# Patient Record
Sex: Female | Born: 1945
Health system: Southern US, Community
[De-identification: ages and names within clinical notes are randomized; demographics above are authoritative.]

## PROBLEM LIST (undated history)

## (undated) DIAGNOSIS — R112 Nausea with vomiting, unspecified: Secondary | ICD-10-CM

## (undated) DIAGNOSIS — Z9889 Other specified postprocedural states: Secondary | ICD-10-CM

## (undated) DIAGNOSIS — M1990 Unspecified osteoarthritis, unspecified site: Secondary | ICD-10-CM

## (undated) DIAGNOSIS — M1A9XX0 Chronic gout, unspecified, without tophus (tophi): Secondary | ICD-10-CM

## (undated) DIAGNOSIS — K900 Celiac disease: Secondary | ICD-10-CM

## (undated) DIAGNOSIS — J45909 Unspecified asthma, uncomplicated: Secondary | ICD-10-CM

## (undated) DIAGNOSIS — E785 Hyperlipidemia, unspecified: Secondary | ICD-10-CM

## (undated) DIAGNOSIS — I6200 Nontraumatic subdural hemorrhage, unspecified: Secondary | ICD-10-CM

## (undated) DIAGNOSIS — A048 Other specified bacterial intestinal infections: Secondary | ICD-10-CM

## (undated) DIAGNOSIS — I10 Essential (primary) hypertension: Secondary | ICD-10-CM

## (undated) DIAGNOSIS — M818 Other osteoporosis without current pathological fracture: Secondary | ICD-10-CM

## (undated) DIAGNOSIS — E119 Type 2 diabetes mellitus without complications: Secondary | ICD-10-CM

## (undated) DIAGNOSIS — E7800 Pure hypercholesterolemia, unspecified: Secondary | ICD-10-CM

## (undated) DIAGNOSIS — E1169 Type 2 diabetes mellitus with other specified complication: Secondary | ICD-10-CM

## (undated) DIAGNOSIS — E039 Hypothyroidism, unspecified: Secondary | ICD-10-CM

## (undated) DIAGNOSIS — E1140 Type 2 diabetes mellitus with diabetic neuropathy, unspecified: Secondary | ICD-10-CM

## (undated) HISTORY — PX: TONSILLECTOMY: SUR1361

## (undated) HISTORY — PX: KNEE ARTHROSCOPY: SUR90

## (undated) HISTORY — PX: APPENDECTOMY (OPEN): SHX54

## (undated) HISTORY — DX: Other osteoporosis without current pathological fracture: M81.8

## (undated) HISTORY — DX: Unspecified asthma, uncomplicated: J45.909

## (undated) HISTORY — DX: Chronic gout, unspecified, without tophus (tophi): M1A.9XX0

## (undated) HISTORY — PX: CHOLECYSTECTOMY: SHX55

## (undated) HISTORY — PX: OTHER SURGICAL HISTORY: SHX169

## (undated) HISTORY — DX: Nausea with vomiting, unspecified: R11.2

## (undated) HISTORY — PX: FOOT SURGERY: SHX648

## (undated) HISTORY — PX: HYSTERECTOMY: SHX81

## (undated) HISTORY — DX: Essential (primary) hypertension: I10

## (undated) HISTORY — DX: Type 2 diabetes mellitus without complications: E11.9

## (undated) HISTORY — DX: Type 2 diabetes mellitus with diabetic neuropathy, unspecified: E11.40

## (undated) HISTORY — DX: Type 2 diabetes mellitus with other specified complication: E11.69

## (undated) HISTORY — PX: THYROID SURGERY: SHX805

## (undated) HISTORY — DX: Hyperlipidemia, unspecified: E78.5

## (undated) HISTORY — PX: TUBAL LIGATION: SHX77

---

## 2009-12-22 ENCOUNTER — Emergency Department
Admission: EM | Admit: 2009-12-22 | Disposition: A | Discharge status: Home / Self Care | Payer: Self-pay | Source: Emergency Department | Admitting: Emergency Medicine

## 2009-12-23 LAB — CBC AND DIFFERENTIAL
BASOPHILS %: 0.2 % (ref 0.0–2.0)
Baso(Absolute): 0.02 10*3/uL (ref 0.00–0.20)
Eosinophils %: 1.9 % (ref 0.0–6.0)
Eosinophils Absolute: 0.16 10*3/uL (ref 0.10–0.30)
Hematocrit: 41.4 % (ref 27.0–49.5)
Hemoglobin: 14.3 g/dL (ref 11.7–15.5)
Immature Granulocytes #: 0.01 10*3/uL (ref 0.00–0.05)
Immature Granulocytes %: 0.1 % — ABNORMAL HIGH (ref 0.0–0.0)
Lymphocytes Absolute: 3.39 10*3/uL (ref 1.00–4.80)
Lymphocytes Relative: 39.5 % (ref 25.0–55.0)
MCH: 28.1 pg (ref 27.0–34.0)
MCHC: 34.5 g/dL (ref 32.0–36.0)
MCV: 81.3 fL (ref 80–100)
MPV: 9.8 fL (ref 9.0–13.0)
Monocytes Absolute: 0.7 10*3/uL (ref 0.10–1.20)
Monocytes Relative %: 8.2 % — ABNORMAL HIGH (ref 1.0–8.0)
Neutrophils Absolute: 4.31 10*3/uL (ref 1.80–7.70)
Neutrophils Relative %: 50.2 % (ref 49.0–69.0)
Nucleated RBC %: 0 /100WBC (ref 0.0–0.0)
Nucleted RBC #: 0 10*3/uL (ref 0.00–0.00)
Platelets: 310 10*3/uL (ref 150–400)
RBC: 5.09 M/uL (ref 3.80–5.40)
RDW: 13.6 % (ref 11.0–14.0)
WBC: 8.58 10*3/uL (ref 4.80–10.80)

## 2009-12-23 LAB — URIC ACID: Uric acid: 8.5 mg/dL — ABNORMAL HIGH (ref 2.0–6.1)

## 2011-12-07 ENCOUNTER — Ambulatory Visit (INDEPENDENT_AMBULATORY_CARE_PROVIDER_SITE_OTHER): Payer: Medicare Other | Admitting: Family

## 2011-12-07 ENCOUNTER — Encounter (INDEPENDENT_AMBULATORY_CARE_PROVIDER_SITE_OTHER): Payer: Self-pay

## 2011-12-07 VITALS — BP 135/89 | HR 81 | Temp 98.3°F | Resp 19

## 2011-12-07 DIAGNOSIS — J45909 Unspecified asthma, uncomplicated: Secondary | ICD-10-CM

## 2011-12-07 DIAGNOSIS — J45901 Unspecified asthma with (acute) exacerbation: Secondary | ICD-10-CM

## 2011-12-07 MED ORDER — ALBUTEROL SULFATE (2.5 MG/3ML) 0.083% IN NEBU
2.50 mg | INHALATION_SOLUTION | Freq: Once | RESPIRATORY_TRACT | Status: AC
Start: 2011-12-07 — End: ?

## 2011-12-07 MED ORDER — FLUTICASONE PROPIONATE HFA 110 MCG/ACT IN AERO
2.00 | INHALATION_SPRAY | Freq: Two times a day (BID) | RESPIRATORY_TRACT | Status: AC
Start: 2011-12-07 — End: 2012-12-06

## 2011-12-07 MED ORDER — PREDNISONE 50 MG PO TABS
50.00 mg | ORAL_TABLET | Freq: Every day | ORAL | Status: AC
Start: 2011-12-07 — End: 2011-12-12

## 2011-12-07 MED ORDER — IPRATROPIUM BROMIDE 0.02 % IN SOLN
0.50 mg | Freq: Once | RESPIRATORY_TRACT | Status: AC
Start: 2011-12-07 — End: ?

## 2011-12-07 MED ORDER — ALBUTEROL SULFATE HFA 108 (90 BASE) MCG/ACT IN AERS
2.00 | INHALATION_SPRAY | Freq: Four times a day (QID) | RESPIRATORY_TRACT | Status: AC | PRN
Start: 2011-12-07 — End: 2012-12-06

## 2011-12-07 NOTE — Progress Notes (Signed)
Subjective:       Patient ID: Jillian Cordova is a 66 y.o. female.    HPI Comments: Had been staying with family member who smokes cigars. She believes this was a big trigger for her asthma    Shortness of Breath  This is a recurrent (has asthmatic attacks about 1x per year) problem. The current episode started in the past 7 days (saturday and now progressively worse). The problem has been gradually worsening. Associated symptoms include coryza, a sore throat and wheezing. Pertinent negatives include no abdominal pain, chest pain, claudication, ear pain, fever, headaches, hemoptysis, leg pain, leg swelling, neck pain, orthopnea, PND, rash, rhinorrhea, sputum production, swollen glands, syncope or vomiting. The symptoms are aggravated by fumes and pollens. The patient has no known risk factors for DVT/PE. She has tried beta agonist inhalers for the symptoms. The treatment provided mild relief. Her past medical history is significant for allergies and asthma. There is no history of aspirin allergies, bronchiolitis, CAD, chronic lung disease, COPD, DVT, a heart failure, PE, pneumonia or a recent surgery.       The following portions of the patient's history were reviewed and updated as appropriate: allergies, current medications, past family history, past medical history, past social history, past surgical history and problem list.    Review of Systems   Constitutional: Negative.  Negative for fever.   HENT: Positive for sore throat. Negative for ear pain, rhinorrhea and neck pain.    Eyes: Negative.    Respiratory: Positive for shortness of breath and wheezing. Negative for hemoptysis and sputum production.    Cardiovascular: Negative.  Negative for chest pain, orthopnea, claudication, leg swelling, syncope and PND.   Gastrointestinal: Negative.  Negative for vomiting and abdominal pain.   Genitourinary: Negative.    Musculoskeletal: Negative.    Skin: Negative.  Negative for rash.   Neurological: Negative.  Negative for  headaches.   Hematological: Negative.    Psychiatric/Behavioral: Negative.            Objective:    Physical Exam   Constitutional: She is oriented to person, place, and time. She appears well-developed and well-nourished.   HENT:   Head: Normocephalic and atraumatic.   Right Ear: External ear normal.   Left Ear: External ear normal.   Mouth/Throat: Oropharynx is clear and moist.   Eyes: Pupils are equal, round, and reactive to light.   Neck: Normal range of motion.   Cardiovascular: Normal rate, regular rhythm and normal heart sounds.    Pulmonary/Chest: Effort normal and breath sounds normal. No respiratory distress. She has no wheezes. She has no rales. She exhibits no tenderness.        Initially patient was hoarse with some expiratory wheezing in most lung fields.  After combo atrovent/albuterol mdi, BBS clear   Neurological: She is alert and oriented to person, place, and time.   Skin: Skin is warm and dry.   Psychiatric: She has a normal mood and affect. Her behavior is normal. Judgment and thought content normal.           Assessment:        Jillian Cordova was seen today for shortness of breath.    Diagnoses and associated orders for this visit:    Asthma attack  - predniSONE (DELTASONE) 50 MG tablet; Take 1 tablet (50 mg total) by mouth daily.  - fluticasone (FLOVENT HFA) 110 MCG/ACT inhaler; Inhale 2 puffs into the lungs 2 (two) times daily.  - albuterol (PROVENTIL HFA) 108 (  90 BASE) MCG/ACT inhaler; Inhale 2 puffs into the lungs every 6 (six) hours as needed for Wheezing.    Other Orders  - lisinopril (PRINIVIL,ZESTRIL) 20 MG tablet; Take 20 mg by mouth daily.  - citalopram (CELEXA) 20 MG tablet; Take 20 mg by mouth daily.  - pravastatin (PRAVACHOL) 40 MG tablet; Take 40 mg by mouth daily.  - fexofenadine (ALLEGRA) 60 MG tablet; Take 60 mg by mouth 2 (two) times daily.  - hydrochlorothiazide (HYDRODIURIL) 12.5 MG tablet; Take 12.5 mg by mouth daily.  - allopurinol (ZYLOPRIM) 300 MG tablet; Take 300 mg by mouth  daily.  - aspirin 81 MG tablet; Take 81 mg by mouth daily.  - amLODIPine (NORVASC) 2.5 MG tablet; Take 2.5 mg by mouth daily.            Plan:       1.  Meds as rx, avoid smoke    2.  F/u with pcp

## 2011-12-07 NOTE — Patient Instructions (Signed)
ASTHMA [Adult]  Asthma is a disease where the small air passages within the lung go into spasm and restrict the flow of air. Inflammation and swelling of the airways cause further restriction. During an acute asthma attack, these factors cause difficulty breathing, wheezing, cough and chest tightness.    An asthma attack can be triggered by many things. Common triggers include the common cold, bronchitis, pneumonia, emotional upset and heavy exercise. In about half of adults with asthma, allergies to smoke, pollutants in the air, dust, mold, pollen and animal dander can cause an asthma attack. Skipping doses of daily asthma medicine can also bring on an asthma attack.  Asthma can be controlled with proper medicines and decreased exposure to known allergens.  HOME CARE:   Drink lots of water or other fluids (at least 10 glasses a day) during an attack. This will loosen lung secretions and make it easier to breathe. If you have heart or kidney disease, check with your doctor before you drink extra amounts of fluids.   Take prescribed medicine exactly at the times advised. If you have a hand-held inhaler or aerosol breathing medicine, do not use it more than once every four hours, unless told to do so. If prescribed an antibiotic or prednisone, take all of the medicine even if you are feeling better after a few days.   Do not smoke. Avoid being exposed to the smoke of others.   Some persons with asthma are allergic to aspirin and non-steroidal medicines like ibuprofen (Motrin, Advil) and naproxen (Aleve, Naprosyn). Use these with caution. Acetaminophen (Tylenol) is safe to use.  FOLLOW UP with your doctor, or as advised by our staff. Always bring all of your current medicines with you for your doctor to see.  [NOTE: If you are age 65 or older, or if you have chronic asthma or COPD, we recommend a PNEUMOCOCCAL VACCINATION every five years and a yearly INFLUENZAVACCINATION (FLU SHOT) every autumn. Ask your doctor  about this. If you had an X-ray or EKG (cardiogram), it will be reviewed by a specialist. You will be notified of any new findings that may affect your care.]  GET PROMPT MEDICAL ATTENTION if any of the following occur:   Increased wheezing or shortness of breath   Need to use your inhalers more often than usual without relief   Fever of 100.4F (38C) or higher, or as directed by your healthcare provider   Coughing up lots of dark-colored or bloody sputum (mucus)   Chest pain with each breath   You do not start to improve within 24 hours   2000-2011 [employer], [address], [place] [address]. All rights reserved. This information is not intended as a substitute for professional medical care. Always follow your healthcare professional's instructions.

## 2012-12-11 MED ORDER — ONDANSETRON HCL 4 MG/2ML IJ SOLN
INTRAMUSCULAR | Status: AC
Start: 2012-12-11 — End: ?
  Filled 2012-12-11: qty 2

## 2012-12-11 MED ORDER — PROPOFOL 10 MG/ML IV EMUL
INTRAVENOUS | Status: AC
Start: 2012-12-11 — End: ?
  Filled 2012-12-11: qty 20

## 2012-12-11 MED ORDER — ROCURONIUM BROMIDE 50 MG/5ML IV SOLN
INTRAVENOUS | Status: AC
Start: 2012-12-11 — End: ?
  Filled 2012-12-11: qty 5

## 2012-12-11 MED ORDER — METOCLOPRAMIDE HCL 5 MG/ML IJ SOLN
INTRAMUSCULAR | Status: AC
Start: 2012-12-11 — End: ?
  Filled 2012-12-11: qty 2

## 2012-12-11 MED ORDER — DEXAMETHASONE SODIUM PHOSPHATE 4 MG/ML IJ SOLN (WRAP)
INTRAMUSCULAR | Status: AC
Start: 2012-12-11 — End: ?
  Filled 2012-12-11: qty 2

## 2012-12-11 MED ORDER — FENTANYL CITRATE 0.05 MG/ML IJ SOLN
INTRAMUSCULAR | Status: AC
Start: 2012-12-11 — End: ?
  Filled 2012-12-11: qty 2

## 2012-12-11 MED ORDER — MIDAZOLAM HCL 2 MG/2ML IJ SOLN
INTRAMUSCULAR | Status: AC
Start: 2012-12-11 — End: ?
  Filled 2012-12-11: qty 2

## 2012-12-13 ENCOUNTER — Ambulatory Visit: Payer: Medicare (Managed Care)

## 2012-12-18 ENCOUNTER — Encounter: Payer: Self-pay | Admitting: Acute Care

## 2012-12-18 ENCOUNTER — Ambulatory Visit: Payer: Self-pay

## 2012-12-18 ENCOUNTER — Encounter
Admission: RE | Disposition: A | Discharge status: Home / Self Care | Payer: Self-pay | Source: Ambulatory Visit | Attending: Gastroenterology

## 2012-12-18 ENCOUNTER — Ambulatory Visit: Payer: Medicare Other | Admitting: Gastroenterology

## 2012-12-18 ENCOUNTER — Ambulatory Visit: Payer: Medicare (Managed Care) | Admitting: Acute Care

## 2012-12-18 ENCOUNTER — Ambulatory Visit
Admission: RE | Admit: 2012-12-18 | Discharge: 2012-12-18 | Disposition: A | Discharge status: Home / Self Care | Payer: Medicare (Managed Care) | Source: Ambulatory Visit | Attending: Gastroenterology | Admitting: Gastroenterology

## 2012-12-18 DIAGNOSIS — M810 Age-related osteoporosis without current pathological fracture: Secondary | ICD-10-CM | POA: Insufficient documentation

## 2012-12-18 DIAGNOSIS — K219 Gastro-esophageal reflux disease without esophagitis: Secondary | ICD-10-CM | POA: Insufficient documentation

## 2012-12-18 DIAGNOSIS — J45909 Unspecified asthma, uncomplicated: Secondary | ICD-10-CM | POA: Insufficient documentation

## 2012-12-18 DIAGNOSIS — K620 Anal polyp: Secondary | ICD-10-CM | POA: Insufficient documentation

## 2012-12-18 DIAGNOSIS — I10 Essential (primary) hypertension: Secondary | ICD-10-CM | POA: Insufficient documentation

## 2012-12-18 DIAGNOSIS — E785 Hyperlipidemia, unspecified: Secondary | ICD-10-CM | POA: Insufficient documentation

## 2012-12-18 DIAGNOSIS — K921 Melena: Secondary | ICD-10-CM | POA: Insufficient documentation

## 2012-12-18 DIAGNOSIS — K648 Other hemorrhoids: Secondary | ICD-10-CM | POA: Insufficient documentation

## 2012-12-18 HISTORY — DX: Other specified postprocedural states: Z98.890

## 2012-12-18 HISTORY — PX: COLONOSCOPY: SHX174

## 2012-12-18 HISTORY — DX: Hyperlipidemia, unspecified: E78.5

## 2012-12-18 HISTORY — DX: Unspecified osteoarthritis, unspecified site: M19.90

## 2012-12-18 SURGERY — DONT USE, USE 1094-COLONOSCOPY, DIAGNOSTIC (SCREENING)
Anesthesia: Anesthesia General | Site: Abdomen | Wound class: Clean Contaminated

## 2012-12-18 MED ORDER — LIDOCAINE HCL 2 % IJ SOLN
INTRAMUSCULAR | Status: AC
Start: 2012-12-18 — End: ?
  Filled 2012-12-18: qty 20

## 2012-12-18 MED ORDER — MIDAZOLAM HCL 2 MG/2ML IJ SOLN
INTRAMUSCULAR | Status: DC | PRN
Start: 2012-12-18 — End: 2012-12-18
  Administered 2012-12-18 (×2): 1 mg via INTRAVENOUS

## 2012-12-18 MED ORDER — LIDOCAINE HCL 2 % IJ SOLN
INTRAMUSCULAR | Status: DC | PRN
Start: 2012-12-18 — End: 2012-12-18
  Administered 2012-12-18: 50 mg

## 2012-12-18 MED ORDER — FENTANYL CITRATE 0.05 MG/ML IJ SOLN
INTRAMUSCULAR | Status: DC | PRN
Start: 2012-12-18 — End: 2012-12-18
  Administered 2012-12-18 (×2): 50 ug via INTRAVENOUS

## 2012-12-18 MED ORDER — PROPOFOL 10 MG/ML IV EMUL
INTRAVENOUS | Status: AC
Start: 2012-12-18 — End: ?
  Filled 2012-12-18: qty 20

## 2012-12-18 MED ORDER — PROPOFOL 10 MG/ML IV EMUL
INTRAVENOUS | Status: DC | PRN
Start: 2012-12-18 — End: 2012-12-18
  Administered 2012-12-18: 50 mg via INTRAVENOUS
  Administered 2012-12-18: 60 mg via INTRAVENOUS
  Administered 2012-12-18: 50 mg via INTRAVENOUS

## 2012-12-18 MED ORDER — MIDAZOLAM HCL 2 MG/2ML IJ SOLN
INTRAMUSCULAR | Status: AC
Start: 2012-12-18 — End: ?
  Filled 2012-12-18: qty 2

## 2012-12-18 MED ORDER — LACTATED RINGERS IV SOLN
INTRAVENOUS | Status: DC
Start: 2012-12-18 — End: 2012-12-18

## 2012-12-18 MED ORDER — FENTANYL CITRATE 0.05 MG/ML IJ SOLN
INTRAMUSCULAR | Status: AC
Start: 2012-12-18 — End: ?
  Filled 2012-12-18: qty 2

## 2012-12-18 SURGICAL SUPPLY — 17 items
FORCEPS BIOPSY L240 CM JUMBO MICROMESH (Instrument) ×1 IMPLANT
FORCEPS BIOPSY L240 CM JUMBO MICROMESH TEETH STREAMLINE CATHETER (Instrument) IMPLANT
FORCEPS BX SS JMB RJ 4 2.8MM 240CM STRL (Instrument) ×1
GLOVES EXAM NITRILE ETS LG NS (Glove) ×4 IMPLANT
GOWN ISL PP PE REG LG LF FULL BCK NK TIE (Gown) ×2 IMPLANT
GOWN ISOLATION REGULAR LARGE FULL BACK NECK TIE ELASTIC CUFF (Gown) ×2 IMPLANT
MASK FLUID SHIELD W WRAP (Personal Protection) ×2 IMPLANT
SOLN LUBRICATING JELLY 4.25OZ (Irrigation Solutions) ×2 IMPLANT
SPONGE GAUZE L4 IN X W4 IN 16 PLY (Dressing) ×1 IMPLANT
SPONGE GAUZE L4 IN X W4 IN 16 PLY MAXIMUM ABSORBENT USP TYPE VII (Dressing) ×1 IMPLANT
SPONGE GZE CTTN CRTY 4X4IN LF NS 16 PLY (Dressing) ×1
SYRINGE 50 ML GRADUATE NONPYROGENIC DEHP (Syringes, Needles) ×1 IMPLANT
SYRINGE 50 ML GRADUATE NONPYROGENIC DEHP FREE PVC FREE BD MEDICAL (Syringes, Needles) ×1 IMPLANT
SYRINGE MED 50ML LF STRL GRAD N-PYRG (Syringes, Needles) ×1
WATER STERILE PLASTIC POUR BOTTLE 1000 (Irrigation Solutions) ×1 IMPLANT
WATER STERILE PLASTIC POUR BOTTLE 1000 ML (Irrigation Solutions) ×1 IMPLANT
WATER STRL 1000ML PLS PR BTL LF (Irrigation Solutions) ×1

## 2012-12-18 NOTE — Anesthesia Postprocedure Evaluation (Signed)
Anesthesia Post Evaluation    Patient: Jillian Cordova    Procedures performed: Procedure(s) with comments:  COLONOSCOPY - COLONOSCOPY  ANES=MAC; Q1=N; BB=N,172    Anesthesia type: General TIVA    Patient location:Phase II PACU    Last vitals:   Filed Vitals:    12/18/12 1357   BP: 151/77   Pulse: 72   Temp: 98.9 F (37.2 C)   Resp: 18   SpO2: 96%       Post pain: Patient not complaining of pain, continue current therapy      Mental Status:awake    Respiratory Function: tolerating room air    Cardiovascular: stable    Nausea/Vomiting: patient not complaining of nausea or vomiting    Hydration Status: adequate    Post assessment: no apparent anesthetic complications

## 2012-12-18 NOTE — H&P (Signed)
GI PRE PROCEDURE NOTE    Proceduralist Comments:   Review of Systems and Past Medical / Surgical History performed: Yes     Indications:Gastrointestinal bleeding    Previous Adverse Reaction to Anesthesia or Sedation (if yes, describe): No    Physical Exam / Laboratory Data (If applicable)   Airway Classification: Class II    General: Alert and cooperative  Lungs: Lungs clear to auscultation  Cardiac: RRR, normal S1S2.    Abdomen: Soft, non tender. Normal active bowel sounds  Other:     No labs drawn    American Society of Anesthesiologists (ASA) Physical Status Classification:   ASA 2 - Patient with mild systemic disease with no functional limitations    Planned Sedation:   Deep sedation with anesthesia    Attestation:   Jillian Cordova has been reassessed immediately prior to the procedure and is an appropriate candidate for the planned sedation and procedure. Risks, benefits and alternatives to the planned procedure and sedation have been explained to the patient or guardian:  yes        Signed by: Lilia Pro

## 2012-12-18 NOTE — Transfer of Care (Signed)
Anesthesia Transfer of Care Note    Patient: Jillian Cordova    Procedures performed: Procedure(s) with comments:  COLONOSCOPY - COLONOSCOPY  ANES=MAC; Q1=N; BB=N,172    Anesthesia type: General TIVA    Patient location:Phase II PACU    Last vitals:   Filed Vitals:    12/18/12 1357   BP: 151/77   Pulse: 72   Temp: 98.9 F (37.2 C)   Resp: 18   SpO2: 96%       Post pain: Patient not complaining of pain, continue current therapy      Mental Status:awake    Respiratory Function: tolerating room air    Cardiovascular: stable    Nausea/Vomiting: patient not complaining of nausea or vomiting    Hydration Status: adequate    Post assessment: no apparent anesthetic complications

## 2012-12-18 NOTE — Anesthesia Preprocedure Evaluation (Addendum)
Anesthesia Evaluation    AIRWAY    Mallampati: III    TM distance: <3 FB  Neck ROM: limited  Mouth Opening:limited   CARDIOVASCULAR    cardiovascular exam normal, regular and normal       DENTAL    No notable dental hx     PULMONARY    pulmonary exam normal     OTHER FINDINGS                  Pre-evaluation Note Incomplete - DO NOT USE FOR CLINICAL DECISIONS    Anesthesia Plan    ASA 3   general                   intravenous induction

## 2012-12-18 NOTE — Discharge Instructions (Signed)
Colonoscopy Discharge Instructions  General Instructions:  1. Following sedation, your judgement, perception, and coordination are considered impaired. Even though you may feel awake and alert, you are considered legally intoxicated. Therefore, until the next morning;   Do not Drive   Do not operate appliances or equipment that requires reaction time (e.g.Stove, electrical tools, machinery)   Do not sign legal documents or be involved in important decisions.   Do not smoke if alone   Do not drink alcoholic beverages   Go directly home and rest for several hours before resuming your routine activities.   It is highly recommended to have a responsible adult stay with you for the next 24 hours    2. Tenderness, swelling or pain may occur at the IV site where you received sedation. If you experience this, apply warm soaks to the area. Notify your physician if this persists.    Instructions Specific To Procedures - Report To Physician Any Of The Following:    Colon/Sigmoidoscopy/Proctoscopy   1. Severe and persistent abdominal pain/bloating which does not subside within 2-3 hours   2. Large amount of rectal bleeding (some mucosal blood streaking may occur, especially if biopsy or polypectomy was done or if hemorrhoids are present.   3. Nausea/vomitting   4. Fevers/Chills within 24 hours after procedure. Temp>101deg F    If you have questions or problems contact your MD immediately. If you need immediate attention, call your MD, 911 and/or go to nearest emergency room.

## 2012-12-19 ENCOUNTER — Encounter: Payer: Self-pay | Admitting: Gastroenterology

## 2013-01-08 NOTE — Anesthesia Preprocedure Evaluation (Addendum)
Anesthesia Evaluation    AIRWAY    Mallampati: III    TM distance: <3 FB  Neck ROM: limited  Mouth Opening:limited   CARDIOVASCULAR    cardiovascular exam normal, regular and normal       DENTAL    No notable dental hx     PULMONARY    pulmonary exam normal     OTHER FINDINGS                      Anesthesia Plan    ASA 3     general                     intravenous induction   Detailed anesthesia plan: general IV  Monitors/Adjuncts: other    Post Op: other  Post op pain management: per surgeon    informed consent obtained    Plan discussed with CRNA.

## 2013-01-09 ENCOUNTER — Encounter: Payer: Self-pay | Admitting: Acute Care

## 2013-01-09 ENCOUNTER — Encounter
Admission: RE | Disposition: A | Discharge status: Home / Self Care | Payer: Self-pay | Source: Ambulatory Visit | Attending: Gastroenterology

## 2013-01-09 ENCOUNTER — Ambulatory Visit: Payer: Medicare (Managed Care) | Admitting: Acute Care

## 2013-01-09 ENCOUNTER — Ambulatory Visit: Payer: Medicare Other | Admitting: Gastroenterology

## 2013-01-09 ENCOUNTER — Ambulatory Visit
Admission: RE | Admit: 2013-01-09 | Discharge: 2013-01-09 | Disposition: A | Discharge status: Home / Self Care | Payer: Medicare (Managed Care) | Source: Ambulatory Visit | Attending: Gastroenterology | Admitting: Gastroenterology

## 2013-01-09 DIAGNOSIS — I10 Essential (primary) hypertension: Secondary | ICD-10-CM | POA: Insufficient documentation

## 2013-01-09 DIAGNOSIS — Z7982 Long term (current) use of aspirin: Secondary | ICD-10-CM | POA: Insufficient documentation

## 2013-01-09 DIAGNOSIS — K2980 Duodenitis without bleeding: Secondary | ICD-10-CM | POA: Insufficient documentation

## 2013-01-09 DIAGNOSIS — K219 Gastro-esophageal reflux disease without esophagitis: Secondary | ICD-10-CM | POA: Insufficient documentation

## 2013-01-09 DIAGNOSIS — Z79899 Other long term (current) drug therapy: Secondary | ICD-10-CM | POA: Insufficient documentation

## 2013-01-09 DIAGNOSIS — A048 Other specified bacterial intestinal infections: Secondary | ICD-10-CM | POA: Insufficient documentation

## 2013-01-09 DIAGNOSIS — K449 Diaphragmatic hernia without obstruction or gangrene: Secondary | ICD-10-CM | POA: Insufficient documentation

## 2013-01-09 DIAGNOSIS — K2990 Gastroduodenitis, unspecified, without bleeding: Secondary | ICD-10-CM | POA: Insufficient documentation

## 2013-01-09 DIAGNOSIS — E785 Hyperlipidemia, unspecified: Secondary | ICD-10-CM | POA: Insufficient documentation

## 2013-01-09 DIAGNOSIS — J45909 Unspecified asthma, uncomplicated: Secondary | ICD-10-CM | POA: Insufficient documentation

## 2013-01-09 HISTORY — PX: EGD: SHX3789

## 2013-01-09 SURGERY — DONT USE, USE 1095-ESOPHAGOGASTRODUODENOSCOPY (EGD), DIAGNOSTIC
Anesthesia: Anesthesia General | Site: Abdomen | Wound class: Clean Contaminated

## 2013-01-09 MED ORDER — SODIUM CHLORIDE 0.9 % IV SOLN
INTRAVENOUS | Status: DC
Start: 2013-01-09 — End: 2013-01-09

## 2013-01-09 MED ORDER — LACTATED RINGERS IV SOLN
INTRAVENOUS | Status: DC
Start: 2013-01-09 — End: 2013-01-09

## 2013-01-09 MED ORDER — FENTANYL CITRATE 0.05 MG/ML IJ SOLN
INTRAMUSCULAR | Status: AC
Start: 2013-01-09 — End: ?
  Filled 2013-01-09: qty 2

## 2013-01-09 MED ORDER — PROPOFOL 10 MG/ML IV EMUL
INTRAVENOUS | Status: DC | PRN
Start: 2013-01-09 — End: 2013-01-09
  Administered 2013-01-09: 40 mg via INTRAVENOUS

## 2013-01-09 MED ORDER — LIDOCAINE HCL 2 % IJ SOLN
INTRAMUSCULAR | Status: AC
Start: 2013-01-09 — End: ?
  Filled 2013-01-09: qty 20

## 2013-01-09 MED ORDER — PROPOFOL 10 MG/ML IV EMUL
INTRAVENOUS | Status: AC
Start: 2013-01-09 — End: ?
  Filled 2013-01-09: qty 20

## 2013-01-09 MED ORDER — FENTANYL CITRATE 0.05 MG/ML IJ SOLN
INTRAMUSCULAR | Status: DC | PRN
Start: 2013-01-09 — End: 2013-01-09
  Administered 2013-01-09: 100 ug via INTRAVENOUS

## 2013-01-09 SURGICAL SUPPLY — 33 items
BLOCK BITE MAXI 60FR LF STRD STRAP SDPRT (Procedure Accessories) ×1
BLOCK BITE OD60 FR STURDY STRAP SIDEPORT (Procedure Accessories) ×1 IMPLANT
BLOCK BITE OD60 FR STURDY STRAP SIDEPORT DENTAL RETENTION RIM MAXI (Procedure Accessories) ×1 IMPLANT
CATHETER BALLOON DILATATION CRE 2.8 MM (Dilator) IMPLANT
CATHETER BALLOON DILATATION CRE PEBAX (Balloon) IMPLANT
CATHETER OD15-16.5-18 MM ODSEC6 FR L180 CM CRE BALLOON DILATATION L8 (Dilator) IMPLANT
CATHETER OD15-16.5-18 MM ODSEC6 FR L180 CM CREâ„¢ BALLOON DILATATION L8 (Dilator) IMPLANT
CATHETER OD6 FR ODSEC12-13.5-15 MM L180 CM CRE BALLOON DILATATION L8 (Balloon) IMPLANT
CATHETER OD6 FR ODSEC12-13.5-15 MM L180 CM CREâ„¢ BALLOON DILATATION L8 (Balloon) IMPLANT
DILATOR ESCP PEBAX 2.8MM CRE 15-16.5-18 (Dilator)
DILATOR ESCP PEBAX CRE 6FR 12-13.5-15MM (Balloon) IMPLANT
FORCEPS BIOPSY L240 CM LARGE CAPACITY (Instrument) ×1 IMPLANT
FORCEPS BIOPSY L240 CM MICROMESH TEETH STREAMLINE CATHETER NEEDLE (Instrument) ×1 IMPLANT
FORCEPS BX SS LG CPC RJ 4 2.4MM 240CM (Instrument) ×1
GOWN ISL PP PE REG LG LF FULL BCK NK TIE (Gown) IMPLANT
GOWN ISOLATION REGULAR LARGE FULL BACK NECK TIE ELASTIC CUFF (Gown) ×2 IMPLANT
MASK FLUID SHIELD W WRAP (Personal Protection) ×3 IMPLANT
NEEDLE CARR-LOCKE INJECT 25GX5 (Needles) IMPLANT
PAD ELECTROSRG GRND REM W CRD (Procedure Accessories) IMPLANT
SNARE ESCP MIC CPTVTR 13MM 240IN STRL (GE Lab Supplies) IMPLANT
SNARE SMALL HEXAGON CAPTIVATOR STIFF ENDOSCOPIC POLYPECTOMY (GE Lab Supplies) IMPLANT
SOLN LUBRICATING JELLY 4.25OZ (Irrigation Solutions) ×2 IMPLANT
SPONGE GAUZE L4 IN X W4 IN 16 PLY (Dressing) ×1 IMPLANT
SPONGE GAUZE L4 IN X W4 IN 16 PLY MAXIMUM ABSORBENT USP TYPE VII (Dressing) ×1 IMPLANT
SPONGE GZE CTTN CRTY 4X4IN LF NS 16 PLY (Dressing) ×1
SYRINGE 50 ML GRADUATE NONPYROGENIC DEHP (Syringes, Needles) ×1 IMPLANT
SYRINGE 50 ML GRADUATE NONPYROGENIC DEHP FREE PVC FREE BD MEDICAL (Syringes, Needles) ×1 IMPLANT
SYRINGE INFL 60ML ALN II STRL GA DISP (Syringes, Needles)
SYRINGE INFLATION 60 ML GAUGE CRE (Syringes, Needles) IMPLANT
SYRINGE MED 50ML LF STRL GRAD N-PYRG (Syringes, Needles) ×1
WATER STERILE PLASTIC POUR BOTTLE 1000 (Irrigation Solutions) ×1 IMPLANT
WATER STERILE PLASTIC POUR BOTTLE 1000 ML (Irrigation Solutions) ×1 IMPLANT
WATER STRL 1000ML PLS PR BTL LF (Irrigation Solutions) ×1

## 2013-01-09 NOTE — H&P (Signed)
GI PRE PROCEDURE NOTE    Proceduralist Comments:   Review of Systems and Past Medical / Surgical History performed: Yes     Indications:GERD     Previous Adverse Reaction to Anesthesia or Sedation (if yes, describe): No    Physical Exam / Laboratory Data (If applicable)   Airway Classification: Class II    General: Alert and cooperative  Lungs: Lungs clear to auscultation  Cardiac: RRR, normal S1S2.    Abdomen: Soft, non tender. Normal active bowel sounds  Other:     No labs drawn    American Society of Anesthesiologists (ASA) Physical Status Classification:   ASA 2 - Patient with mild systemic disease with no functional limitations    Planned Sedation:   Deep sedation with anesthesia    Attestation:   Jillian Cordova has been reassessed immediately prior to the procedure and is an appropriate candidate for the planned sedation and procedure. Risks, benefits and alternatives to the planned procedure and sedation have been explained to the patient or guardian:  yes        Signed by: Lilia Pro

## 2013-01-09 NOTE — Anesthesia Postprocedure Evaluation (Signed)
Anesthesia Post Evaluation    Patient: Jillian Cordova    Procedures performed: Procedure(s) with comments:  EGD - EGD  ANES=MAC; Q1=N; BB=N,172    Anesthesia type: General TIVA    Patient location:Phase II PACU    Last vitals:   Filed Vitals:    01/09/13 1325   BP: 147/85   Pulse: 75   Temp: 98.2 F (36.8 C)   Resp: 18   SpO2: 97%       Post pain: Patient not complaining of pain, continue current therapy      Mental Status:awake    Respiratory Function: tolerating room air    Cardiovascular: stable    Nausea/Vomiting: patient not complaining of nausea or vomiting    Hydration Status: adequate    Post assessment: no apparent anesthetic complications

## 2013-01-09 NOTE — Discharge Instructions (Signed)
Endoscopy Discharge Instructions  General Instructions:  1. Following sedation, your judgement, perception, and coordination are considered impaired. Even though you may feel awake and alert, you are considered legally intoxicated. Therefore, until the next morning;   Do not Drive   Do not operate appliances or equipment that requires reaction time (e.g. stove, electrical tools, machinery)   Do not sign legal documents or be involved in important decisions.   Do not smoke if alone   Do not drink alcoholic beverages   Go directly home and rest for several hours before resuming your routine activities.   It is highly recommended to have a responsible adult stay with you for the next 24 hours    2. Tenderness, swelling or pain may occur at the IV site where you received sedation. If you experience this, apply warm soaks to the area. Notify your physician if this persists.    Instructions Specific To Procedures - Report To Physician Any Of The Following:    Upper Endoscopy, ERCP, Dilations   1. Pain in Chest   2. Nausea/vomitting   3. Fevers/Chills within 24 hours after procedure. Temp>101deg F   4. Severe and persistent abdominal pain and bloating     In Addition:   Mild throat soreness may follow this procedure. Warm salt water gargling or    lozenges of your choice will most likely relieve your discomfort or cold drinks and   popsicles.      :             Additional Discharge Instructions  Your diet after the procedure: {Gi diet instructions:110015}  Special Instructions: ***  Prescriptions given: {YES (DEF)/NO:21773}  Patient education literature given; {YES (DEF)/NO:21773}      If you have questions or problems contact your MD immediately. If you need immediate attention, call your MD, 911 and/or go to nearest emergency room.

## 2013-01-09 NOTE — Transfer of Care (Signed)
22Anesthesia Transfer of Care Note    Patient: Jillian Cordova    Procedures performed: Procedure(s) with comments:  EGD - EGD  ANES=MAC; Q1=N; BB=N,172    Anesthesia type: General TIVA    Patient location:Phase II PACU    Last vitals:   Filed Vitals:    01/09/13 1325   BP: 147/85   Pulse: 75   Temp: 98.2 F (36.8 C)   Resp: 18   SpO2: 97%       Post pain: Patient not complaining of pain, continue current therapy      Mental Status:awake    Respiratory Function: tolerating room air    Cardiovascular: stable    Nausea/Vomiting: patient not complaining of nausea or vomiting    Hydration Status: adequate    Post assessment: no apparent anesthetic complications

## 2013-01-10 ENCOUNTER — Encounter: Payer: Self-pay | Admitting: Gastroenterology

## 2013-12-14 ENCOUNTER — Encounter (INDEPENDENT_AMBULATORY_CARE_PROVIDER_SITE_OTHER): Payer: Self-pay | Admitting: Family Medicine

## 2013-12-14 ENCOUNTER — Ambulatory Visit (INDEPENDENT_AMBULATORY_CARE_PROVIDER_SITE_OTHER): Payer: Medicare (Managed Care) | Admitting: Family Medicine

## 2013-12-14 VITALS — BP 173/92 | HR 80 | Temp 98.5°F | Resp 16 | Ht 61.0 in | Wt 185.0 lb

## 2013-12-14 DIAGNOSIS — I10 Essential (primary) hypertension: Secondary | ICD-10-CM

## 2013-12-14 DIAGNOSIS — R062 Wheezing: Secondary | ICD-10-CM

## 2013-12-14 DIAGNOSIS — R059 Cough, unspecified: Secondary | ICD-10-CM

## 2013-12-14 MED ORDER — ALBUTEROL SULFATE (2.5 MG/3ML) 0.083% IN NEBU
2.50 mg | INHALATION_SOLUTION | RESPIRATORY_TRACT | Status: AC | PRN
Start: 2013-12-14 — End: 2014-12-14

## 2013-12-14 MED ORDER — GUAIFENESIN-CODEINE 100-10 MG/5ML PO SYRP
5.00 mL | ORAL_SOLUTION | Freq: Three times a day (TID) | ORAL | Status: AC | PRN
Start: 2013-12-14 — End: ?

## 2013-12-14 MED ORDER — AMLODIPINE BESYLATE 5 MG PO TABS
5.0000 mg | ORAL_TABLET | Freq: Every day | ORAL | Status: AC
Start: 2013-12-14 — End: ?

## 2013-12-14 NOTE — Progress Notes (Signed)
Subjective:       Patient ID: Jillian Cordova is a 68 y.o. female.    Hypertension  This is a chronic problem. The current episode started more than 1 year ago. The problem has been gradually worsening since onset. The problem is uncontrolled. Associated symptoms include headaches, malaise/fatigue and shortness of breath. Pertinent negatives include no anxiety, blurred vision, chest pain, neck pain, orthopnea, palpitations, peripheral edema, PND or sweats. Agents associated with hypertension include decongestants. Risk factors for coronary artery disease include obesity and post-menopausal state. Past treatments include ACE inhibitors. The current treatment provides mild improvement. There are no compliance problems.  There is no history of angina, kidney disease, CAD/MI, CVA, heart failure, left ventricular hypertrophy, PVD or retinopathy.   Cough  This is a new problem. The current episode started in the past 7 days. The problem has been gradually worsening. The problem occurs every few minutes. The cough is non-productive. Associated symptoms include headaches, myalgias, nasal congestion, shortness of breath and wheezing. Pertinent negatives include no chest pain, ear congestion, ear pain or sweats. The symptoms are aggravated by dust and pollens. She has tried a beta-agonist inhaler for the symptoms. The treatment provided mild relief. Her past medical history is significant for asthma and bronchitis. There is no history of bronchiectasis, COPD, emphysema or pneumonia.       The following portions of the patient's history were reviewed and updated as appropriate: allergies, current medications, past family history, past medical history, past social history, past surgical history and problem list.    Review of Systems   Constitutional: Positive for malaise/fatigue.   HENT: Negative for ear pain.    Eyes: Negative for blurred vision.   Respiratory: Positive for cough, shortness of breath and wheezing.     Cardiovascular: Negative for chest pain, palpitations, orthopnea and PND.   Musculoskeletal: Positive for myalgias. Negative for neck pain.   Neurological: Positive for headaches.           Objective:     Physical Exam   Vitals reviewed.  Constitutional: She is oriented to person, place, and time.   HENT:   Right Ear: Hearing and tympanic membrane normal.   Left Ear: Hearing and tympanic membrane normal.   Nose: Mucosal edema and rhinorrhea present.   Cardiovascular: Normal rate, regular rhythm and normal heart sounds.    No murmur heard.  Pulmonary/Chest: Effort normal. She has wheezes.   Abdominal: Soft. Bowel sounds are normal.   Musculoskeletal: Normal range of motion.   Neurological: She is alert and oriented to person, place, and time.   Skin: Skin is warm and dry. No erythema.     BP 173/92   Pulse 80   Temp(Src) 98.5 F (36.9 C) (Tympanic)   Resp 16   Ht 1.549 m (5\' 1" )   Wt 83.915 kg (185 lb)   BMI 34.97 kg/m2            Assessment:       1. HTN (hypertension)  amLODIPine (NORVASC) 5 MG tablet   2. Cough  guaiFENesin-codeine (ROBITUSSIN AC) 100-10 MG/5ML syrup   3. Wheezing  albuterol (PROVENTIL) (2.5 MG/3ML) 0.083% nebulizer solution            Plan:       1) HTN  Poorly controlled  EKG normal   Added Amlodipine 5 mg po daily   Low salt diet c/w monitor  Follow up with PCP on Tuesday     2) Cough with wheezing  Allergy related   C/w neb   Advice to just take antihistamine/ no decongestants  If the symptoms persists or worsens to come back or see PCP

## 2013-12-14 NOTE — Patient Instructions (Signed)
High Blood Pressure --Established    High Blood Pressure (Hypertension) is a chronic disease. The cause is unknown in most cases. It can usually be controlled with lifestyle changes and/or medicines. Symptoms of high blood pressure may include headache, dizziness, visual changes, chest pain and shortness of breath. Sometimes it causes no symptoms at all. However, even if there are no symptoms, untreated high blood pressure increases the risk of heart attack, also known as acute myocardial infarction, or AMI, and stroke. It is a serious health risk and should not be ignored.  A normal blood pressure is 120/80 or less. The first (top) number is the "systolic" pressure. The second (bottom) number is the "diastolic" pressure. Hypertension exists when either the top number is 140 or higher, OR the bottom number is 90 or higher on repeated measurements.  Home Care:  All patients with high blood pressure should do the following to lower their pressure. If you are on medicines, then these methods may reduce or eliminate your need for medicines in the future.  1. Begin a weight loss program if you are overweight.  2. Reduce your salt intake.   Avoid high salt foods (olives, pickles, smoked meats, salted potato chips, etc.).   Do not add salt to your food at the table.   Use only small amounts of salt when cooking.  3. Begin an exercise program. Discuss with your doctor what type of exercise program would be best for you. It doesn't have to be difficult. Even brisk walking for 20 minutes three times a week is a good form of exercise.  4. Avoid medicines which contain heart stimulants. This includes many cold and sinus decongestant pills and sprays as well as diet pills. Check the warnings about hypertension on the label. Stimulants such as amphetamine or cocaine could be lethal for someone with hypertension. Never take these.  5. Limit your caffeine intake or switch to caffeine-free products.  6. Stop smoking. If you are  a long-time smoker, this can be hard. Enroll in a stop-smoking program to improve your chance of success.  7. Learning how to handle stress better is an important part of any program to lower blood pressure. Learn about relaxation methods such as meditation, yoga or biofeedback.  8. If medicines were prescribed, take them exactly as directed. Missing doses may cause your blood pressure get out of control.  9. Consider buying an automatic blood pressure machine (available at most pharmacies). Use this to monitor your blood pressure at home and report the results to your doctor.  Follow Up:  Regular visits to your own physician for blood pressure checks and medicine adjustment is an important part of your care. Make a follow-up appointment as directed by our staff.  Get Prompt Medical Attention  if any of the following occur:   Chest pain or shortness of breath   Severe headache   Throbbing or rushing sound in the ears   Nosebleed   Sudden severe abdominal pain   Extreme drowsiness, confusion or fainting   Dizziness or vertigo (dizziness with spinning sensation)   Weakness of an arm or leg or one side of the face   Difficulty with speech or vision   2000-2014 [employer], [address], [place] [address]. All rights reserved. This information is not intended as a substitute for professional medical care. Always follow your healthcare professional's instructions.

## 2014-04-15 ENCOUNTER — Emergency Department
Admission: EM | Admit: 2014-04-15 | Discharge: 2014-04-15 | Disposition: A | Discharge status: Home / Self Care | Payer: Medicare (Managed Care) | Attending: Emergency Medical Services | Admitting: Emergency Medical Services

## 2014-04-15 ENCOUNTER — Emergency Department: Payer: Medicare (Managed Care)

## 2014-04-15 DIAGNOSIS — Z7982 Long term (current) use of aspirin: Secondary | ICD-10-CM | POA: Insufficient documentation

## 2014-04-15 DIAGNOSIS — R609 Edema, unspecified: Secondary | ICD-10-CM | POA: Insufficient documentation

## 2014-04-15 DIAGNOSIS — N289 Disorder of kidney and ureter, unspecified: Secondary | ICD-10-CM | POA: Insufficient documentation

## 2014-04-15 DIAGNOSIS — J45909 Unspecified asthma, uncomplicated: Secondary | ICD-10-CM | POA: Insufficient documentation

## 2014-04-15 DIAGNOSIS — E785 Hyperlipidemia, unspecified: Secondary | ICD-10-CM | POA: Insufficient documentation

## 2014-04-15 DIAGNOSIS — R600 Localized edema: Secondary | ICD-10-CM

## 2014-04-15 DIAGNOSIS — I10 Essential (primary) hypertension: Secondary | ICD-10-CM | POA: Insufficient documentation

## 2014-04-15 DIAGNOSIS — M129 Arthropathy, unspecified: Secondary | ICD-10-CM | POA: Insufficient documentation

## 2014-04-15 DIAGNOSIS — M818 Other osteoporosis without current pathological fracture: Secondary | ICD-10-CM | POA: Insufficient documentation

## 2014-04-15 HISTORY — DX: Celiac disease: K90.0

## 2014-04-15 HISTORY — DX: Nontraumatic subdural hemorrhage, unspecified: I62.00

## 2014-04-15 HISTORY — DX: Other specified bacterial intestinal infections: A04.8

## 2014-04-15 LAB — CBC AND DIFFERENTIAL
Basophils Absolute Automated: 0.03 10*3/uL (ref 0.00–0.20)
Basophils Automated: 0 %
Eosinophils Absolute Automated: 0.22 10*3/uL (ref 0.00–0.70)
Eosinophils Automated: 3 %
Hematocrit: 39.7 % (ref 37.0–47.0)
Hgb: 13.2 g/dL (ref 12.0–16.0)
Immature Granulocytes Absolute: 0.02 10*3/uL
Immature Granulocytes: 0 %
Lymphocytes Absolute Automated: 2.63 10*3/uL (ref 0.50–4.40)
Lymphocytes Automated: 33 %
MCH: 28.4 pg (ref 28.0–32.0)
MCHC: 33.2 g/dL (ref 32.0–36.0)
MCV: 85.4 fL (ref 80.0–100.0)
MPV: 10.1 fL (ref 9.4–12.3)
Monocytes Absolute Automated: 0.74 10*3/uL (ref 0.00–1.20)
Monocytes: 9 %
Neutrophils Absolute: 4.33 10*3/uL (ref 1.80–8.10)
Neutrophils: 54 %
Nucleated RBC: 0 /100 WBC (ref 0–1)
Platelets: 281 10*3/uL (ref 140–400)
RBC: 4.65 10*6/uL (ref 4.20–5.40)
RDW: 14 % (ref 12–15)
WBC: 7.95 10*3/uL (ref 3.50–10.80)

## 2014-04-15 LAB — URINALYSIS, REFLEX TO MICROSCOPIC EXAM IF INDICATED
Bilirubin, UA: NEGATIVE
Blood, UA: NEGATIVE
Glucose, UA: NEGATIVE
Ketones UA: NEGATIVE
Nitrite, UA: NEGATIVE
Protein, UR: NEGATIVE
Specific Gravity UA: 1.024 (ref 1.001–1.035)
Urine pH: 5 (ref 5.0–8.0)
Urobilinogen, UA: NORMAL mg/dL

## 2014-04-15 LAB — COMPREHENSIVE METABOLIC PANEL
ALT: 34 U/L (ref 0–55)
AST (SGOT): 21 U/L (ref 5–34)
Albumin/Globulin Ratio: 1.6 (ref 0.9–2.2)
Albumin: 4.1 g/dL (ref 3.5–5.0)
Alkaline Phosphatase: 42 U/L (ref 37–106)
Anion Gap: 10 (ref 5.0–15.0)
BUN: 29 mg/dL — ABNORMAL HIGH (ref 7–19)
Bilirubin, Total: 0.2 mg/dL (ref 0.2–1.2)
CO2: 22 mEq/L (ref 22–29)
Calcium: 9.5 mg/dL (ref 8.5–10.5)
Chloride: 107 mEq/L (ref 100–111)
Creatinine: 0.9 mg/dL (ref 0.6–1.0)
Globulin: 2.6 g/dL (ref 2.0–3.6)
Glucose: 101 mg/dL — ABNORMAL HIGH (ref 70–100)
Potassium: 3.8 mEq/L (ref 3.5–5.1)
Protein, Total: 6.7 g/dL (ref 6.0–8.3)
Sodium: 139 mEq/L (ref 136–145)

## 2014-04-15 LAB — IHS D-DIMER: D-Dimer: 0.36 ug/mL FEU (ref 0.00–0.51)

## 2014-04-15 LAB — GFR: EGFR: >60

## 2014-04-15 NOTE — ED Notes (Addendum)
Pt is a 68 yo female, alert and oriented, who is presenting to ED with complaint of bilateral foot swelling and discoloration with associated intermittent tingling x 1 week. Pt called PCP and requested referral to cardiology Brent Bulla). Pt reports dizziness in supine position. Pt able to ambulate independently. Pt daughter reports that pt has decreased pain sensation and cannot always report accurately. Pt denies SOB and CP. Pt also reports weight gain of 36-40 lbs over last 4-6 months, possibly r/t celiac (per endocrinologist). Pt in NAD.

## 2014-04-15 NOTE — ED Provider Notes (Addendum)
Physician/Midlevel provider first contact with patient: 04/15/14 1144         History     Chief Complaint   Patient presents with   . Foot Swelling       Patient notes intermittent swelling of the feet that improved with elevation going on for approximately the last week.  The patient is on a calcium channel blocker  She feels like the veins are more prominent in her feet with blue discoloration not of the entire foot but focally more so on the lateral aspect of the foot and distally.  There is no chest pain or shortness of breath there is no calf swelling    She notes weight gain of 30 pounds over 6 months.  She relates this secondary to chronic arthritis decreased activity and hormonal reasons    There is no fevers or chills is no specific joint swelling or pain  Room in signs and symptoms present for one week gradual onset  No other modifiers    Past Medical History   Diagnosis Date   . Gout, chronic    . Osteoporosis, idiopathic    . PONV (postoperative nausea and vomiting)    . Hypertensive disorder      controlled w meds   . Hyperlipidemia    . Asthma without status asthmaticus      well controlled   . Arthritis    . Celiac disease    . H. pylori infection    . Subdural bleeding        Past Surgical History   Procedure Laterality Date   . Appendectomy     . Hysterectomy     . Foot surgery     . Knee arthroscopy     . Cholecystectomy     . Tonsillectomy     . Arm surgery     . Colonoscopy  12/18/2012     Procedure: COLONOSCOPY;  Surgeon: Lilia Pro, MD;  Location: [facility];  Service: Gastroenterology;  Laterality: N/A;  COLONOSCOPY  ANES=MAC; Q1=N; BB=N,172   . Egd  01/09/2013     Procedure: EGD;  Surgeon: Lilia Pro, MD;  Location: [facility];  Service: Gastroenterology;  Laterality: N/A;  EGD  ANES=MAC; Q1=N; BB=N,172       Family History   Problem Relation Age of Onset   . Heart disease Mother    . Diabetes Mother    . Early death Mother 90     heart disease   . Heart disease Father    .  Diabetes Father    . Stroke Father    . Multiple sclerosis Sister    . Heart disease Brother    . Early death Brother 41     heart disease   . Celiac disease Daughter    . Heart disease Sister    . Multiple sclerosis Sister        Social  History   Substance Use Topics   . Smoking status: Never Smoker    . Smokeless tobacco: Never Used   . Alcohol Use: Yes      Comment: rarely       .     Allergies   Allergen Reactions   . Iodine Anaphylaxis   . Keflex [Cephalexin] Anaphylaxis   . Penicillins Anaphylaxis       Current/Home Medications    ALBUTEROL (PROVENTIL) (2.5 MG/3ML) 0.083% NEBULIZER SOLUTION    Take 3 mLs (2.5 mg total) by nebulization every 4 (  four) hours as needed for Wheezing or Shortness of Breath.    ALBUTEROL IN    Inhale into the lungs as needed.    ALLOPURINOL (ZYLOPRIM) 300 MG TABLET    Take 300 mg by mouth daily.    AMLODIPINE (NORVASC) 5 MG TABLET    Take 1 tablet (5 mg total) by mouth daily.    ASPIRIN 81 MG TABLET    Take 81 mg by mouth daily.    CALCIUM CARBONATE-VITAMIN D (CALCIUM + D PO)    Take 600 mg by mouth 2 (two) times daily before meals.    CHOLECALCIFEROL (VITAMIN D3) 50000 UNITS CAPS    Take 50,000 Units by mouth every mon, wed and fri.       CITALOPRAM (CELEXA) 20 MG TABLET    Take 20 mg by mouth daily.    FEXOFENADINE (ALLEGRA) 60 MG TABLET    Take 60 mg by mouth 2 (two) times daily.    GUAIFENESIN-CODEINE (ROBITUSSIN AC) 100-10 MG/5ML SYRUP    Take 5 mLs by mouth 3 (three) times daily as needed for Cough.    LACTOBACILLUS (ACIDOPHILUS PO)    Take by mouth.    LISINOPRIL (PRINIVIL,ZESTRIL) 20 MG TABLET    Take 40 mg by mouth daily.     MAGNESIUM 200 MG TABS    Take 1,500 mg by mouth daily.       PRAVASTATIN (PRAVACHOL) 40 MG TABLET    Take 40 mg by mouth daily.    PROLIA 60 MG/ML SOLUTION SUBCUTANEOUS INJECTION    January and June          Review of Systems   Constitutional: Negative for fever.   HENT: Negative for congestion.    Respiratory: Negative for shortness of breath.     Cardiovascular: Positive for leg swelling (feet). Negative for chest pain.   Gastrointestinal: Positive for diarrhea. Negative for abdominal pain.   Genitourinary: Negative for dysuria.   Musculoskeletal: Negative.    Skin: Negative for rash.   Neurological: Negative for headaches.   Psychiatric/Behavioral: Negative for dysphoric mood.   All other systems reviewed and are negative.      Physical Exam    BP: 143/74 mmHg, Heart Rate: 83, Temp: 97.5 F (36.4 C), Resp Rate: 20, SpO2: 97 %, Weight: 87.091 kg     Physical Exam   Constitutional:   Well Appearing   HENT:   Head: Normocephalic.   Eyes: Conjunctivae are normal.   Neck:   Normal Inspection   Cardiovascular: Normal rate, regular rhythm and normal heart sounds.    Strong DP and PT pulses bilaterally normal cap refill   Pulmonary/Chest: Breath sounds normal.   Abdominal: Soft. Bowel sounds are normal. There is no tenderness.   Musculoskeletal: She exhibits no edema (Edema minimal edema bilateral feet) or tenderness.   Normal Upper Extremity Inspection   Neurological: She is alert. She has normal strength.   Skin: Skin is warm and dry.   Psychiatric: She has a normal mood and affect.       MDM and ED Course     ED Medication Orders     None              MDM    advise the patient on mild renal insufficiency recommend stockings elevation.  Consider referral to nephrologist if labs are different than baseline.  The patient has no signs and symptoms consistent with congestive heart failure has a clear lung exam clear x-ray edema is located  only in the feet    EKG is normal sinus rhythm without acute changes    Procedures    Clinical Impression & Disposition     Clinical Impression  Final diagnoses:   Edema of foot   Renal insufficiency        ED Disposition     Discharge Izetta Dakin discharge to home/self care.    Condition at disposition: Stable             New Prescriptions    No medications on file               Chase Picket, MD  04/15/14  1230    Chase Picket, MD  04/15/14 [phone]

## 2014-04-15 NOTE — Discharge Instructions (Signed)
Peripheral Edema NOS     You were diagnosed with peripheral edema.    Peripheral edema happens when a part of your body, most often your legs, starts to get swollen. The liquid that is supposed to be in your veins and arteries leaks out of them. It then enters the space between your muscles and fat cells. Peripheral edema is often painless, but it can be uncomfortable if the skin gets too tightly stretched.     A diagnosis of peripheral edema comes from a physical exam made by a doctor. Many things can cause peripheral edema. The most common are:     Congestive heart failure (inability of the heart to pump enough blood to your body).   Damage to your kidneys.   Injuries to a body part.   Liver failure.   Certain medicines.   Malnutrition (not eating enough nutritious food).    Pregnancy.     You can also get peripheral edema when you have been sitting or standing still for a long time. This can happen when you are in the hospital, during surgery, or during long car and plane trips. Your doctor might have done some blood work to see how your heart, liver, and kidneys are working. The doctor might have also done a chest x-ray or checked the pumping of your heart (EKG).     Swelling in the arms and hands is rare. This is often a sign of a more serious problem. Some examples are:      A squeezing of the superior vena cava. This is the big vein in your chest that gets blood from your head and arms.   A liver problem that lowers the amount of protein in your blood.    The tests done today did not find a clear cause for your problem. You will need more tests to figure out what the cause is.    You are safe to go home. It is important to take medicines exactly as you are instructed.     We don't believe your condition is serious right now. But, it is important to be careful. Sometimes a problem that seems small can get serious later. This is why it is very important to come back here or go to the nearest  Emergency Department if you don't get better or if your symptoms get worse.    When you get home, take all medicines as you were instructed. Avoid salt and extra fluids if your peripheral edema is caused by your heart. If your peripheral edema is serious, you might have been started on a diuretic. A diuretic, also called a "water pill," is a medicine that helps the kidneys remove extra water from your body.     Keep all appointments with your doctor. Following up with your doctor or the referral doctor is very important.    YOU SHOULD SEEK MEDICAL ATTENTION IMMEDIATELY, EITHER HERE OR AT THE NEAREST EMERGENCY DEPARTMENT, IF ANY OF THE FOLLOWING OCCUR:     You have vomiting (throwing up) that stops you from keeping any fluids in your stomach.   You have a faster heart rate. You feel lightheaded, or you pass out (lose consciousness).   You are vomiting blood or you have serious chest pain after vomiting.   You have a fever (temperature higher than 100.4F or 38C), chills, or abdominal (belly) pain.    If you can't talk with your doctor, or if you feel you need to be rechecked, come   back here or go to the nearest emergency department.

## 2014-04-16 LAB — ECG 12-LEAD
Atrial Rate: 72 {beats}/min
P Axis: 42 degrees
P-R Interval: 162 ms
Q-T Interval: 424 ms
QRS Duration: 90 ms
QTC Calculation (Bezet): 464 ms
R Axis: 19 degrees
T Axis: 56 degrees
Ventricular Rate: 72 {beats}/min

## 2015-09-03 ENCOUNTER — Other Ambulatory Visit: Payer: Self-pay | Admitting: Orthopaedic Surgery

## 2015-09-23 ENCOUNTER — Emergency Department (HOSPITAL_BASED_OUTPATIENT_CLINIC_OR_DEPARTMENT_OTHER): Payer: Medicare HMO

## 2015-09-23 ENCOUNTER — Emergency Department (HOSPITAL_BASED_OUTPATIENT_CLINIC_OR_DEPARTMENT_OTHER)
Admission: EM | Admit: 2015-09-23 | Discharge: 2015-09-23 | Disposition: A | Discharge status: Home / Self Care | Payer: Medicare HMO | Attending: Emergency Medicine | Admitting: Emergency Medicine

## 2015-09-23 ENCOUNTER — Encounter (HOSPITAL_BASED_OUTPATIENT_CLINIC_OR_DEPARTMENT_OTHER): Payer: Self-pay | Admitting: Emergency Medicine

## 2015-09-23 DIAGNOSIS — E039 Hypothyroidism, unspecified: Secondary | ICD-10-CM | POA: Diagnosis not present

## 2015-09-23 DIAGNOSIS — I10 Essential (primary) hypertension: Secondary | ICD-10-CM | POA: Diagnosis not present

## 2015-09-23 DIAGNOSIS — Y998 Other external cause status: Secondary | ICD-10-CM | POA: Diagnosis not present

## 2015-09-23 DIAGNOSIS — W540XXA Bitten by dog, initial encounter: Secondary | ICD-10-CM | POA: Insufficient documentation

## 2015-09-23 DIAGNOSIS — Y9389 Activity, other specified: Secondary | ICD-10-CM | POA: Diagnosis not present

## 2015-09-23 DIAGNOSIS — Z880 Allergy status to penicillin: Secondary | ICD-10-CM | POA: Insufficient documentation

## 2015-09-23 DIAGNOSIS — Z7984 Long term (current) use of oral hypoglycemic drugs: Secondary | ICD-10-CM | POA: Diagnosis not present

## 2015-09-23 DIAGNOSIS — Z79899 Other long term (current) drug therapy: Secondary | ICD-10-CM | POA: Insufficient documentation

## 2015-09-23 DIAGNOSIS — M1990 Unspecified osteoarthritis, unspecified site: Secondary | ICD-10-CM | POA: Insufficient documentation

## 2015-09-23 DIAGNOSIS — S61012A Laceration without foreign body of left thumb without damage to nail, initial encounter: Secondary | ICD-10-CM

## 2015-09-23 DIAGNOSIS — S6992XA Unspecified injury of left wrist, hand and finger(s), initial encounter: Secondary | ICD-10-CM | POA: Diagnosis present

## 2015-09-23 DIAGNOSIS — Y9289 Other specified places as the place of occurrence of the external cause: Secondary | ICD-10-CM | POA: Diagnosis not present

## 2015-09-23 DIAGNOSIS — Z794 Long term (current) use of insulin: Secondary | ICD-10-CM | POA: Diagnosis not present

## 2015-09-23 DIAGNOSIS — E119 Type 2 diabetes mellitus without complications: Secondary | ICD-10-CM | POA: Diagnosis not present

## 2015-09-23 DIAGNOSIS — Z7982 Long term (current) use of aspirin: Secondary | ICD-10-CM | POA: Diagnosis not present

## 2015-09-23 DIAGNOSIS — E7800 Pure hypercholesterolemia, unspecified: Secondary | ICD-10-CM | POA: Insufficient documentation

## 2015-09-23 DIAGNOSIS — Z23 Encounter for immunization: Secondary | ICD-10-CM | POA: Insufficient documentation

## 2015-09-23 HISTORY — DX: Essential (primary) hypertension: I10

## 2015-09-23 HISTORY — DX: Hypothyroidism, unspecified: E03.9

## 2015-09-23 HISTORY — DX: Pure hypercholesterolemia, unspecified: E78.00

## 2015-09-23 HISTORY — DX: Type 2 diabetes mellitus without complications: E11.9

## 2015-09-23 HISTORY — DX: Unspecified osteoarthritis, unspecified site: M19.90

## 2015-09-23 MED ORDER — LEVOFLOXACIN 500 MG PO TABS: 500.0000 mg | ORAL_TABLET | Freq: Every day | ORAL | Status: AC

## 2015-09-23 MED ORDER — LIDOCAINE HCL (PF) 1 % IJ SOLN
30.0000 mL | Freq: Once | INTRAMUSCULAR | Status: AC
  Administered 2015-09-23: 5 mL
  Filled 2015-09-23: qty 30

## 2015-09-23 MED ORDER — CLINDAMYCIN HCL 150 MG PO CAPS
450.0000 mg | ORAL_CAPSULE | Freq: Three times a day (TID) | ORAL | Status: AC
Start: 2015-09-23 — End: 2015-09-29

## 2015-09-23 MED ORDER — TETANUS-DIPHTH-ACELL PERTUSSIS 5-2.5-18.5 LF-MCG/0.5 IM SUSP
0.5000 mL | Freq: Once | INTRAMUSCULAR | Status: AC
  Administered 2015-09-23: 0.5 mL via INTRAMUSCULAR
  Filled 2015-09-23: qty 0.5

## 2015-09-23 NOTE — ED Notes (Signed)
Pt bit by her own dog.  Pt has bite to left thumb.  Pt states she was reaching to help her dog and she bit around her thumb.  Dressing in place.  Sent by PCP for eval.

## 2015-09-23 NOTE — ED Notes (Signed)
Patient ambulatory and bulky dressing applied to wound prior to discharge.

## 2015-09-23 NOTE — Discharge Instructions (Signed)
Suture removal in 5-7 days by PCP. Watch for signs of infection. Take antibiotics as prescribed  Sutured Wound Care Sutures are stitches that can be used to close wounds. Taking care of your wound properly can help to prevent pain and infection. It can also help your wound to heal more quickly. HOW TO CARE FOR YOUR SUTURED WOUND Wound Care  Keep the wound clean and dry.  If you were given a bandage (dressing), you should change it at least once per day or as directed by your health care provider. You should also change it if it becomes wet or dirty.  Keep the wound completely dry for the first 24 hours or as directed by your health care provider. After that time, you may shower or bathe. However, make sure that the wound is not soaked in water until the sutures have been removed.  Clean the wound one time each day or as directed by your health care provider.  Wash the wound with soap and water.  Rinse the wound with water to remove all soap.  Pat the wound dry with a clean towel. Do not rub the wound.  Aftercleaning the wound, apply a thin layer of antibioticointment as directed by your health care provider. This will help to prevent infection and keep the dressing from sticking to the wound.  Have the sutures removed as directed by your health care provider. General Instructions  Take or apply medicines only as directed by your health care provider.  To help prevent scarring, make sure to cover your wound with sunscreen whenever you are outside after the sutures are removed and the wound is healed. Make sure to wear a sunscreen of at least 30 SPF.  If you were prescribed an antibiotic medicine or ointment, finish all of it even if you start to feel better.  Do not scratch or pick at the wound.  Keep all follow-up visits as directed by your health care provider. This is important.  Check your wound every day for signs of infection. Watch for:   Redness, swelling, or  pain.  Fluid, blood, or pus.  Raise (elevate) the injured area above the level of your heart while you are sitting or lying down, if possible.  Avoid stretching your wound.  Drink enough fluids to keep your urine clear or pale yellow. SEEK MEDICAL CARE IF:  You received a tetanus shot and you have swelling, severe pain, redness, or bleeding at the injection site.  You have a fever.  A wound that was closed breaks open.  You notice a bad smell coming from the wound.  You notice something coming out of the wound, such as wood or glass.  Your pain is not controlled with medicine.  You have increased redness, swelling, or pain at the site of your wound.  You have fluid, blood, or pus coming from your wound.  You notice a change in the color of your skin near your wound.  You need to change the dressing frequently due to fluid, blood, or pus draining from the wound.  You develop a new rash.  You develop numbness around the wound. SEEK IMMEDIATE MEDICAL CARE IF:  You develop severe swelling around the injury site.  Your pain suddenly increases and is severe.  You develop painful lumps near the wound or on skin that is anywhere on your body.  You have a red streak going away from your wound.  The wound is on your hand or foot and you  cannot properly move a finger or toe.  The wound is on your hand or foot and you notice that your fingers or toes look pale or bluish.   This information is not intended to replace advice given to you by your health care provider. Make sure you discuss any questions you have with your health care provider.   Document Released: 08/17/2004 Document Revised: 11/24/2014 Document Reviewed: 02/19/2013 Elsevier Interactive Patient Education 2016 [employer]  Laceration Care, Adult A laceration is a cut that goes through all layers of the skin. The cut also goes into the tissue that is right under the skin. Some cuts heal on their own. Others  need to be closed with stitches (sutures), staples, skin adhesive strips, or wound glue. Taking care of your cut lowers your risk of infection and helps your cut to heal better. HOW TO TAKE CARE OF YOUR CUT For stitches or staples:  Keep the wound clean and dry.  If you were given a bandage (dressing), you should change it at least one time per day or as told by your doctor. You should also change it if it gets wet or dirty.  Keep the wound completely dry for the first 24 hours or as told by your doctor. After that time, you may take a shower or a bath. However, make sure that the wound is not soaked in water until after the stitches or staples have been removed.  Clean the wound one time each day or as told by your doctor:  Wash the wound with soap and water.  Rinse the wound with water until all of the soap comes off.  Pat the wound dry with a clean towel. Do not rub the wound.  After you clean the wound, put a thin layer of antibiotic ointment on it as told by your doctor. This ointment:  Helps to prevent infection.  Keeps the bandage from sticking to the wound.  Have your stitches or staples removed as told by your doctor. If your doctor used skin adhesive strips:   Keep the wound clean and dry.  If you were given a bandage, you should change it at least one time per day or as told by your doctor. You should also change it if it gets dirty or wet.  Do not get the skin adhesive strips wet. You can take a shower or a bath, but be careful to keep the wound dry.  If the wound gets wet, pat it dry with a clean towel. Do not rub the wound.  Skin adhesive strips fall off on their own. You can trim the strips as the wound heals. Do not remove any strips that are still stuck to the wound. They will fall off after a while. If your doctor used wound glue:  Try to keep your wound dry, but you may briefly wet it in the shower or bath. Do not soak the wound in water, such as by  swimming.  After you take a shower or a bath, gently pat the wound dry with a clean towel. Do not rub the wound.  Do not do any activities that will make you really sweaty until the skin glue has fallen off on its own.  Do not apply liquid, cream, or ointment medicine to your wound while the skin glue is still on.  If you were given a bandage, you should change it at least one time per day or as told by your doctor. You should also change  it if it gets dirty or wet.  If a bandage is placed over the wound, do not let the tape for the bandage touch the skin glue.  Do not pick at the glue. The skin glue usually stays on for 5-10 days. Then, it falls off of the skin. General Instructions  To help prevent scarring, make sure to cover your wound with sunscreen whenever you are outside after stitches are removed, after adhesive strips are removed, or when wound glue stays in place and the wound is healed. Make sure to wear a sunscreen of at least 30 SPF.  Take over-the-counter and prescription medicines only as told by your doctor.  If you were given antibiotic medicine or ointment, take or apply it as told by your doctor. Do not stop using the antibiotic even if your wound is getting better.  Do not scratch or pick at the wound.  Keep all follow-up visits as told by your doctor. This is important.  Check your wound every day for signs of infection. Watch for:  Redness, swelling, or pain.  Fluid, blood, or pus.  Raise (elevate) the injured area above the level of your heart while you are sitting or lying down, if possible. GET HELP IF:  You got a tetanus shot and you have any of these problems at the injection site:  Swelling.  Very bad pain.  Redness.  Bleeding.  You have a fever.  A wound that was closed breaks open.  You notice a bad smell coming from your wound or your bandage.  You notice something coming out of the wound, such as wood or glass.  Medicine does not  help your pain.  You have more redness, swelling, or pain at the site of your wound.  You have fluid, blood, or pus coming from your wound.  You notice a change in the color of your skin near your wound.  You need to change the bandage often because fluid, blood, or pus is coming from the wound.  You start to have a new rash.  You start to have numbness around the wound. GET HELP RIGHT AWAY IF:  You have very bad swelling around the wound.  Your pain suddenly gets worse and is very bad.  You notice painful lumps near the wound or on skin that is anywhere on your body.  You have a red streak going away from your wound.  The wound is on your hand or foot and you cannot move a finger or toe like you usually can.  The wound is on your hand or foot and you notice that your fingers or toes look pale or bluish.   This information is not intended to replace advice given to you by your health care provider. Make sure you discuss any questions you have with your health care provider.   Document Released: 12/27/2007 Document Revised: 11/24/2014 Document Reviewed: 07/06/2014 Elsevier Interactive Patient Education [employer]

## 2015-09-23 NOTE — ED Notes (Signed)
Neuro intact on affected finger.

## 2015-09-23 NOTE — ED Provider Notes (Signed)
CSN: [id number]     Arrival date & time 09/23/15  1138 History   First MD Initiated Contact with Patient 09/23/15 1203     Chief Complaint  Patient presents with  . Animal Bite     (Consider location/radiation/quality/duration/timing/severity/associated sxs/prior Treatment) HPI 70 year old female who presents with dog bite. Right hand dominant. Pet dog bit her over left hand today, involving left thumb. Called PCP and sent to ED for evaluation. No redness, pus, significant bleeding, or fever. Dog's shots up to date. Unknown last tetanus.  Past Medical History  Diagnosis Date  . Hypertension   . Diabetes mellitus without complication (HCC)   . Arthritis   . Hypothyroidism   . Hypercholesteremia    Past Surgical History  Procedure Laterality Date  . Thyroid surgery    . Tubal ligation     No family history on file. Social History  Substance Use Topics  . Smoking status: Never Smoker   . Smokeless tobacco: None  . Alcohol Use: No   OB History    No data available     Review of Systems 10/14 systems reviewed and are negative other than those stated in the HPI    Allergies  Cefaclor; Codeine; Penicillins; Sulfa antibiotics; and Vibramycin  Home Medications   Prior to Admission medications   Medication Sig Start Date End Date Taking? Authorizing Provider  albuterol (PROVENTIL HFA;VENTOLIN HFA) 108 (90 Base) MCG/ACT inhaler Inhale 2 puffs into the lungs every 6 (six) hours as needed for wheezing or shortness of breath.   Yes Historical Provider, MD  aspirin 81 MG tablet Take 81 mg by mouth daily.   Yes Historical Provider, MD  carvedilol (COREG) 12.5 MG tablet Take 6.25 mg by mouth 2 (two) times daily with a meal.   Yes Historical Provider, MD  escitalopram (LEXAPRO) 10 MG tablet Take 10 mg by mouth daily.   Yes Historical Provider, MD  fluticasone (FLONASE) 50 MCG/ACT nasal spray Place 1 spray into both nostrils daily.   Yes Historical Provider, MD  gabapentin  (NEURONTIN) 100 MG capsule Take 300 mg by mouth at bedtime.   Yes Historical Provider, MD  insulin NPH-regular Human (NOVOLIN 70/30) (70-30) 100 UNIT/ML injection Inject 30 Units into the skin daily with breakfast.   Yes Historical Provider, MD  levothyroxine (SYNTHROID, LEVOTHROID) 150 MCG tablet Take 150 mcg by mouth daily before breakfast.   Yes Historical Provider, MD  LORazepam (ATIVAN) 1 MG tablet Take 1 mg by mouth 3 times/day as needed-between meals & bedtime for anxiety. 09/23/15 10/24/15 Yes Historical Provider, MD  losartan (COZAAR) 100 MG tablet Take 100 mg by mouth daily.   Yes Historical Provider, MD  magnesium oxide (MAG-OX) 400 MG tablet Take 400 mg by mouth daily.   Yes Historical Provider, MD  meloxicam (MOBIC) 7.5 MG tablet Take 7.5 mg by mouth daily.   Yes Historical Provider, MD  metFORMIN (GLUCOPHAGE) 500 MG tablet Take 1,000 mg by mouth 2 (two) times daily with a meal.   Yes Historical Provider, MD  simvastatin (ZOCOR) 20 MG tablet Take 20 mg by mouth daily.   Yes Historical Provider, MD  spironolactone (ALDACTONE) 25 MG tablet Take 25 mg by mouth daily.   Yes Historical Provider, MD  Vitamin D, Ergocalciferol, (DRISDOL) 50000 units CAPS capsule Take 50,000 Units by mouth every 7 (seven) days.   Yes Historical Provider, MD  clindamycin (CLEOCIN) 150 MG capsule Take 3 capsules (450 mg total) by mouth 3 (three) times daily. 09/23/15  09/29/15  Lavera Guise, MD  levofloxacin (LEVAQUIN) 500 MG tablet Take 1 tablet (500 mg total) by mouth daily. 09/23/15 09/29/15  Lavera Guise, MD   BP 164/62 mmHg  Pulse 60  Temp(Src) 98.7 F (37.1 C) (Oral)  Resp 18  Ht  (1.651 m)  Wt 224 lb (101.606 kg)  BMI 37.28 kg/m2  SpO2 96% Physical Exam Physical Exam  Nursing note and vitals reviewed. Constitutional: Well developed, well nourished, non-toxic, and in no acute distress Head: Normocephalic and atraumatic.  Mouth/Throat: MMM  Neck: Normal range of motion.  Cardiovascular: +2 radial pulses  bilaterally, normal distal capillary refill involving injured hand Pulmonary/Chest: Effort normal  Abdominal: Soft. Non-distended Musculoskeletal: U-shaped 1.5 cm-2 cm laceration involving dorsal surface of the left thumb proximal to DIP and distal to nail. No active bleeding. No exposure of tendons. No overlying erythema induration or drainage. Full rom of left thumb at all joints. Neurological: Alert, no facial droop, fluent speech, moves all extremities symmetrically, sensation in tact in left hand, in tact motor strength of the left hand Skin: Skin is warm and dry.  Psychiatric: Cooperative  ED Course  .Marland KitchenLaceration Repair Date/Time: 09/23/2015 8:15 PM Performed by: Crista Curb DUO Authorized by: Crista Curb DUO Consent: Verbal consent obtained. Risks and benefits: risks, benefits and alternatives were discussed Consent given by: patient Patient identity confirmed: verbally with patient Time out: Immediately prior to procedure a "time out" was called to verify the correct patient, procedure, equipment, support staff and site/side marked as required. Body area: upper extremity Location details: left thumb Laceration length: 1.5 cm Foreign bodies: no foreign bodies Tendon involvement: none Nerve involvement: none Vascular damage: no Anesthesia: digital block Local anesthetic: lidocaine 1% without epinephrine Anesthetic total: 3 ml Patient sedated: no Preparation: Patient was prepped and draped in the usual sterile fashion. Irrigation solution: saline Irrigation method: syringe Amount of cleaning: extensive Debridement: none Degree of undermining: none Skin closure: 5-0 nylon Number of sutures: 4 Approximation: loose Approximation difficulty: simple Dressing: 4x4 sterile gauze   (including critical care time) Labs Review Labs Reviewed - No data to display  Imaging Review Dg Finger Thumb Left  09/23/2015  CLINICAL DATA:  Dog bite to the thumb.  Initial encounter. EXAM: LEFT  THUMB 2+V COMPARISON:  None. FINDINGS: No fracture or dislocation. Mild degenerative change involving the IP joint of the thumb with joint space loss, subchondral sclerosis and osteophytosis. There is a tiny (approximately 1 mm) ossicle about the distal dorsal aspect of the proximal phalanx of the thumb, likely the sequela of prior avulsive injury. Regional soft tissues appear normal. No radiopaque foreign body. IMPRESSION: No fracture, dislocation or radiopaque foreign body. Electronically Signed   By: Simonne Come M.D.   On: 09/23/2015 12:43   I have personally reviewed and evaluated these images and lab results as part of my medical decision-making.   EKG Interpretation None      MDM   Final diagnoses:  Dog bite  Thumb laceration, left, initial encounter    70 year old female with history of HTN and DM who presents with dog bite to the left hand. Well appearing on arrival. With U-shaped laceration involving left thumb over dorsal surface just distal to DIP and proximal to nail. XR without fracture. No exposure of tendon. Normal ROM and neurovascularly in tact. XR negative for fracture or foreign body.  Irrigated heavily and sutured laceration. Allergy to PCN, sulfa, and doxy. Treated with levofloxacin and clindamycin per up to  date recommendations for dog bite. Tetanus updated. Discussed wound care instructions and warned regarding signs of infection.  Strict return and follow-up instructions reviewed. She expressed understanding of all discharge instructions and felt comfortable with the plan of care.     Lavera Guise, MD 09/23/15 2018

## 2016-06-29 ENCOUNTER — Other Ambulatory Visit: Payer: Self-pay | Admitting: Orthopaedic Surgery

## 2017-09-05 ENCOUNTER — Other Ambulatory Visit: Payer: Self-pay | Admitting: Radiology

## 2019-11-12 ENCOUNTER — Other Ambulatory Visit: Payer: Self-pay | Admitting: Family Medicine

## 2019-11-17 ENCOUNTER — Other Ambulatory Visit: Payer: Self-pay | Admitting: Family Medicine

## 2020-06-15 ENCOUNTER — Emergency Department (HOSPITAL_BASED_OUTPATIENT_CLINIC_OR_DEPARTMENT_OTHER): Payer: Medicare HMO

## 2020-06-15 ENCOUNTER — Inpatient Hospital Stay (HOSPITAL_BASED_OUTPATIENT_CLINIC_OR_DEPARTMENT_OTHER)
Admission: EM | Admit: 2020-06-15 | Discharge: 2020-06-25 | DRG: 233 | Disposition: A | Discharge status: Home Health | Payer: Medicare HMO | Attending: Cardiothoracic Surgery | Admitting: Cardiothoracic Surgery

## 2020-06-15 ENCOUNTER — Encounter (HOSPITAL_BASED_OUTPATIENT_CLINIC_OR_DEPARTMENT_OTHER): Payer: Self-pay

## 2020-06-15 ENCOUNTER — Other Ambulatory Visit: Payer: Self-pay

## 2020-06-15 DIAGNOSIS — I2510 Atherosclerotic heart disease of native coronary artery without angina pectoris: Secondary | ICD-10-CM | POA: Diagnosis present

## 2020-06-15 DIAGNOSIS — G4733 Obstructive sleep apnea (adult) (pediatric): Secondary | ICD-10-CM | POA: Diagnosis present

## 2020-06-15 DIAGNOSIS — E1140 Type 2 diabetes mellitus with diabetic neuropathy, unspecified: Secondary | ICD-10-CM | POA: Diagnosis present

## 2020-06-15 DIAGNOSIS — Z79890 Hormone replacement therapy (postmenopausal): Secondary | ICD-10-CM

## 2020-06-15 DIAGNOSIS — E1165 Type 2 diabetes mellitus with hyperglycemia: Secondary | ICD-10-CM | POA: Diagnosis present

## 2020-06-15 DIAGNOSIS — F419 Anxiety disorder, unspecified: Secondary | ICD-10-CM | POA: Diagnosis present

## 2020-06-15 DIAGNOSIS — Z79899 Other long term (current) drug therapy: Secondary | ICD-10-CM

## 2020-06-15 DIAGNOSIS — Z794 Long term (current) use of insulin: Secondary | ICD-10-CM

## 2020-06-15 DIAGNOSIS — I10 Essential (primary) hypertension: Secondary | ICD-10-CM

## 2020-06-15 DIAGNOSIS — D72829 Elevated white blood cell count, unspecified: Secondary | ICD-10-CM

## 2020-06-15 DIAGNOSIS — R079 Chest pain, unspecified: Secondary | ICD-10-CM

## 2020-06-15 DIAGNOSIS — R110 Nausea: Secondary | ICD-10-CM

## 2020-06-15 DIAGNOSIS — Z6839 Body mass index (BMI) 39.0-39.9, adult: Secondary | ICD-10-CM

## 2020-06-15 DIAGNOSIS — Z951 Presence of aortocoronary bypass graft: Secondary | ICD-10-CM

## 2020-06-15 DIAGNOSIS — Z7982 Long term (current) use of aspirin: Secondary | ICD-10-CM

## 2020-06-15 DIAGNOSIS — I214 Non-ST elevation (NSTEMI) myocardial infarction: Secondary | ICD-10-CM

## 2020-06-15 DIAGNOSIS — I110 Hypertensive heart disease with heart failure: Secondary | ICD-10-CM | POA: Diagnosis present

## 2020-06-15 DIAGNOSIS — E1121 Type 2 diabetes mellitus with diabetic nephropathy: Secondary | ICD-10-CM

## 2020-06-15 DIAGNOSIS — R001 Bradycardia, unspecified: Secondary | ICD-10-CM | POA: Diagnosis not present

## 2020-06-15 DIAGNOSIS — Z20822 Contact with and (suspected) exposure to covid-19: Secondary | ICD-10-CM | POA: Diagnosis present

## 2020-06-15 DIAGNOSIS — Z791 Long term (current) use of non-steroidal anti-inflammatories (NSAID): Secondary | ICD-10-CM

## 2020-06-15 DIAGNOSIS — E785 Hyperlipidemia, unspecified: Secondary | ICD-10-CM | POA: Diagnosis present

## 2020-06-15 DIAGNOSIS — Z9889 Other specified postprocedural states: Secondary | ICD-10-CM

## 2020-06-15 DIAGNOSIS — I480 Paroxysmal atrial fibrillation: Secondary | ICD-10-CM | POA: Diagnosis not present

## 2020-06-15 DIAGNOSIS — J45901 Unspecified asthma with (acute) exacerbation: Secondary | ICD-10-CM

## 2020-06-15 DIAGNOSIS — E1169 Type 2 diabetes mellitus with other specified complication: Secondary | ICD-10-CM

## 2020-06-15 DIAGNOSIS — D72828 Other elevated white blood cell count: Secondary | ICD-10-CM | POA: Diagnosis present

## 2020-06-15 DIAGNOSIS — R9431 Abnormal electrocardiogram [ECG] [EKG]: Secondary | ICD-10-CM

## 2020-06-15 DIAGNOSIS — F32A Depression, unspecified: Secondary | ICD-10-CM | POA: Diagnosis present

## 2020-06-15 DIAGNOSIS — E039 Hypothyroidism, unspecified: Secondary | ICD-10-CM

## 2020-06-15 DIAGNOSIS — I5041 Acute combined systolic (congestive) and diastolic (congestive) heart failure: Secondary | ICD-10-CM | POA: Diagnosis present

## 2020-06-15 DIAGNOSIS — E669 Obesity, unspecified: Secondary | ICD-10-CM

## 2020-06-15 DIAGNOSIS — E7800 Pure hypercholesterolemia, unspecified: Secondary | ICD-10-CM | POA: Diagnosis present

## 2020-06-15 HISTORY — DX: Unspecified asthma, uncomplicated: J45.909

## 2020-06-15 HISTORY — DX: Atherosclerotic heart disease of native coronary artery without angina pectoris: I25.10

## 2020-06-15 HISTORY — DX: Non-ST elevation (NSTEMI) myocardial infarction: I21.4

## 2020-06-15 LAB — CBC
HCT: 40.6 % (ref 36.0–46.0)
Hemoglobin: 13.4 g/dL (ref 12.0–15.0)
MCH: 29.1 pg (ref 26.0–34.0)
MCHC: 33 g/dL (ref 30.0–36.0)
MCV: 88.1 fL (ref 80.0–100.0)
Platelets: 272 10*3/uL (ref 150–400)
RBC: 4.61 MIL/uL (ref 3.87–5.11)
RDW: 14 % (ref 11.5–15.5)
WBC: 13 10*3/uL — ABNORMAL HIGH (ref 4.0–10.5)
nRBC: 0 % (ref 0.0–0.2)

## 2020-06-15 LAB — BASIC METABOLIC PANEL
Anion gap: 12 (ref 5–15)
BUN: 17 mg/dL (ref 8–23)
CO2: 19 mmol/L — ABNORMAL LOW (ref 22–32)
Calcium: 8.6 mg/dL — ABNORMAL LOW (ref 8.9–10.3)
Chloride: 103 mmol/L (ref 98–111)
Creatinine, Ser: 1.08 mg/dL — ABNORMAL HIGH (ref 0.44–1.00)
GFR, Estimated: 54 mL/min — ABNORMAL LOW (ref >60)
Glucose, Bld: 353 mg/dL — ABNORMAL HIGH (ref 70–99)
Potassium: 3.8 mmol/L (ref 3.5–5.1)
Sodium: 134 mmol/L — ABNORMAL LOW (ref 135–145)

## 2020-06-15 LAB — TROPONIN I (HIGH SENSITIVITY)
Troponin I (High Sensitivity): 1069 ng/L (ref <18)
Troponin I (High Sensitivity): 6026 ng/L (ref <18)

## 2020-06-15 LAB — RESP PANEL BY RT-PCR (FLU A&B, COVID) ARPGX2
Influenza A by PCR: NEGATIVE
Influenza B by PCR: NEGATIVE
SARS Coronavirus 2 by RT PCR: NEGATIVE

## 2020-06-15 MED ORDER — HEPARIN BOLUS VIA INFUSION
4000.0000 [IU] | Freq: Once | INTRAVENOUS | Status: AC
  Administered 2020-06-15: 4000 [IU] via INTRAVENOUS

## 2020-06-15 MED ORDER — ASPIRIN 81 MG PO CHEW
324.0000 mg | CHEWABLE_TABLET | Freq: Once | ORAL | Status: DC
  Filled 2020-06-15: qty 4

## 2020-06-15 MED ORDER — HEPARIN (PORCINE) 25000 UT/250ML-% IV SOLN
1150.0000 [IU]/h | INTRAVENOUS | Status: DC
  Administered 2020-06-15: 1000 [IU]/h via INTRAVENOUS
  Filled 2020-06-15: qty 250

## 2020-06-15 NOTE — ED Notes (Signed)
Taken to xray at this time. 

## 2020-06-15 NOTE — ED Notes (Signed)
OK per Dr Madilyn Hook for pt to have something to drink. Pt requested water and pt given the same.

## 2020-06-15 NOTE — ED Notes (Signed)
Date and time results received: 06/15/20 2113  Test: troponin Critical Value: 1069  Name of Provider Notified: Dr. Madilyn Hook  Orders Received? Or Actions Taken?: new orders initiated

## 2020-06-15 NOTE — ED Provider Notes (Signed)
[facility] EMERGENCY DEPARTMENT Provider Note   CSN: [id number] Arrival date & time: 06/15/20  1857     History Chief Complaint  Patient presents with  . Chest Pain    Angela Frazier is a 74 y.o. female.  The history is provided by the patient, a relative and medical records.  Chest Pain  Angela Frazier is a 74 y.o. female who presents to the Emergency Department complaining of chest pain. She presents to the ED accompanied by her son for evaluation of chest pain.  Pain is described as tightness that started around 1600 today.  It is located in the central chest.  She has associated discomfort in the back of her left arm.  Sxs started a little while after she assisted a family member that fell.  She has associated nausea.  Sxs are coming and going.    Denies fever, cough, leg swelling/pain.  Was SOB - now better.  No recent illnesses.  She was feeling well earlier this morning.  She received the pfizer booster on Friday.      Past Medical History:  Diagnosis Date  . Arthritis   . Asthma   . Diabetes mellitus without complication (HCC)   . Hypercholesteremia   . Hypertension   . Hypothyroidism     Patient Active Problem List   Diagnosis Date Noted  . NSTEMI (non-ST elevated myocardial infarction) (HCC) 06/15/2020    Past Surgical History:  Procedure Laterality Date  . THYROID SURGERY    . TUBAL LIGATION       OB History   No obstetric history on file.     No family history on file.  Social History   Tobacco Use  . Smoking status: Never Smoker  . Smokeless tobacco: Never Used  Vaping Use  . Vaping Use: Never used  Substance Use Topics  . Alcohol use: No  . Drug use: Never    Home Medications Prior to Admission medications   Medication Sig Start Date End Date Taking? Authorizing Provider  albuterol (PROVENTIL HFA;VENTOLIN HFA) 108 (90 Base) MCG/ACT inhaler Inhale 2 puffs into the lungs every 6 (six) hours as needed for wheezing or shortness of  breath.    [provider]  aspirin 81 MG tablet Take 81 mg by mouth daily.    [provider]  carvedilol (COREG) 12.5 MG tablet Take 6.25 mg by mouth 2 (two) times daily with a meal.    [provider]  escitalopram (LEXAPRO) 10 MG tablet Take 10 mg by mouth daily.    [provider]  fluticasone (FLONASE) 50 MCG/ACT nasal spray Place 1 spray into both nostrils daily.    [provider]  gabapentin (NEURONTIN) 100 MG capsule Take 300 mg by mouth at bedtime.    [provider]  insulin NPH-regular Human (NOVOLIN 70/30) (70-30) 100 UNIT/ML injection Inject 30 Units into the skin daily with breakfast.    [provider]  levothyroxine (SYNTHROID, LEVOTHROID) 150 MCG tablet Take 150 mcg by mouth daily before breakfast.    [provider]  losartan (COZAAR) 100 MG tablet Take 100 mg by mouth daily.    [provider]  magnesium oxide (MAG-OX) 400 MG tablet Take 400 mg by mouth daily.    [provider]  meloxicam (MOBIC) 7.5 MG tablet Take 7.5 mg by mouth daily.    [provider]  metFORMIN (GLUCOPHAGE) 500 MG tablet Take 1,000 mg by mouth 2 (two) times daily with a meal.  [provider]  simvastatin (ZOCOR) 20 MG tablet Take 20 mg by mouth daily.    [provider]  spironolactone (ALDACTONE) 25 MG tablet Take 25 mg by mouth daily.    [provider]  Vitamin D, Ergocalciferol, (DRISDOL) 50000 units CAPS capsule Take 50,000 Units by mouth every 7 (seven) days.    [provider]    Allergies    Cefaclor, Codeine, Penicillins, Sulfa antibiotics, Trazodone and nefazodone, and Vibramycin [doxycycline calcium]  Review of Systems   Review of Systems  Cardiovascular: Positive for chest pain.  All other systems reviewed and are negative.   Physical Exam Updated Vital Signs BP (!) 150/74   Pulse 90   Temp 98.3 F (36.8 C) (Oral)   Resp 18   Ht 5\' 5"  (1.651  m)   Wt 105.7 kg   SpO2 93%   BMI 38.77 kg/m   Physical Exam Vitals and nursing note reviewed.  Constitutional:      Appearance: She is well-developed.  HENT:     Head: Normocephalic and atraumatic.  Cardiovascular:     Rate and Rhythm: Normal rate and regular rhythm.     Heart sounds: No murmur heard.   Pulmonary:     Effort: Pulmonary effort is normal. No respiratory distress.     Breath sounds: Normal breath sounds.  Abdominal:     Palpations: Abdomen is soft.     Tenderness: There is no abdominal tenderness. There is no guarding or rebound.  Musculoskeletal:        General: No swelling or tenderness.     Comments: 2+ DP and radial pulses bilaterally  Skin:    General: Skin is warm and dry.  Neurological:     Mental Status: She is alert and oriented to person, place, and time.  Psychiatric:        Behavior: Behavior normal.     ED Results / Procedures / Treatments   Labs (all labs ordered are listed, but only abnormal results are displayed) Labs Reviewed  BASIC METABOLIC PANEL - Abnormal; Notable for the following components:      Result Value   Sodium 134 (*)    CO2 19 (*)    Glucose, Bld 353 (*)    Creatinine, Ser 1.08 (*)    Calcium 8.6 (*)    GFR, Estimated 54 (*)    All other components within normal limits  CBC - Abnormal; Notable for the following components:   WBC 13.0 (*)    All other components within normal limits  TROPONIN I (HIGH SENSITIVITY) - Abnormal; Notable for the following components:   Troponin I (High Sensitivity) 1,069 (*)    All other components within normal limits  TROPONIN I (HIGH SENSITIVITY) - Abnormal; Notable for the following components:   Troponin I (High Sensitivity) 6,026 (*)    All other components within normal limits  RESP PANEL BY RT-PCR (FLU A&B, COVID) [id number]  HEPARIN LEVEL (UNFRACTIONATED)    EKG EKG Interpretation  Date/Time:  Tuesday June 15 2020 18:58:21 EST Ventricular Rate:  77 PR  Interval:  168 QRS Duration: 100 QT Interval:  390 QTC Calculation: 441 R Axis:   120 Text Interpretation: Sinus rhythm with marked sinus arrhythmia Left posterior fascicular block Possible Anterior infarct , age undetermined Abnormal ECG no prior available for comparison Confirmed by [id number] [phone]) on 06/15/2020 7:08:12 PM   Radiology DG Chest 2 View  Result Date: 06/15/2020 CLINICAL DATA:  Chest pain EXAM: CHEST - 2  VIEW COMPARISON:  None. FINDINGS: Mild interstitial prominence. No pleural effusion or pneumothorax. Cardiomediastinal contours are within normal limits. Mild calcified plaque along the aortic arch. No acute osseous abnormality. IMPRESSION: Age-indeterminate mild interstitial prominence, which may reflect chronic changes or mild interstitial edema. Electronically Signed   By: Guadlupe Spanish M.D.   On: 06/15/2020 20:29    Procedures Procedures (including critical care time) CRITICAL CARE Performed by: Tilden Fossa   Total critical care time: 40 minutes  Critical care time was exclusive of separately billable procedures and treating other patients.  Critical care was necessary to treat or prevent imminent or life-threatening deterioration.  Critical care was time spent personally by me on the following activities: development of treatment plan with patient and/or surrogate as well as nursing, discussions with consultants, evaluation of patient's response to treatment, examination of patient, obtaining history from patient or surrogate, ordering and performing treatments and interventions, ordering and review of laboratory studies, ordering and review of radiographic studies, pulse oximetry and re-evaluation of patient's condition.  Medications Ordered in ED Medications  aspirin chewable tablet 324 mg (324 mg Oral Not Given 06/15/20 2138)  heparin ADULT infusion 100 units/mL (25000 units/232mL sodium chloride 0.45%) (1,000 Units/hr Intravenous New Bag/Given  06/15/20 2136)  heparin bolus via infusion 4,000 Units (4,000 Units Intravenous Bolus from Bag 06/15/20 2138)    ED Course  I have reviewed the triage vital signs and the nursing notes.  Pertinent labs & imaging results that were available during my care of the patient were reviewed by me and considered in my medical decision making (see chart for details).    MDM Rules/Calculators/A&P                          Patient here for evaluation of waxing and waning chest tightness and shortness of breath. EKG without acute ischemic changes, no priors available for comparison. BMP with hyperglycemia, stable renal insufficiency. Initial troponin significantly elevated at greater than 1000. Discussed with Dr. Okey Dupre with cardiology, recommends medicine admission for an STEMI. Discussed with patient and son findings of studies and recommendation for admission and they are in agreement with treatment plan. Hospitalist consulted for admission. Final Clinical Impression(s) / ED Diagnoses Final diagnoses:  NSTEMI (non-ST elevated myocardial infarction) [facility])    Rx / DC Orders ED Discharge Orders    None       Tilden Fossa, MD 06/15/20 2233

## 2020-06-15 NOTE — ED Notes (Signed)
Date and time results received: 06/15/20 2229  Test: troponin Critical Value: 6026  Name of Provider Notified: Dr. Madilyn Hook  Orders Received? Or Actions Taken?: no new orders

## 2020-06-15 NOTE — Progress Notes (Signed)
ANTICOAGULATION CONSULT NOTE - Initial Consult  Pharmacy Consult for heparin Indication: chest pain/ACS  Allergies  Allergen Reactions  . Cefaclor   . Codeine   . Penicillins   . Sulfa Antibiotics   . Trazodone And Nefazodone   . Vibramycin [Doxycycline Calcium]     Patient Measurements: Height: 5\' 5"  (165.1 cm) Weight: 105.7 kg (233 lb) IBW/kg (Calculated) : 57 Heparin Dosing Weight: 81.6kg  Vital Signs: Temp: 98.3 F (36.8 C) (11/23 1906) Temp Source: Oral (11/23 1906) BP: 129/81 (11/23 2105) Pulse Rate: 85 (11/23 2105)  Labs: Recent Labs    06/15/20 1921  HGB 13.4  HCT 40.6  PLT 272  CREATININE 1.08*  TROPONINIHS 1,069*    Estimated Creatinine Clearance: 56 mL/min (A) (by C-G formula based on SCr of 1.08 mg/dL (H)).   Medical History: Past Medical History:  Diagnosis Date  . Arthritis   . Asthma   . Diabetes mellitus without complication (HCC)   . Hypercholesteremia   . Hypertension   . Hypothyroidism     Medications:  Infusions:  . heparin      Assessment: 36 yof presented to the ED with CP and SOB. Troponin elevated and now starting IV heparin. Baseline CBC is WNL. She is not on anticoagulation PTA.   Goal of Therapy:  Heparin level 0.3-0.7 units/ml Monitor platelets by anticoagulation protocol: Yes   Plan:  Heparin bolus 4000 units IV x 1 Heparin gtt 1000 units/hr Check an 8 hr heparin level Daily heparin level and CBC  Jahmiya Guidotti, 66 06/15/2020,9:20 PM

## 2020-06-15 NOTE — ED Triage Notes (Addendum)
Pt c/o CP, SOB started ~4pm today-dyspnea noted when answering triage ?s-pt denies fever/flu sx

## 2020-06-16 ENCOUNTER — Encounter (HOSPITAL_COMMUNITY)
Admission: EM | Disposition: A | Discharge status: Home Health | Payer: Self-pay | Source: Home / Self Care | Attending: Cardiothoracic Surgery

## 2020-06-16 ENCOUNTER — Inpatient Hospital Stay (HOSPITAL_COMMUNITY): Payer: Medicare HMO

## 2020-06-16 ENCOUNTER — Encounter (HOSPITAL_COMMUNITY): Payer: Self-pay | Admitting: Internal Medicine

## 2020-06-16 DIAGNOSIS — E039 Hypothyroidism, unspecified: Secondary | ICD-10-CM | POA: Diagnosis present

## 2020-06-16 DIAGNOSIS — F419 Anxiety disorder, unspecified: Secondary | ICD-10-CM | POA: Diagnosis present

## 2020-06-16 DIAGNOSIS — I248 Other forms of acute ischemic heart disease: Secondary | ICD-10-CM | POA: Diagnosis not present

## 2020-06-16 DIAGNOSIS — I5021 Acute systolic (congestive) heart failure: Secondary | ICD-10-CM | POA: Diagnosis not present

## 2020-06-16 DIAGNOSIS — E669 Obesity, unspecified: Secondary | ICD-10-CM | POA: Diagnosis not present

## 2020-06-16 DIAGNOSIS — I214 Non-ST elevation (NSTEMI) myocardial infarction: Principal | ICD-10-CM

## 2020-06-16 DIAGNOSIS — Z01810 Encounter for preprocedural cardiovascular examination: Secondary | ICD-10-CM | POA: Diagnosis not present

## 2020-06-16 DIAGNOSIS — R079 Chest pain, unspecified: Secondary | ICD-10-CM | POA: Diagnosis not present

## 2020-06-16 DIAGNOSIS — G4733 Obstructive sleep apnea (adult) (pediatric): Secondary | ICD-10-CM | POA: Diagnosis present

## 2020-06-16 DIAGNOSIS — I110 Hypertensive heart disease with heart failure: Secondary | ICD-10-CM | POA: Diagnosis present

## 2020-06-16 DIAGNOSIS — R001 Bradycardia, unspecified: Secondary | ICD-10-CM | POA: Diagnosis not present

## 2020-06-16 DIAGNOSIS — I10 Essential (primary) hypertension: Secondary | ICD-10-CM

## 2020-06-16 DIAGNOSIS — E1121 Type 2 diabetes mellitus with diabetic nephropathy: Secondary | ICD-10-CM

## 2020-06-16 DIAGNOSIS — E7800 Pure hypercholesterolemia, unspecified: Secondary | ICD-10-CM | POA: Diagnosis present

## 2020-06-16 DIAGNOSIS — E785 Hyperlipidemia, unspecified: Secondary | ICD-10-CM

## 2020-06-16 DIAGNOSIS — E1165 Type 2 diabetes mellitus with hyperglycemia: Secondary | ICD-10-CM

## 2020-06-16 DIAGNOSIS — Z794 Long term (current) use of insulin: Secondary | ICD-10-CM | POA: Diagnosis not present

## 2020-06-16 DIAGNOSIS — Z79899 Other long term (current) drug therapy: Secondary | ICD-10-CM | POA: Diagnosis not present

## 2020-06-16 DIAGNOSIS — Z791 Long term (current) use of non-steroidal anti-inflammatories (NSAID): Secondary | ICD-10-CM | POA: Diagnosis not present

## 2020-06-16 DIAGNOSIS — D72829 Elevated white blood cell count, unspecified: Secondary | ICD-10-CM

## 2020-06-16 DIAGNOSIS — Z7982 Long term (current) use of aspirin: Secondary | ICD-10-CM | POA: Diagnosis not present

## 2020-06-16 DIAGNOSIS — J45901 Unspecified asthma with (acute) exacerbation: Secondary | ICD-10-CM | POA: Diagnosis present

## 2020-06-16 DIAGNOSIS — Z79890 Hormone replacement therapy (postmenopausal): Secondary | ICD-10-CM | POA: Diagnosis not present

## 2020-06-16 DIAGNOSIS — I2510 Atherosclerotic heart disease of native coronary artery without angina pectoris: Secondary | ICD-10-CM

## 2020-06-16 DIAGNOSIS — D72828 Other elevated white blood cell count: Secondary | ICD-10-CM | POA: Diagnosis present

## 2020-06-16 DIAGNOSIS — E782 Mixed hyperlipidemia: Secondary | ICD-10-CM | POA: Diagnosis not present

## 2020-06-16 DIAGNOSIS — R9431 Abnormal electrocardiogram [ECG] [EKG]: Secondary | ICD-10-CM

## 2020-06-16 DIAGNOSIS — E1140 Type 2 diabetes mellitus with diabetic neuropathy, unspecified: Secondary | ICD-10-CM | POA: Diagnosis present

## 2020-06-16 DIAGNOSIS — F32A Depression, unspecified: Secondary | ICD-10-CM | POA: Diagnosis present

## 2020-06-16 DIAGNOSIS — R110 Nausea: Secondary | ICD-10-CM

## 2020-06-16 DIAGNOSIS — I5041 Acute combined systolic (congestive) and diastolic (congestive) heart failure: Secondary | ICD-10-CM | POA: Diagnosis present

## 2020-06-16 DIAGNOSIS — E1169 Type 2 diabetes mellitus with other specified complication: Secondary | ICD-10-CM

## 2020-06-16 DIAGNOSIS — Z20822 Contact with and (suspected) exposure to covid-19: Secondary | ICD-10-CM | POA: Diagnosis present

## 2020-06-16 DIAGNOSIS — I25110 Atherosclerotic heart disease of native coronary artery with unstable angina pectoris: Secondary | ICD-10-CM | POA: Diagnosis not present

## 2020-06-16 DIAGNOSIS — Z6839 Body mass index (BMI) 39.0-39.9, adult: Secondary | ICD-10-CM | POA: Diagnosis not present

## 2020-06-16 HISTORY — PX: LEFT HEART CATH AND CORONARY ANGIOGRAPHY: CATH118249

## 2020-06-16 HISTORY — PX: TRANSTHORACIC ECHOCARDIOGRAM: SHX275

## 2020-06-16 LAB — COMPREHENSIVE METABOLIC PANEL
ALT: 16 U/L (ref 0–44)
AST: 32 U/L (ref 15–41)
Albumin: 3.3 g/dL — ABNORMAL LOW (ref 3.5–5.0)
Alkaline Phosphatase: 52 U/L (ref 38–126)
Anion gap: 11 (ref 5–15)
BUN: 12 mg/dL (ref 8–23)
CO2: 24 mmol/L (ref 22–32)
Calcium: 9 mg/dL (ref 8.9–10.3)
Chloride: 103 mmol/L (ref 98–111)
Creatinine, Ser: 1.07 mg/dL — ABNORMAL HIGH (ref 0.44–1.00)
GFR, Estimated: 55 mL/min — ABNORMAL LOW (ref >60)
Glucose, Bld: 202 mg/dL — ABNORMAL HIGH (ref 70–99)
Potassium: 4.2 mmol/L (ref 3.5–5.1)
Sodium: 138 mmol/L (ref 135–145)
Total Bilirubin: 1 mg/dL (ref 0.3–1.2)
Total Protein: 6.7 g/dL (ref 6.5–8.1)

## 2020-06-16 LAB — GLUCOSE, CAPILLARY
Glucose-Capillary: 190 mg/dL — ABNORMAL HIGH (ref 70–99)
Glucose-Capillary: 168 mg/dL — ABNORMAL HIGH (ref 70–99)
Glucose-Capillary: 150 mg/dL — ABNORMAL HIGH (ref 70–99)
Glucose-Capillary: 228 mg/dL — ABNORMAL HIGH (ref 70–99)
Glucose-Capillary: 292 mg/dL — ABNORMAL HIGH (ref 70–99)
Glucose-Capillary: 260 mg/dL — ABNORMAL HIGH (ref 70–99)
Glucose-Capillary: 229 mg/dL — ABNORMAL HIGH (ref 70–99)

## 2020-06-16 LAB — HEMOGLOBIN A1C
Hgb A1c MFr Bld: 9 % — ABNORMAL HIGH (ref 4.8–5.6)
Mean Plasma Glucose: 211.6 mg/dL

## 2020-06-16 LAB — CBC
HCT: 39.8 % (ref 36.0–46.0)
HCT: 38 % (ref 36.0–46.0)
Hemoglobin: 12.8 g/dL (ref 12.0–15.0)
Hemoglobin: 12.7 g/dL (ref 12.0–15.0)
MCH: 27.9 pg (ref 26.0–34.0)
MCH: 29.5 pg (ref 26.0–34.0)
MCHC: 32.2 g/dL (ref 30.0–36.0)
MCHC: 33.4 g/dL (ref 30.0–36.0)
MCV: 86.9 fL (ref 80.0–100.0)
MCV: 88.2 fL (ref 80.0–100.0)
Platelets: 272 10*3/uL (ref 150–400)
Platelets: 266 10*3/uL (ref 150–400)
RBC: 4.58 MIL/uL (ref 3.87–5.11)
RBC: 4.31 MIL/uL (ref 3.87–5.11)
RDW: 14 % (ref 11.5–15.5)
RDW: 13.8 % (ref 11.5–15.5)
WBC: 12.8 10*3/uL — ABNORMAL HIGH (ref 4.0–10.5)
WBC: 11.6 10*3/uL — ABNORMAL HIGH (ref 4.0–10.5)
nRBC: 0 % (ref 0.0–0.2)
nRBC: 0 % (ref 0.0–0.2)

## 2020-06-16 LAB — TROPONIN I (HIGH SENSITIVITY)
Troponin I (High Sensitivity): 9657 ng/L (ref <18)
Troponin I (High Sensitivity): 10270 ng/L (ref <18)
Troponin I (High Sensitivity): 10433 ng/L (ref <18)
Troponin I (High Sensitivity): 9038 ng/L (ref <18)

## 2020-06-16 LAB — APTT: aPTT: 56 seconds — ABNORMAL HIGH (ref 24–36)

## 2020-06-16 LAB — ECHOCARDIOGRAM COMPLETE
Area-P 1/2: 3.31 cm2
Calc EF: 42.7 %
Height: 65 in
S' Lateral: 3.1 cm
Single Plane A2C EF: 39 %
Single Plane A4C EF: 47.4 %
Weight: 3625.6 oz

## 2020-06-16 LAB — LIPID PANEL
Cholesterol: 139 mg/dL (ref 0–200)
HDL: 34 mg/dL — ABNORMAL LOW (ref >40)
LDL Cholesterol: 82 mg/dL (ref 0–99)
Total CHOL/HDL Ratio: 4.1 RATIO
Triglycerides: 116 mg/dL (ref <150)
VLDL: 23 mg/dL (ref 0–40)

## 2020-06-16 LAB — MAGNESIUM: Magnesium: 1.4 mg/dL — ABNORMAL LOW (ref 1.7–2.4)

## 2020-06-16 LAB — HEPARIN LEVEL (UNFRACTIONATED): Heparin Unfractionated: 0.18 IU/mL — ABNORMAL LOW (ref 0.30–0.70)

## 2020-06-16 LAB — PHOSPHORUS: Phosphorus: 3.6 mg/dL (ref 2.5–4.6)

## 2020-06-16 LAB — PROTIME-INR
INR: 1.1 (ref 0.8–1.2)
Prothrombin Time: 14.2 seconds (ref 11.4–15.2)

## 2020-06-16 SURGERY — LEFT HEART CATH AND CORONARY ANGIOGRAPHY
Anesthesia: LOCAL

## 2020-06-16 MED ORDER — ALBUTEROL SULFATE HFA 108 (90 BASE) MCG/ACT IN AERS
2.0000 | INHALATION_SPRAY | Freq: Four times a day (QID) | RESPIRATORY_TRACT | Status: DC | PRN
  Filled 2020-06-16: qty 6.7

## 2020-06-16 MED ORDER — VERAPAMIL HCL 2.5 MG/ML IV SOLN
INTRAVENOUS | Status: DC | PRN
  Administered 2020-06-16: 10 mL via INTRA_ARTERIAL

## 2020-06-16 MED ORDER — SODIUM CHLORIDE 0.9 % WEIGHT BASED INFUSION: 1.0000 mL/kg/h | INTRAVENOUS | Status: DC

## 2020-06-16 MED ORDER — LABETALOL HCL 5 MG/ML IV SOLN: 10.0000 mg | INTRAVENOUS | Status: AC | PRN

## 2020-06-16 MED ORDER — IPRATROPIUM-ALBUTEROL 0.5-2.5 (3) MG/3ML IN SOLN: 3.0000 mL | Freq: Four times a day (QID) | RESPIRATORY_TRACT | Status: DC | PRN

## 2020-06-16 MED ORDER — SODIUM CHLORIDE 0.9 % IV SOLN: 250.0000 mL | INTRAVENOUS | Status: DC | PRN

## 2020-06-16 MED ORDER — MIDAZOLAM HCL 2 MG/2ML IJ SOLN
INTRAMUSCULAR | Status: AC
  Filled 2020-06-16: qty 2

## 2020-06-16 MED ORDER — ACETAMINOPHEN 325 MG PO TABS: 650.0000 mg | ORAL_TABLET | ORAL | Status: DC | PRN

## 2020-06-16 MED ORDER — SODIUM CHLORIDE 0.9% FLUSH: 3.0000 mL | INTRAVENOUS | Status: DC | PRN

## 2020-06-16 MED ORDER — GABAPENTIN 300 MG PO CAPS
300.0000 mg | ORAL_CAPSULE | Freq: Every day | ORAL | Status: DC
  Administered 2020-06-16 – 2020-06-17 (×2): 300 mg via ORAL
  Filled 2020-06-16 (×2): qty 1

## 2020-06-16 MED ORDER — HEPARIN (PORCINE) 25000 UT/250ML-% IV SOLN
1300.0000 [IU]/h | INTRAVENOUS | Status: DC
  Administered 2020-06-16: 1150 [IU]/h via INTRAVENOUS
  Administered 2020-06-17: 1300 [IU]/h via INTRAVENOUS
  Filled 2020-06-16 (×2): qty 250

## 2020-06-16 MED ORDER — ASPIRIN EC 81 MG PO TBEC
81.0000 mg | DELAYED_RELEASE_TABLET | Freq: Every day | ORAL | Status: DC
  Administered 2020-06-17: 81 mg via ORAL
  Filled 2020-06-16: qty 1

## 2020-06-16 MED ORDER — LORAZEPAM 1 MG PO TABS
1.0000 mg | ORAL_TABLET | Freq: Every evening | ORAL | Status: DC | PRN
  Administered 2020-06-16 (×2): 1 mg via ORAL
  Filled 2020-06-16: qty 1

## 2020-06-16 MED ORDER — INSULIN GLARGINE 100 UNIT/ML ~~LOC~~ SOLN
5.0000 [IU] | Freq: Every day | SUBCUTANEOUS | Status: DC
  Administered 2020-06-16: 5 [IU] via SUBCUTANEOUS
  Filled 2020-06-16 (×2): qty 0.05

## 2020-06-16 MED ORDER — CARVEDILOL 6.25 MG PO TABS
6.2500 mg | ORAL_TABLET | Freq: Two times a day (BID) | ORAL | Status: DC
  Administered 2020-06-16 – 2020-06-17 (×4): 6.25 mg via ORAL
  Filled 2020-06-16 (×4): qty 1

## 2020-06-16 MED ORDER — SODIUM CHLORIDE 0.9% FLUSH
3.0000 mL | Freq: Two times a day (BID) | INTRAVENOUS | Status: DC
  Administered 2020-06-16 – 2020-06-17 (×3): 3 mL via INTRAVENOUS

## 2020-06-16 MED ORDER — MONTELUKAST SODIUM 10 MG PO TABS
10.0000 mg | ORAL_TABLET | Freq: Every day | ORAL | Status: DC
  Administered 2020-06-16 – 2020-06-17 (×2): 10 mg via ORAL
  Filled 2020-06-16 (×2): qty 1

## 2020-06-16 MED ORDER — HEPARIN SODIUM (PORCINE) 1000 UNIT/ML IJ SOLN
INTRAMUSCULAR | Status: AC
  Filled 2020-06-16: qty 1

## 2020-06-16 MED ORDER — FLUTICASONE PROPIONATE 50 MCG/ACT NA SUSP
2.0000 | Freq: Every day | NASAL | Status: DC
  Administered 2020-06-16 – 2020-06-17 (×2): 2 via NASAL
  Filled 2020-06-16: qty 16

## 2020-06-16 MED ORDER — SPIRONOLACTONE 25 MG PO TABS
25.0000 mg | ORAL_TABLET | Freq: Every day | ORAL | Status: DC
  Administered 2020-06-16 – 2020-06-17 (×2): 25 mg via ORAL
  Filled 2020-06-16 (×2): qty 1

## 2020-06-16 MED ORDER — HEPARIN SODIUM (PORCINE) 1000 UNIT/ML IJ SOLN
INTRAMUSCULAR | Status: DC | PRN
  Administered 2020-06-16: 5000 [IU] via INTRAVENOUS

## 2020-06-16 MED ORDER — VERAPAMIL HCL 2.5 MG/ML IV SOLN
INTRAVENOUS | Status: AC
  Filled 2020-06-16: qty 2

## 2020-06-16 MED ORDER — HEPARIN (PORCINE) IN NACL 1000-0.9 UT/500ML-% IV SOLN
INTRAVENOUS | Status: AC
  Filled 2020-06-16: qty 1000

## 2020-06-16 MED ORDER — ONDANSETRON HCL 4 MG/2ML IJ SOLN: 4.0000 mg | Freq: Four times a day (QID) | INTRAMUSCULAR | Status: DC | PRN

## 2020-06-16 MED ORDER — ATORVASTATIN CALCIUM 80 MG PO TABS
80.0000 mg | ORAL_TABLET | Freq: Every day | ORAL | Status: DC
  Administered 2020-06-16 – 2020-06-25 (×9): 80 mg via ORAL
  Filled 2020-06-16 (×9): qty 1

## 2020-06-16 MED ORDER — LIDOCAINE HCL (PF) 1 % IJ SOLN
INTRAMUSCULAR | Status: AC
  Filled 2020-06-16: qty 30

## 2020-06-16 MED ORDER — PROCHLORPERAZINE MALEATE 10 MG PO TABS
10.0000 mg | ORAL_TABLET | Freq: Four times a day (QID) | ORAL | Status: DC | PRN
  Filled 2020-06-16: qty 1

## 2020-06-16 MED ORDER — ASPIRIN 81 MG PO CHEW: 81.0000 mg | CHEWABLE_TABLET | Freq: Every day | ORAL | Status: DC

## 2020-06-16 MED ORDER — LEVOTHYROXINE SODIUM 137 MCG PO TABS
137.0000 ug | ORAL_TABLET | Freq: Every day | ORAL | Status: DC
  Administered 2020-06-16 – 2020-06-17 (×2): 137 ug via ORAL
  Filled 2020-06-16 (×4): qty 1

## 2020-06-16 MED ORDER — ASPIRIN 81 MG PO CHEW
81.0000 mg | CHEWABLE_TABLET | ORAL | Status: AC
  Administered 2020-06-16: 81 mg via ORAL
  Filled 2020-06-16: qty 1

## 2020-06-16 MED ORDER — FENTANYL CITRATE (PF) 100 MCG/2ML IJ SOLN
INTRAMUSCULAR | Status: AC
  Filled 2020-06-16: qty 2

## 2020-06-16 MED ORDER — IOHEXOL 350 MG/ML SOLN
INTRAVENOUS | Status: DC | PRN
  Administered 2020-06-16: 55 mL via INTRA_ARTERIAL

## 2020-06-16 MED ORDER — DIPHENHYDRAMINE HCL 50 MG/ML IJ SOLN
INTRAMUSCULAR | Status: AC
  Filled 2020-06-16: qty 1

## 2020-06-16 MED ORDER — INSULIN ASPART 100 UNIT/ML ~~LOC~~ SOLN
0.0000 [IU] | SUBCUTANEOUS | Status: DC
  Administered 2020-06-16: 2 [IU] via SUBCUTANEOUS
  Administered 2020-06-16: 8 [IU] via SUBCUTANEOUS
  Administered 2020-06-16: 5 [IU] via SUBCUTANEOUS
  Administered 2020-06-16: 8 [IU] via SUBCUTANEOUS
  Administered 2020-06-16: 5 [IU] via SUBCUTANEOUS
  Administered 2020-06-16: 3 [IU] via SUBCUTANEOUS
  Administered 2020-06-17: 5 [IU] via SUBCUTANEOUS
  Administered 2020-06-17: 3 [IU] via SUBCUTANEOUS
  Administered 2020-06-17: 11 [IU] via SUBCUTANEOUS
  Administered 2020-06-17: 3 [IU] via SUBCUTANEOUS
  Administered 2020-06-17 – 2020-06-18 (×2): 8 [IU] via SUBCUTANEOUS

## 2020-06-16 MED ORDER — PERFLUTREN LIPID MICROSPHERE
1.0000 mL | INTRAVENOUS | Status: AC | PRN
  Administered 2020-06-16: 1.5 mL via INTRAVENOUS
  Filled 2020-06-16: qty 10

## 2020-06-16 MED ORDER — INSULIN NPH (HUMAN) (ISOPHANE) 100 UNIT/ML ~~LOC~~ SUSP
10.0000 [IU] | Freq: Once | SUBCUTANEOUS | Status: DC
  Filled 2020-06-16: qty 10

## 2020-06-16 MED ORDER — SODIUM CHLORIDE 0.9 % WEIGHT BASED INFUSION
3.0000 mL/kg/h | INTRAVENOUS | Status: DC
  Administered 2020-06-16: 3 mL/kg/h via INTRAVENOUS

## 2020-06-16 MED ORDER — LOSARTAN POTASSIUM 50 MG PO TABS
100.0000 mg | ORAL_TABLET | Freq: Every day | ORAL | Status: DC
  Administered 2020-06-17: 100 mg via ORAL
  Filled 2020-06-16: qty 2

## 2020-06-16 MED ORDER — SODIUM CHLORIDE 0.9% FLUSH: 3.0000 mL | Freq: Two times a day (BID) | INTRAVENOUS | Status: DC

## 2020-06-16 MED ORDER — FENTANYL CITRATE (PF) 100 MCG/2ML IJ SOLN
INTRAMUSCULAR | Status: DC | PRN
  Administered 2020-06-16: 25 ug via INTRAVENOUS

## 2020-06-16 MED ORDER — LIDOCAINE HCL (PF) 1 % IJ SOLN
INTRAMUSCULAR | Status: DC | PRN
  Administered 2020-06-16: 2 mL

## 2020-06-16 MED ORDER — DIPHENHYDRAMINE HCL 50 MG/ML IJ SOLN
INTRAMUSCULAR | Status: DC | PRN
  Administered 2020-06-16: 25 mg via INTRAVENOUS

## 2020-06-16 MED ORDER — SIMVASTATIN 20 MG PO TABS: 20.0000 mg | ORAL_TABLET | Freq: Every day | ORAL | Status: DC

## 2020-06-16 MED ORDER — FLUTICASONE FUROATE-VILANTEROL 100-25 MCG/INH IN AEPB
1.0000 | INHALATION_SPRAY | Freq: Every day | RESPIRATORY_TRACT | Status: DC
  Administered 2020-06-17: 08:00:00 1 via RESPIRATORY_TRACT
  Filled 2020-06-16: qty 28

## 2020-06-16 MED ORDER — MIDAZOLAM HCL 2 MG/2ML IJ SOLN
INTRAMUSCULAR | Status: DC | PRN
  Administered 2020-06-16: 2 mg via INTRAVENOUS

## 2020-06-16 MED ORDER — HEPARIN (PORCINE) IN NACL 1000-0.9 UT/500ML-% IV SOLN
INTRAVENOUS | Status: DC | PRN
  Administered 2020-06-16 (×3): 500 mL

## 2020-06-16 MED ORDER — HYDRALAZINE HCL 20 MG/ML IJ SOLN: 10.0000 mg | INTRAMUSCULAR | Status: AC | PRN

## 2020-06-16 SURGICAL SUPPLY — 11 items
CATH 5FR JL3.5 JR4 ANG PIG MP (CATHETERS) ×1 IMPLANT
DEVICE RAD COMP TR BAND LRG (VASCULAR PRODUCTS) ×1 IMPLANT
GLIDESHEATH SLEND SS 6F .021 (SHEATH) ×1 IMPLANT
GUIDEWIRE INQWIRE 1.5J.035X260 (WIRE) IMPLANT
INQWIRE 1.5J .035X260CM (WIRE) ×2
KIT HEART LEFT (KITS) ×2 IMPLANT
PACK CARDIAC CATHETERIZATION (CUSTOM PROCEDURE TRAY) ×2 IMPLANT
SHEATH PROBE COVER 6X72 (BAG) ×1 IMPLANT
SYR MEDRAD MARK 7 150ML (SYRINGE) ×2 IMPLANT
TRANSDUCER W/STOPCOCK (MISCELLANEOUS) ×2 IMPLANT
TUBING CIL FLEX 10 FLL-RA (TUBING) ×2 IMPLANT

## 2020-06-16 NOTE — Progress Notes (Addendum)
    Patient seen in consultation this morning by overnight MD. Admitted with NSTEMI [place] [address] this morning. Remains on IV heparin. Remains chest pain free. Plan for cardiac cath today.  -- The patient understands that risks included but are not limited to stroke (1 in 1000), death (1 in 1000), kidney failure [usually temporary] (1 in 500), bleeding (1 in 200), allergic reaction [possibly serious] (1 in 200).  -- continue ASA, statin, BB. Will ask RN to hold morning dose of losartan with plans for cath -- echo pending  General: Well developed, well nourished, female appearing in no acute distress. Head: Normocephalic, atraumatic.  Neck: Supple without bruits, JVD. Lungs:  Resp regular and unlabored, CTA. Heart: RRR, S1, S2, no S3, S4, or murmur; no rub. Abdomen: Soft, non-tender, non-distended with normoactive bowel sounds. No hepatomegaly. No rebound/guarding. No obvious abdominal masses. Extremities: No clubbing, cyanosis, edema. Distal pedal pulses are 2+ bilaterally. Neuro: Alert and oriented X 3. Moves all extremities spontaneously. Psych: Normal affect.   Janice Coffin, NP-C 06/16/2020, 7:54 AM Pager: [phone]   ATTENDING ATTESTATION  I have seen, examined and evaluated the patient this AM along with  Laverda Page, NP-C.  After reviewing all the available data and chart, we discussed the patients laboratory, study & physical findings as well as symptoms in detail. I agree with her findings, examination as well as impression recommendations as per our discussion.     I reviewed the H&P.  Relatively unusual presentation, troponin levels are consistent with non-STEMI.  I agree that best course of action is to proceed with cardiac catheterization.  She has relatively significant family history with a brother having CABG and she is morbidly obese with hypertension hyperlipidemia and diabetes mellitus-2.  -> She is on carvedilol, losartan and spironolactone at  home, we have converted from simvastatin to high-dose atorvastatin. Continue IV heparin until cath.  We will defer to [facility] for management of diabetes but agree that GLP-1 agonist would be beneficial given her obesity and suboptimal diabetic control.   Patient is in agreement.    Bryan Lemma, M.D., M.S. Interventional Cardiologist   Pager # [phone] Phone # [phone] [address] [place], [place] [address]

## 2020-06-16 NOTE — Progress Notes (Addendum)
PROGRESS NOTE    Angela Frazier  EKC:[id number] DOB: 1946-02-08 DOA: 06/15/2020 PCP: System, Provider Not In    Brief Narrative:  Angela Frazier is a 74 year old female with past medical history notable for essential hypertension, hyperlipidemia, OSA, hypothyroidism, asthma, type 2 diabetes mellitus who presented to the [facility] ED with complaint of chest pain.  Chest pain localized to the center of chest described as a tightness with radiation to the back of the left arm.  Chest pain worsened after assisting family member that fell.  Associated with nausea.  No vomiting, no fever/chills, no cough, no abdominal pain.  In the ED, BP 190/83, glucose 353.  Troponin [id number].  Chest x-ray showed age-indeterminate mild interstitial prominence reflective of chronic changes versus mild interstitial edema.  EKG with NSR, rate 93 with no acute changes.  ED physician consulted cardiology, Dr. Okey Dupre who recommend admitting patient for NSTEMI.  Patient started on IV heparin drip and transferred to [facility] under hospital service for further evaluation and management.   Assessment & Plan:   Principal Problem:   NSTEMI (non-ST elevated myocardial infarction) (HCC) Active Problems:   Chest pain   Nausea   Obesity (BMI 30-39.9)   Essential hypertension   Hyperlipidemia   Hypothyroidism   Asthma, chronic, unspecified asthma severity, with acute exacerbation   Hyperglycemia due to diabetes mellitus (HCC)   Leukocytosis   Prolonged QT interval   NSTEMI Multi-vessel CAD Patient presenting to the ED with acute onset chest pain while assisting a family member who fell.  Pain localized to the center of her chest with radiation to the left arm associate with nausea.  Troponin on presentation elevated at 1069.  EKG with normal sinus rhythm with no concerning ST elevation/depression or T wave inversions.  Patient was started on IV heparin drip and transferred to [facility] for cardiology evaluation. --Troponin  [id number] --LHC 11/24 w/ distal RCA 100% stenosis, mid RCA 50% stenosed, proximal and mid Cx 80% stenosed, 1rst Marg 75% stenosed, Ost LAD to proximal LAD 90% stenosed, RPDA 80% stenosed, RPAV 80% stenosed, LVEF 35-45%. --Continue heparin drip --Atorvastatin 80 mg p.o. daily --Aspirin 81 mg p.o. daily --Cardio thoracic surgery consulted, plan CABG 06/18/2020 --Continue to monitor on telemetry  Heart failure with reduced EF Essential hypertension LVEF noted on LHC 35-40%. --TTE: Pending --Carvedilol 6.25 mg p.o. twice daily --Losartan 100 mg p.o. daily --Spironolactone 25 mg p.o. daily --Strict I's and O's Daily weights  Type 2 diabetes mellitus Hemoglobin A1c 9.0, poorly controlled.  Home regimen includes Metformin 1000 mg p.o. twice daily and NovoLog 70/30 twice daily --Hold oral hypoglycemics while inpatient --Start Lantus 5 units subcutaneously qHS --Moderate insulin sliding scale for coverage --CBGs qAC/HS --Continue monitor glucose and adjust accordingly  Peripheral neuropathy: Continue gabapentin 300 mg p.o. nightly  Depression/anxiety: --Continue Lexapro 20 mg p.o. daily --ativan 1mg  PO qHS prn  Hyperlipidemia Home simvastatin changed for atorvastatin 80 mg p.o. daily due to significant CAD  Hypothyroidism: Continue levothyroxine 137 mcg p.o. daily  Asthma: --Continue Symbicort (w/ Breo Elipta as hospital substitution) and Singulair --Albuterol MDI as needed   DVT prophylaxis: Heparin drip Code Status: Full code Family Communication: No family present at bedside this morning  Disposition Plan:  Status is: Inpatient  Remains inpatient appropriate because:Ongoing diagnostic testing needed not appropriate for outpatient work up, Unsafe d/c plan, IV treatments appropriate due to intensity of illness or inability to take PO and Inpatient level of care appropriate due to severity of illness   Dispo:  The patient is from: Home               Anticipated d/c is to: To be determined              Anticipated d/c date is: > 3 days              Patient currently is not medically stable to d/c.  Pending CABG 11/26   Consultants:   Cardiology  Cardiothoracic surgery  Procedures:   Left heart catheterization 06/16/2020, Dr. Eldridge Dace  Dist RCA lesion is 100% stenosed. Left to right collaterals.  Mid RCA lesion is 50% stenosed.  Prox Cx lesion is 80% stenosed.  Mid Cx lesion is 80% stenosed.  1st Mrg lesion is 75% stenosed. This is a small vessel and may not be a target for CABG. The OM2 is a large vessel that is likely a better bypass target.  Ost LAD to Prox LAD lesion is 90% stenosed.  There is moderate left ventricular systolic dysfunction.  LV end diastolic pressure is moderately elevated.  The left ventricular ejection fraction is 35-45% by visual estimate.  There is no aortic valve stenosis.  RPDA lesion is 80% stenosed.  RPAV lesion is 80% stenosed.   Antimicrobials:   None   Subjective: Patient seen and examined bedside, resting comfortably.  Sitting at edge of bed.  Awaiting heart catheterization later this morning.  No specific complaints or concerns at this time.  Denies headache, no visual changes, no active chest pain, no palpitations, no shortness of breath, no abdominal pain, no weakness, no fatigue, no paresthesias, no fever/chills/night sweats, no nausea/vomiting/diarrhea.  Objective: Vitals:   06/16/20 1143 06/16/20 1148 06/16/20 1153 06/16/20 1201  BP: 109/64   126/62  Pulse: 70 (!) 0 (!) 0 73  Resp: 20 (!) 0 (!) 47 19  Temp:      TempSrc:      SpO2: 93% 92% (!) 0%   Weight:      Height:        Intake/Output Summary (Last 24 hours) at 06/16/2020 1449 Last data filed at 06/16/2020 0548 Gross per 24 hour  Intake 121.54 ml  Output --  Net 121.54 ml   Filed Weights   06/15/20 1913 06/16/20 0030  Weight: 105.7 kg 102.8 kg    Examination:  General exam: Appears calm and  comfortable  Respiratory system: Clear to auscultation. Respiratory effort normal.  Oxygenating well on room air Cardiovascular system: S1 & S2 heard, RRR. No JVD, murmurs, rubs, gallops or clicks. No pedal edema. Gastrointestinal system: Abdomen is nondistended, soft and nontender. No organomegaly or masses felt. Normal bowel sounds heard. Central nervous system: Alert and oriented. No focal neurological deficits. Extremities: Symmetric 5 x 5 power. Skin: No rashes, lesions or ulcers Psychiatry: Judgement and insight appear normal. Mood & affect appropriate.     Data Reviewed: I have personally reviewed following labs and imaging studies  CBC: Recent Labs  Lab 06/15/20 1921 06/16/20 0123 06/16/20 0232  WBC 13.0* 12.8* 11.6*  HGB 13.4 12.8 12.7  HCT 40.6 39.8 38.0  MCV 88.1 86.9 88.2  PLT 272 272 266   Basic Metabolic Panel: Recent Labs  Lab 06/15/20 1921 06/16/20 0232  NA 134* 138  K 3.8 4.2  CL 103 103  CO2 19* 24  GLUCOSE 353* 202*  BUN 17 12  CREATININE 1.08* 1.07*  CALCIUM 8.6* 9.0  MG  --  1.4*  PHOS  --  3.6   GFR: Estimated Creatinine  Clearance: 55.7 mL/min (A) (by C-G formula based on SCr of 1.07 mg/dL (H)). Liver Function Tests: Recent Labs  Lab 06/16/20 0232  AST 32  ALT 16  ALKPHOS 52  BILITOT 1.0  PROT 6.7  ALBUMIN 3.3*   No results for input(s): LIPASE, AMYLASE in the last 168 hours. No results for input(s): AMMONIA in the last 168 hours. Coagulation Profile: Recent Labs  Lab 06/16/20 0232  INR 1.1   Cardiac Enzymes: No results for input(s): CKTOTAL, CKMB, CKMBINDEX, TROPONINI in the last 168 hours. BNP (last 3 results) No results for input(s): PROBNP in the last 8760 hours. HbA1C: Recent Labs    06/16/20 0513  HGBA1C 9.0*   CBG: Recent Labs  Lab 06/16/20 0126 06/16/20 0454 06/16/20 0758 06/16/20 1234  GLUCAP 190* 168* 150* 228*   Lipid Profile: Recent Labs    06/16/20 0513  CHOL 139  HDL 34*  LDLCALC 82  TRIG 151   CHOLHDL 4.1   Thyroid Function Tests: No results for input(s): TSH, T4TOTAL, FREET4, T3FREE, THYROIDAB in the last 72 hours. Anemia Panel: No results for input(s): VITAMINB12, FOLATE, FERRITIN, TIBC, IRON, RETICCTPCT in the last 72 hours. Sepsis Labs: No results for input(s): PROCALCITON, LATICACIDVEN in the last 168 hours.  Recent Results (from the past 240 hour(s))  Resp Panel by RT-PCR (Flu A&B, Covid) Nasopharyngeal Swab     Status: None   Collection Time: 06/15/20  9:34 PM   Specimen: Nasopharyngeal Swab; Nasopharyngeal(NP) swabs in vial transport medium  Result Value Ref Range Status   SARS Coronavirus 2 by RT PCR NEGATIVE NEGATIVE Final    Comment: (NOTE) SARS-CoV-2 target nucleic acids are NOT DETECTED.  The SARS-CoV-2 RNA is generally detectable in upper respiratory specimens during the acute phase of infection. The lowest concentration of SARS-CoV-2 viral copies this assay can detect is 138 copies/mL. A negative result does not preclude SARS-Cov-2 infection and should not be used as the sole basis for treatment or other patient management decisions. A negative result may occur with  improper specimen collection/handling, submission of specimen other than nasopharyngeal swab, presence of viral mutation(s) within the areas targeted by this assay, and inadequate number of viral copies(<138 copies/mL). A negative result must be combined with clinical observations, patient history, and epidemiological information. The expected result is Negative.  Fact Sheet for Patients:  BloggerCourse.com  Fact Sheet for Healthcare Providers:  SeriousBroker.it  This test is no t yet approved or cleared by the [place] FDA and  has been authorized for detection and/or diagnosis of SARS-CoV-2 by FDA under an Emergency Use Authorization (EUA). This EUA will remain  in effect (meaning this test can be used) for the duration of  the COVID-19 declaration under Section 564(b)(1) of the Act, 21 U.S.C.section 360bbb-3(b)(1), unless the authorization is terminated  or revoked sooner.       Influenza A by PCR NEGATIVE NEGATIVE Final   Influenza B by PCR NEGATIVE NEGATIVE Final    Comment: (NOTE) The Xpert Xpress SARS-CoV-2/FLU/RSV plus assay is intended as an aid in the diagnosis of influenza from Nasopharyngeal swab specimens and should not be used as a sole basis for treatment. Nasal washings and aspirates are unacceptable for Xpert Xpress SARS-CoV-2/FLU/RSV testing.  Fact Sheet for Patients: BloggerCourse.com  Fact Sheet for Healthcare Providers: SeriousBroker.it  This test is not yet approved or cleared by the [place] FDA and has been authorized for detection and/or diagnosis of SARS-CoV-2 by FDA under an Emergency Use Authorization (EUA). This EUA will  remain in effect (meaning this test can be used) for the duration of the COVID-19 declaration under Section 564(b)(1) of the Act, 21 U.S.C. section 360bbb-3(b)(1), unless the authorization is terminated or revoked.  Performed at [facility] [place], [address] [place], [place] [address]          Radiology Studies: DG Chest 2 View  Result Date: 06/15/2020 CLINICAL DATA:  Chest pain EXAM: CHEST - 2 VIEW COMPARISON:  None. FINDINGS: Mild interstitial prominence. No pleural effusion or pneumothorax. Cardiomediastinal contours are within normal limits. Mild calcified plaque along the aortic arch. No acute osseous abnormality. IMPRESSION: Age-indeterminate mild interstitial prominence, which may reflect chronic changes or mild interstitial edema. Electronically Signed   By: Guadlupe SpanishPraneil  Patel M.D.   On: 06/15/2020 20:29   CARDIAC CATHETERIZATION  Result Date: 06/16/2020  Dist RCA lesion is 100% stenosed. Left to right collaterals.  Mid RCA lesion is 50% stenosed.  Prox Cx lesion is 80%  stenosed.  Mid Cx lesion is 80% stenosed.  1st Mrg lesion is 75% stenosed. This is a small vessel and may not be a target for CABG. The OM2 is a large vessel that is likely a better bypass target.  Ost LAD to Prox LAD lesion is 90% stenosed.  There is moderate left ventricular systolic dysfunction.  LV end diastolic pressure is moderately elevated.  The left ventricular ejection fraction is 35-45% by visual estimate.  There is no aortic valve stenosis.  RPDA lesion is 80% stenosed.  RPAV lesion is 80% stenosed.  Severe three vessel disease. Will obtain CVTS consult for CABG.  Medical therapy for LV dysfunction.  Likely will need grafts to LAD, OM2, PDA, PLA        Scheduled Meds: . aspirin EC  81 mg Oral Daily  . atorvastatin  80 mg Oral Daily  . carvedilol  6.25 mg Oral BID WC  . fluticasone  2 spray Each Nare Daily  . fluticasone furoate-vilanterol  1 puff Inhalation Daily  . gabapentin  300 mg Oral QHS  . insulin aspart  0-15 Units Subcutaneous Q4H  . levothyroxine  137 mcg Oral QAC breakfast  . losartan  100 mg Oral Daily  . montelukast  10 mg Oral QHS  . sodium chloride flush  3 mL Intravenous Q12H  . spironolactone  25 mg Oral Daily   Continuous Infusions: . sodium chloride    . heparin       LOS: 0 days    Time spent: 38 minutes spent on chart review, discussion with nursing staff, consultants, updating family and interview/physical exam; more than 50% of that time was spent in counseling and/or coordination of care.    Alvira PhilipsEric J [place], DO [facility] Available via Epic secure chat 7am-7pm After these hours, please refer to coverage provider listed on [email or web address] 06/16/2020, 2:49 PM

## 2020-06-16 NOTE — Progress Notes (Addendum)
O2 sats 88-90 % when patient arrived back from cath lab, 2 L O2 [place] applied.  O2 sats 92% now. .    Patient wanting to sit in chair, patient helped to recliner, wheezing noted. Sats 89-90%, o2 increased to 4L. Sats now 93-94%.

## 2020-06-16 NOTE — Consult Note (Signed)
TCTS Preliminary Consult Note  Kindly asked to see pleasant 74 yo lady with NSTEMI and LHC evidence of severe, multivessel CAD for consideration of CABG. Based on images and brief review of her record, I agree this is the best medical treatment for her CAD. Operative work-up pending; full consult note to follow. Tentatively plan CABG on Friday. Nikan Ellingson Z. Vickey Sages, MD [phone]

## 2020-06-16 NOTE — Progress Notes (Signed)
O2 [place] weaned to 2L.  Sats 94-95%

## 2020-06-16 NOTE — Consult Note (Signed)
Cardiology Consult    Patient ID: Angela Frazier MRN: [id number], DOB/AGE: May 06, 1946   Admit date: 06/15/2020 Date of Consult: 06/16/2020  Primary Physician: Jim Like, MD Primary Cardiologist: none Requesting Provider: Frankey Shown, DO  Patient Profile    Angela Frazier is a 74 y.o. female with a history of hypertension, untreated OSA, and poorly-controlled type 2 diabetes complicated by neuropathy. He is being seen today for evaluation of chest pain associated with an elevated troponin.   History of Present Illness    Angela Frazier reports that this morning she was feeling fine and in her normal state of health. Her brother has been debilitated following a hospitalization for COVID-19. This afternoon, he fell while family was assisting him into a vehicle and the patient became very upset after the event. She noted that the back of her arm hurt but attributed this to bumping it while assisting her brother. Shortly after the event, she noticed that her teeth hurt more than anything. This was associated with nausea and dry heaving, as well as minor shortness of breath and very mild and transient tightness in her chest. After this she just "didn't feel right" and had her son call 911. She actually denies ever having anything she would describe as chest pain. She was told her ECG on scene was a little abnormal and she should be evaluated further in the ED. She was given 324 mg aspirin en route.  By the time she arrived to the ED all of her symptoms had resolved and she felt back to her baseline. She has felt well since then. Initial evaluation at [facility] was notable for severe hyperglycemia (353) and hypertension (190/83), though blood pressure quickly improved. ECG showed a a left posterior fasicular block, anteroseptal infarct pattern, and non-specific ST-T wave changes. Initial troponin was 1k and > 6k on repeat. She has been started on heparin and remains chest  pain-free.  Of note, she had a similar episode of her teeth hurting associated with nausea and vomiting a few weeks ago, but resolved quickly so she did not seek further evaluation. She denies any prior cardiac evaluation or history. Her brother had a CABG in his early 56s and mother had heart problems in her 43s. She has never smoked.  Past Medical History   Past Medical History:  Diagnosis Date  . Arthritis   . Asthma   . Diabetes mellitus without complication (HCC)   . Hypercholesteremia   . Hypertension   . Hypothyroidism     Past Surgical History:  Procedure Laterality Date  . THYROID SURGERY    . TUBAL LIGATION       Allergies  Allergen Reactions  . Cefaclor   . Codeine   . Penicillins   . Sulfa Antibiotics   . Trazodone And Nefazodone   . Vibramycin [Doxycycline Calcium]    Inpatient Medications    . aspirin EC  81 mg Oral Daily  . atorvastatin  80 mg Oral Daily  . carvedilol  6.25 mg Oral BID WC  . insulin aspart  0-15 Units Subcutaneous Q4H  . insulin NPH Human  10 Units Subcutaneous Once  . levothyroxine  137 mcg Oral QAC breakfast  . losartan  100 mg Oral Daily  . spironolactone  25 mg Oral Daily    Family History   Her brother had a CABG in his early 16s and mother had heart problems in her 41s.  Social History    Social History   Socioeconomic  History  . Marital status: Widowed    Spouse name: Not on file  . Number of children: Not on file  . Years of education: Not on file  . Highest education level: Not on file  Occupational History  . Not on file  Tobacco Use  . Smoking status: Never Smoker  . Smokeless tobacco: Never Used  Vaping Use  . Vaping Use: Never used  Substance and Sexual Activity  . Alcohol use: No  . Drug use: Never  . Sexual activity: Not on file  Other Topics Concern  . Not on file  Social History Narrative  . Not on file   Social Determinants of Health   Financial Resource Strain:   . Difficulty of Paying Living  Expenses: Not on file  Food Insecurity:   . Worried About Programme researcher, broadcasting/film/video in the Last Year: Not on file  . Ran Out of Food in the Last Year: Not on file  Transportation Needs:   . Lack of Transportation (Medical): Not on file  . Lack of Transportation (Non-Medical): Not on file  Physical Activity:   . Days of Exercise per Week: Not on file  . Minutes of Exercise per Session: Not on file  Stress:   . Feeling of Stress : Not on file  Social Connections:   . Frequency of Communication with Friends and Family: Not on file  . Frequency of Social Gatherings with Friends and Family: Not on file  . Attends Religious Services: Not on file  . Active Member of Clubs or Organizations: Not on file  . Attends Banker Meetings: Not on file  . Marital Status: Not on file  Intimate Partner Violence:   . Fear of Current or Ex-Partner: Not on file  . Emotionally Abused: Not on file  . Physically Abused: Not on file  . Sexually Abused: Not on file     Review of Systems    General:  No chills, fever, night sweats or weight changes.  Cardiovascular:  See HPI. Dermatological: No rash, lesions/masses Respiratory: No cough, dyspnea Urologic: No hematuria, dysuria Abdominal:   No nausea, vomiting, diarrhea, bright red blood per rectum, melena, or hematemesis Neurologic:  No visual changes, wkns, changes in mental status. All other systems reviewed and are otherwise negative except as noted above.  Physical Exam    Blood pressure (!) 148/87, pulse 80, temperature 99 F (37.2 C), temperature source Oral, resp. rate (!) 21, height 5\' 5"  (1.651 m), weight 102.8 kg, SpO2 94 %.    No intake or output data in the 24 hours ending 06/16/20 0424 Wt Readings from Last 3 Encounters:  06/16/20 102.8 kg  09/23/15 101.6 kg    CONSTITUTIONAL: alert and conversant, obese but otherwise well appearing and in no distress. HEENT: oropharynx clear and moist, no mucosal lesions, normal dentition,  conjunctiva normal, EOM intact, pupils equal, no lid lag. NECK: symmetric, no thyromegaly CARDIOVASCULAR: Regular rhythm. No gallop, murmur, or rub. Normal S1/S2. Radial pulses intact. JVP is normal. No carotid bruits. PULMONARY/CHEST WALL: no deformities, normal breath sounds bilaterally, normal work of breathing ABDOMINAL: soft, non-tender, non-distended EXTREMITIES: no edema or muscle atrophy, warm and well-perfused SKIN: Dry and intact without apparent rashes or wounds. NEUROLOGIC: alert, normal gait, no abnormal movements, cranial nerves grossly intact.   Labs    Cardiac Panel (last 3 results) Recent Labs    06/15/20 2134 06/16/20 0123 06/16/20 0232  TROPONINIHS 6,026* 06/18/20* 10,270*    Lab Results  Component  Value Date   WBC 11.6 (H) 06/16/2020   HGB 12.7 06/16/2020   HCT 38.0 06/16/2020   MCV 88.2 06/16/2020   PLT 266 06/16/2020    Recent Labs  Lab 06/16/20 0232  NA 138  K 4.2  CL 103  CO2 24  BUN 12  CREATININE 1.07*  CALCIUM 9.0  PROT 6.7  BILITOT 1.0  ALKPHOS 52  ALT 16  AST 32  GLUCOSE 202*     Radiology Studies    DG Chest 2 View  Result Date: 06/15/2020 CLINICAL DATA:  Chest pain EXAM: CHEST - 2 VIEW COMPARISON:  None. FINDINGS: Mild interstitial prominence. No pleural effusion or pneumothorax. Cardiomediastinal contours are within normal limits. Mild calcified plaque along the aortic arch. No acute osseous abnormality. IMPRESSION: Age-indeterminate mild interstitial prominence, which may reflect chronic changes or mild interstitial edema. Electronically Signed   By: Guadlupe Spanish M.D.   On: 06/15/2020 20:29    ECG & Cardiac Imaging    ECG: sinus rhythm, left posterior fascicular block, possible anteroseptal infarct, nonspecific ST-T wave abnormality - personally reviewed.  Assessment & Plan    Angela Frazier is a 74 year old woman with multiple cardiac risk factors presenting with somewhat atypical symptoms for ACS, however her troponin trend  is highly suggestive of an NSTEMI and she also has risk factors for atypical symptoms. ECG only shows non-specific changes. Her symptoms have currently resolved. She has no evidence of heart failure on exam. The patient's TIMI risk score is 4, which indicates a 20% risk of all cause mortality, new or recurrent myocardial infarction or need for urgent revascularization in the next 14 days.   NSTEMI: - Keep NPO for coronary angiography in the morning - Monitor on telemetry and continue to trend troponin every 4-6 hours - TTE, lipid panel, A1c, and repeat ECG in the morning - Continue aspirin 81mg  daily, received full dose with EMS - Continue heparin infusion - Change simvastatin to atorvastatin 80mg  - Continue carvedilol, losartan, and spironolactone at home doses - Sublingual nitro if recurrent chest pain - Would consider addition of GLP-1 agonist given suboptimal diabetic control and cardiovascular benefit if no contraindication.  Hypertension: Currently blood pressure range is acceptable for now. Continue home meds. Antihypertensive regimen is very well optimized for ACS.  Signed, , MD 06/16/2020, 4:24 AM  For questions or updates, please contact   Please consult [email or web address] for contact info under Cardiology/STEMI.

## 2020-06-16 NOTE — Interval H&P Note (Signed)
Cath Lab Visit (complete for each Cath Lab visit)  Clinical Evaluation Leading to the Procedure:   ACS: Yes.    Non-ACS:    Anginal Classification: CCS IV  Anti-ischemic medical therapy: Minimal Therapy (1 class of medications)  Non-Invasive Test Results: No non-invasive testing performed  Prior CABG: No previous CABG      History and Physical Interval Note:  06/16/2020 11:27 AM  Angela Frazier  has presented today for surgery, with the diagnosis of Nonstemi.  The various methods of treatment have been discussed with the patient and family. After consideration of risks, benefits and other options for treatment, the patient has consented to  Procedure(s): LEFT HEART CATH AND CORONARY ANGIOGRAPHY (N/A) as a surgical intervention.  The patient's history has been reviewed, patient examined, no change in status, stable for surgery.  I have reviewed the patient's chart and labs.  Questions were answered to the patient's satisfaction.     Lance Muss

## 2020-06-16 NOTE — Progress Notes (Signed)
ANTICOAGULATION CONSULT NOTE   Pharmacy Consult for Heparin Indication: chest pain/ACS  Allergies  Allergen Reactions  . Cefaclor   . Codeine   . Penicillins   . Sulfa Antibiotics   . Trazodone And Nefazodone   . Vibramycin [Doxycycline Calcium]     Patient Measurements: Height: 5\' 5"  (165.1 cm) Weight: 102.8 kg (226 lb 9.6 oz) IBW/kg (Calculated) : 57 Heparin Dosing Weight: 81.6kg  Vital Signs: Temp: 99 F (37.2 C) (11/24 0030) Temp Source: Oral (11/24 0030) BP: 148/87 (11/24 0030) Pulse Rate: 80 (11/24 0030)  Labs: Recent Labs    06/15/20 1921 06/15/20 1921 06/15/20 2134 06/16/20 0123 06/16/20 0232 06/16/20 0513  HGB 13.4   < >  --  12.8 12.7  --   HCT 40.6  --   --  39.8 38.0  --   PLT 272  --   --  272 266  --   APTT  --   --   --   --  56*  --   LABPROT  --   --   --   --  14.2  --   INR  --   --   --   --  1.1  --   HEPARINUNFRC  --   --   --   --   --  0.18*  CREATININE 1.08*  --   --   --  1.07*  --   TROPONINIHS 1,069*   < > 6,026* 9,657* 10,270*  --    < > = values in this interval not displayed.    Estimated Creatinine Clearance: 55.7 mL/min (A) (by C-G formula based on SCr of 1.07 mg/dL (H)).   Medical History: Past Medical History:  Diagnosis Date  . Arthritis   . Asthma   . Diabetes mellitus without complication (HCC)   . Hypercholesteremia   . Hypertension   . Hypothyroidism     Medications:  Infusions:  . sodium chloride     Followed by  . sodium chloride    . heparin 1,000 Units/hr (06/15/20 2136)    Assessment: 58 yof presented to the ED with CP and SOB. Troponin elevated and now starting IV heparin. Baseline CBC is WNL. She is not on anticoagulation PTA.   11/24 AM update:  Heparin level low No issues per RN  Goal of Therapy:  Heparin level 0.3-0.7 units/ml Monitor platelets by anticoagulation protocol: Yes   Plan:  Inc heparin to 1150 units/hr Check an 8 hr heparin level  12/24, PharmD, BCPS Clinical  Pharmacist Phone: [phone]

## 2020-06-16 NOTE — Progress Notes (Signed)
ANTICOAGULATION CONSULT NOTE   Pharmacy Consult for Heparin Indication: chest pain/ACS  Allergies  Allergen Reactions  . Cefaclor   . Codeine   . Penicillins   . Sulfa Antibiotics   . Trazodone And Nefazodone   . Vibramycin [Doxycycline Calcium]   . Erythromycin Rash    Patient Measurements: Height: 5\' 5"  (165.1 cm) Weight: 102.8 kg (226 lb 9.6 oz) IBW/kg (Calculated) : 57 Heparin Dosing Weight: 81.6kg  Vital Signs: Temp: 98.3 F (36.8 C) (11/24 0753) Temp Source: Oral (11/24 0753) BP: 126/62 (11/24 1201) Pulse Rate: 73 (11/24 1201)  Labs: Recent Labs    06/15/20 1921 06/15/20 2134 06/16/20 0123 06/16/20 0123 06/16/20 0232 06/16/20 0513 06/16/20 0736  HGB 13.4   < > 12.8  --  12.7  --   --   HCT 40.6  --  39.8  --  38.0  --   --   PLT 272  --  272  --  266  --   --   APTT  --   --   --   --  56*  --   --   LABPROT  --   --   --   --  14.2  --   --   INR  --   --   --   --  1.1  --   --   HEPARINUNFRC  --   --   --   --   --  0.18*  --   CREATININE 1.08*  --   --   --  1.07*  --   --   TROPONINIHS 1,069*   < > 9,657*   < > 10,270* 10,433* 9,038*   < > = values in this interval not displayed.    Estimated Creatinine Clearance: 55.7 mL/min (A) (by C-G formula based on SCr of 1.07 mg/dL (H)).   Medical History: Past Medical History:  Diagnosis Date  . Arthritis   . Asthma   . Diabetes mellitus without complication (HCC)   . Hypercholesteremia   . Hypertension   . Hypothyroidism     Medications:  Infusions:  . sodium chloride      Assessment: 74 yof presented to the ED with CP and SOB. She is now s/p cath with multivessel CAD and for CABG consult. Heparin to restart 8 hours post sheath removal (removed ~ 12pm)   Goal of Therapy:  Heparin level 0.3-0.7 units/ml Monitor platelets by anticoagulation protocol: Yes   Plan:  Restart heparin 1150 units/hr at 8pm Daily heparin level and CBC  06/18/20, PharmD Clinical Pharmacist **Pharmacist  phone directory can now be found on [email or web address] ([email or web address]).  Listed under [facility].

## 2020-06-16 NOTE — H&P (Signed)
History and Physical  Seri Kimmer LFY:[id number] DOB: 1945-07-26 DOA: 06/15/2020  Referring physician: Tilden Fossa, MD PCP: System, Provider Not In  Patient coming from: [facility]  Chief Complaint: Chest Pain  HPI: Angela Frazier is a 74 y.o. female with medical history significant for hypertension, hyperlipidemia,.CHHANDP OSA (untreated) hypothyroidism, asthma, type 2 diabetes mellitus who presents to [facility] ED due to chest pain.  Chest pain was located in the center of the chest and was described as tightness with radiation to the back of left arm and symptoms started clear after assisting a family member that fell.  Chest pain was associated with nausea, but denies vomiting, fever, chills, cough, abdominal pain.  Patient states that she already got COVID-19 booster vaccine [employer]) on 11/19.  ED Course:  In the emergency department, she was intermittently apneic, BP was 190/83 and other vital signs were within normal range.  Work-up in the ED showed leukocytosis, hyperglycemia, troponin 1069 > 6026 .  Chest x-ray showed age-indeterminate mild interstitial prominence, which may reflect chronic changes of mild interstitial edema.  EKG showed sinus rhythm at rate of 93 bpm with no acute changes.  ED physician consulted with cardiology (Dr. Okey Dupre) who recommends admitting patient for NSTEMI.  Patient was on IV heparin drip and transferred to [facility] for admission.  Review of Systems: Constitutional: Negative for chills and fever.  HENT: Negative for ear pain and sore throat.   Eyes: Negative for pain and visual disturbance.  Respiratory: Positive for chest tightness.  Negative for cough Cardiovascular: Positive for chest pain.  Negative for palpitations.  Gastrointestinal: Negative for abdominal pain and vomiting.  Endocrine: Negative for polyphagia and polyuria.  Genitourinary: Negative for decreased urine volume, dysuria, enuresis Musculoskeletal: Negative for arthralgias and back pain.  Skin:  Negative for color change and rash.  Allergic/Immunologic: Negative for immunocompromised state.  Neurological: Negative for tremors, syncope, speech difficulty, weakness, light-headedness and headaches.  Hematological: Does not bruise/bleed easily.  All other systems reviewed and are negative   Past Medical History:  Diagnosis Date  . Arthritis   . Asthma   . Diabetes mellitus without complication (HCC)   . Hypercholesteremia   . Hypertension   . Hypothyroidism    Past Surgical History:  Procedure Laterality Date  . THYROID SURGERY    . TUBAL LIGATION      Social History:  reports that she has never smoked. She has never used smokeless tobacco. She reports that she does not drink alcohol and does not use drugs.   Allergies  Allergen Reactions  . Cefaclor   . Codeine   . Penicillins   . Sulfa Antibiotics   . Trazodone And Nefazodone   . Vibramycin [Doxycycline Calcium]     History reviewed. No pertinent family history.   Prior to Admission medications   Medication Sig Start Date End Date Taking? Authorizing Provider  albuterol (PROVENTIL HFA;VENTOLIN HFA) 108 (90 Base) MCG/ACT inhaler Inhale 2 puffs into the lungs every 6 (six) hours as needed for wheezing or shortness of breath.    [provider]  aspirin 81 MG tablet Take 81 mg by mouth daily.    [provider]  carvedilol (COREG) 12.5 MG tablet Take 6.25 mg by mouth 2 (two) times daily with a meal.    [provider]  escitalopram (LEXAPRO) 10 MG tablet Take 10 mg by mouth daily.    [provider]  fluticasone (FLONASE) 50 MCG/ACT nasal spray Place 1 spray into both nostrils daily.  [provider]  gabapentin (NEURONTIN) 100 MG capsule Take 300 mg by mouth at bedtime.    [provider]  insulin NPH-regular Human (NOVOLIN 70/30) (70-30) 100 UNIT/ML injection Inject 30 Units into the skin daily with breakfast.    [provider]  levothyroxine  (SYNTHROID, LEVOTHROID) 150 MCG tablet Take 150 mcg by mouth daily before breakfast.    [provider]  losartan (COZAAR) 100 MG tablet Take 100 mg by mouth daily.    [provider]  magnesium oxide (MAG-OX) 400 MG tablet Take 400 mg by mouth daily.    [provider]  meloxicam (MOBIC) 7.5 MG tablet Take 7.5 mg by mouth daily.    [provider]  metFORMIN (GLUCOPHAGE) 500 MG tablet Take 1,000 mg by mouth 2 (two) times daily with a meal.    [provider]  simvastatin (ZOCOR) 20 MG tablet Take 20 mg by mouth daily.    [provider]  spironolactone (ALDACTONE) 25 MG tablet Take 25 mg by mouth daily.    [provider]  Vitamin D, Ergocalciferol, (DRISDOL) 50000 units CAPS capsule Take 50,000 Units by mouth every 7 (seven) days.    [provider]    Physical Exam: BP (!) 148/87 (BP Location: Left Arm)   Pulse 80   Temp 99 F (37.2 C) (Oral)   Resp (!) 21   Ht 5\' 5"  (1.651 m)   Wt 102.8 kg   SpO2 94%   BMI 37.71 kg/m   . General: 74 y.o. year-old female well developed well nourished in no acute distress.  Alert and oriented x3. 65 HEENT: NCAT, EOMI . Neck: Supple, trachea medial . Cardiovascular: Regular rate and rhythm with no rubs or gallops.  No thyromegaly or JVD noted.  No lower extremity edema. 2/4 pulses in all 4 extremities. Marland Kitchen Respiratory: Clear to auscultation with no wheezes or rales. Good inspiratory effort. . Abdomen: No nausea.  Soft nontender nondistended with normal bowel sounds x4 quadrants. . Muskuloskeletal: No cyanosis, clubbing or edema noted bilaterally . Neuro: CN II-XII intact, strength, sensation, reflexes . Skin: No ulcerative lesions noted or rashes . Psychiatry: Judgement and insight appear normal. Mood is appropriate for condition and setting          Labs on Admission:  Basic Metabolic Panel: Recent Labs  Lab 06/15/20 1921  NA 134*  K 3.8  CL 103  CO2 19*  GLUCOSE 353*   BUN 17  CREATININE 1.08*  CALCIUM 8.6*   Liver Function Tests: No results for input(s): AST, ALT, ALKPHOS, BILITOT, PROT, ALBUMIN in the last 168 hours. No results for input(s): LIPASE, AMYLASE in the last 168 hours. No results for input(s): AMMONIA in the last 168 hours. CBC: Recent Labs  Lab 06/15/20 1921 06/16/20 0123 06/16/20 0232  WBC 13.0* 12.8* 11.6*  HGB 13.4 12.8 12.7  HCT 40.6 39.8 38.0  MCV 88.1 86.9 88.2  PLT 272 272 266   Cardiac Enzymes: No results for input(s): CKTOTAL, CKMB, CKMBINDEX, TROPONINI in the last 168 hours.  BNP (last 3 results) No results for input(s): BNP in the last 8760 hours.  ProBNP (last 3 results) No results for input(s): PROBNP in the last 8760 hours.  CBG: Recent Labs  Lab 06/16/20 0126  GLUCAP 190*    Radiological Exams on Admission: DG Chest 2 View  Result Date: 06/15/2020 CLINICAL DATA:  Chest pain EXAM: CHEST - 2 VIEW COMPARISON:  None. FINDINGS: Mild interstitial prominence. No pleural effusion or  pneumothorax. Cardiomediastinal contours are within normal limits. Mild calcified plaque along the aortic arch. No acute osseous abnormality. IMPRESSION: Age-indeterminate mild interstitial prominence, which may reflect chronic changes or mild interstitial edema. Electronically Signed   By: Guadlupe Spanish M.D.   On: 06/15/2020 20:29    EKG: I independently viewed the EKG done and my findings are as followed: Sinus rhythm at rate of 93 bpm with prolonged QTc(543ms)  Assessment/Plan Present on Admission: . NSTEMI (non-ST elevated myocardial infarction) (HCC)  Principal Problem:   NSTEMI (non-ST elevated myocardial infarction) (HCC) Active Problems:   Chest pain   Nausea   Obesity (BMI 30-39.9)   Essential hypertension   Hyperlipidemia   Hypothyroidism   Asthma, chronic, unspecified asthma severity, with acute exacerbation   Hyperglycemia due to diabetes mellitus (HCC)   Leukocytosis   Chest pain secondary to  NSTEMI Troponin 1069 > 6026 > 9657; continue to trend troponin EKG showed normal sinus rhythm at rate of 93 bpm Continue IV heparin drip Aspirin 324 mg oral ordered pending Continue Coreg, Cozaar and Zocor Cardiology already consulted by [facility] ED, we shall await further recommendation  Prolonged QTc Avoid QT prolonging drugs Magnesium level will be checked  Nausea Continue IV Compazine 10 mg every 6 hours as needed  Hyperglycemia secondary to poorly controlled type 2 diabetes mellitus Continue insulin sliding scale and hypoglycemia protocol  Leukocytosis possibly reactive WBC 13.0, no sign of any acute infectious process at this time Continue to monitor WBC with morning labs  Essential hypertension Continue losartan and Coreg  Hyperlipidemia Continue Zocor  Hypothyroidism Continue Synthroid  Asthma Continue Ventolin  Obesity (BMI 37.71) Patient will be counseled diet and life modification when more stable.  DVT prophylaxis: Heparin drip  Code Status: Full code  Family Communication: None at bedside  Disposition Plan:  Patient is from:                        home Anticipated DC to:                   SNF or family members home Anticipated DC date:               2-3 days Anticipated DC barriers:           Patient is unstable to be discharged at this time due to NSTEMI which possibly require cardiac catheterization and pending cardiology intervention and recommendation   Consults called: Cardiology  Admission status: Inpatient    Frankey Shown MD [facility]  06/16/2020, 3:42 AM

## 2020-06-16 NOTE — Care Management (Signed)
1232 06/16/20 Benefits check submitted for farxiga and jardiance 10mg /d. Case Manager will follow for cost. Graves-Bigelow, , RN,BSN Case Manager

## 2020-06-16 NOTE — Progress Notes (Signed)
Echocardiogram 2D Echocardiogram has been performed.  Angela Frazier 06/16/2020, 2:58 PM

## 2020-06-16 NOTE — H&P (View-Only) (Signed)
    Patient seen in consultation this morning by overnight MD. Admitted with NSTEMI [place] to [address] this morning. Remains on IV heparin. Remains chest pain free. Plan for cardiac cath today.  -- The patient understands that risks included but are not limited to stroke (1 in 1000), death (1 in 1000), kidney failure [usually temporary] (1 in 500), bleeding (1 in 200), allergic reaction [possibly serious] (1 in 200).  -- continue ASA, statin, BB. Will ask RN to hold morning dose of losartan with plans for cath -- echo pending  General: Well developed, well nourished, female appearing in no acute distress. Head: Normocephalic, atraumatic.  Neck: Supple without bruits, JVD. Lungs:  Resp regular and unlabored, CTA. Heart: RRR, S1, S2, no S3, S4, or murmur; no rub. Abdomen: Soft, non-tender, non-distended with normoactive bowel sounds. No hepatomegaly. No rebound/guarding. No obvious abdominal masses. Extremities: No clubbing, cyanosis, edema. Distal pedal pulses are 2+ bilaterally. Neuro: Alert and oriented X 3. Moves all extremities spontaneously. Psych: Normal affect.   Signed, Lindsay Roberts, NP-C 06/16/2020, 7:54 AM Pager: [phone]   ATTENDING ATTESTATION  I have seen, examined and evaluated the patient this AM along with  Lindsay Roberts, NP-C.  After reviewing all the available data and chart, we discussed the patients laboratory, study & physical findings as well as symptoms in detail. I agree with her findings, examination as well as impression recommendations as per our discussion.     I reviewed the H&P.  Relatively unusual presentation, troponin levels are consistent with non-STEMI.  I agree that best course of action is to proceed with cardiac catheterization.  She has relatively significant family history with a brother having CABG and she is morbidly obese with hypertension hyperlipidemia and diabetes mellitus-2.  -> She is on carvedilol, losartan and spironolactone at  home, we have converted from simvastatin to high-dose atorvastatin. Continue IV heparin until cath.  We will defer to TRH for management of diabetes but agree that GLP-1 agonist would be beneficial given her obesity and suboptimal diabetic control.   Patient is in agreement.    Ily Denno, M.D., M.S. Interventional Cardiologist   Pager # [phone] Phone # [phone] [address] [place],  [address]   

## 2020-06-16 NOTE — TOC Benefit Eligibility Note (Signed)
Transition of Care [facility]) Benefit Eligibility Note    Patient Details  Name: Lorelie Biermann MRN: [id number] Date of Birth: [birth year]   Medication/Dose: Wilder Glade 65m. daily and or Jardiance 146mdaily  Covered?: Yes  Tier: 3 Drug  Prescription Coverage Preferred Pharmacy: any in-network pharmacy  Spoke with Person/Company/Phone Number:: CaGerri LinsH# [phone]-Pay: FaWilder Glade140.00 and Jardiance  $144.12  Prior Approval: No  Deductible: Met       HaShelda Alteshone Number: 06/16/2020, 1:54 PM

## 2020-06-16 NOTE — Progress Notes (Signed)
TCTS consulted for CABG evaluation. °

## 2020-06-16 NOTE — Plan of Care (Signed)
  Problem: Education: Goal: Knowledge of General Education information will improve Description: Including pain rating scale, medication(s)/side effects and non-pharmacologic comfort measures Outcome: Progressing   Problem: Nutrition: Goal: Adequate nutrition will be maintained Outcome: Progressing   Problem: Coping: Goal: Level of anxiety will decrease Outcome: Progressing   

## 2020-06-17 ENCOUNTER — Inpatient Hospital Stay (HOSPITAL_COMMUNITY): Payer: Medicare HMO

## 2020-06-17 DIAGNOSIS — E782 Mixed hyperlipidemia: Secondary | ICD-10-CM | POA: Diagnosis not present

## 2020-06-17 DIAGNOSIS — I214 Non-ST elevation (NSTEMI) myocardial infarction: Secondary | ICD-10-CM | POA: Diagnosis not present

## 2020-06-17 DIAGNOSIS — E669 Obesity, unspecified: Secondary | ICD-10-CM | POA: Diagnosis not present

## 2020-06-17 DIAGNOSIS — I25110 Atherosclerotic heart disease of native coronary artery with unstable angina pectoris: Secondary | ICD-10-CM

## 2020-06-17 DIAGNOSIS — E1165 Type 2 diabetes mellitus with hyperglycemia: Secondary | ICD-10-CM | POA: Diagnosis not present

## 2020-06-17 DIAGNOSIS — Z01810 Encounter for preprocedural cardiovascular examination: Secondary | ICD-10-CM | POA: Diagnosis not present

## 2020-06-17 DIAGNOSIS — I4891 Unspecified atrial fibrillation: Secondary | ICD-10-CM

## 2020-06-17 HISTORY — DX: Unspecified atrial fibrillation: I48.91

## 2020-06-17 LAB — BLOOD GAS, ARTERIAL
Acid-base deficit: 2.8 mmol/L — ABNORMAL HIGH (ref 0.0–2.0)
Bicarbonate: 20.6 mmol/L (ref 20.0–28.0)
Drawn by: 358491
FIO2: 21
O2 Saturation: 93.1 %
Patient temperature: 37
pCO2 arterial: 30.1 mmHg — ABNORMAL LOW (ref 32.0–48.0)
pH, Arterial: 7.45 (ref 7.350–7.450)
pO2, Arterial: 68.1 mmHg — ABNORMAL LOW (ref 83.0–108.0)

## 2020-06-17 LAB — CBC
HCT: 40 % (ref 36.0–46.0)
Hemoglobin: 12.7 g/dL (ref 12.0–15.0)
MCH: 28.7 pg (ref 26.0–34.0)
MCHC: 31.8 g/dL (ref 30.0–36.0)
MCV: 90.5 fL (ref 80.0–100.0)
Platelets: 275 10*3/uL (ref 150–400)
RBC: 4.42 MIL/uL (ref 3.87–5.11)
RDW: 14 % (ref 11.5–15.5)
WBC: 13.4 10*3/uL — ABNORMAL HIGH (ref 4.0–10.5)
nRBC: 0 % (ref 0.0–0.2)

## 2020-06-17 LAB — URINALYSIS, ROUTINE W REFLEX MICROSCOPIC
Bilirubin Urine: NEGATIVE
Glucose, UA: NEGATIVE mg/dL
Ketones, ur: NEGATIVE mg/dL
Nitrite: NEGATIVE
Protein, ur: NEGATIVE mg/dL
Specific Gravity, Urine: 1.01 (ref 1.005–1.030)
pH: 5 (ref 5.0–8.0)

## 2020-06-17 LAB — GLUCOSE, CAPILLARY
Glucose-Capillary: 159 mg/dL — ABNORMAL HIGH (ref 70–99)
Glucose-Capillary: 163 mg/dL — ABNORMAL HIGH (ref 70–99)
Glucose-Capillary: 238 mg/dL — ABNORMAL HIGH (ref 70–99)
Glucose-Capillary: 281 mg/dL — ABNORMAL HIGH (ref 70–99)
Glucose-Capillary: 310 mg/dL — ABNORMAL HIGH (ref 70–99)

## 2020-06-17 LAB — HEPARIN LEVEL (UNFRACTIONATED)
Heparin Unfractionated: 0.2 IU/mL — ABNORMAL LOW (ref 0.30–0.70)
Heparin Unfractionated: 0.33 IU/mL (ref 0.30–0.70)
Heparin Unfractionated: 0.44 IU/mL (ref 0.30–0.70)

## 2020-06-17 LAB — HEMOGLOBIN A1C
Hgb A1c MFr Bld: 9.1 % — ABNORMAL HIGH (ref 4.8–5.6)
Mean Plasma Glucose: 214.47 mg/dL

## 2020-06-17 LAB — SURGICAL PCR SCREEN
MRSA, PCR: NEGATIVE
Staphylococcus aureus: POSITIVE — AB

## 2020-06-17 LAB — PROTIME-INR
INR: 1.1 (ref 0.8–1.2)
Prothrombin Time: 14.1 seconds (ref 11.4–15.2)

## 2020-06-17 LAB — BASIC METABOLIC PANEL
Anion gap: 13 (ref 5–15)
BUN: 14 mg/dL (ref 8–23)
CO2: 19 mmol/L — ABNORMAL LOW (ref 22–32)
Calcium: 9.1 mg/dL (ref 8.9–10.3)
Chloride: 103 mmol/L (ref 98–111)
Creatinine, Ser: 1.1 mg/dL — ABNORMAL HIGH (ref 0.44–1.00)
GFR, Estimated: 53 mL/min — ABNORMAL LOW (ref >60)
Glucose, Bld: 251 mg/dL — ABNORMAL HIGH (ref 70–99)
Potassium: 4 mmol/L (ref 3.5–5.1)
Sodium: 135 mmol/L (ref 135–145)

## 2020-06-17 LAB — MAGNESIUM: Magnesium: 1.4 mg/dL — ABNORMAL LOW (ref 1.7–2.4)

## 2020-06-17 LAB — APTT: aPTT: 83 seconds — ABNORMAL HIGH (ref 24–36)

## 2020-06-17 MED ORDER — NOREPINEPHRINE 4 MG/250ML-% IV SOLN
0.0000 ug/min | INTRAVENOUS | Status: DC
  Filled 2020-06-17: qty 250

## 2020-06-17 MED ORDER — MILRINONE LACTATE IN DEXTROSE 20-5 MG/100ML-% IV SOLN
0.3000 ug/kg/min | INTRAVENOUS | Status: AC
  Administered 2020-06-18: .375 ug/kg/min via INTRAVENOUS
  Filled 2020-06-17: qty 100

## 2020-06-17 MED ORDER — CHLORHEXIDINE GLUCONATE CLOTH 2 % EX PADS
6.0000 | MEDICATED_PAD | Freq: Once | CUTANEOUS | Status: AC
  Administered 2020-06-17: 6 via TOPICAL

## 2020-06-17 MED ORDER — POTASSIUM CHLORIDE 2 MEQ/ML IV SOLN
80.0000 meq | INTRAVENOUS | Status: DC
  Filled 2020-06-17: qty 40

## 2020-06-17 MED ORDER — TRANEXAMIC ACID (OHS) PUMP PRIME SOLUTION
2.0000 mg/kg | INTRAVENOUS | Status: DC
  Filled 2020-06-17: qty 2.06

## 2020-06-17 MED ORDER — CHLORHEXIDINE GLUCONATE CLOTH 2 % EX PADS
6.0000 | MEDICATED_PAD | Freq: Once | CUTANEOUS | Status: AC
  Administered 2020-06-18: 6 via TOPICAL

## 2020-06-17 MED ORDER — TRANEXAMIC ACID (OHS) BOLUS VIA INFUSION
15.0000 mg/kg | INTRAVENOUS | Status: AC
  Administered 2020-06-18: 1543.5 mg via INTRAVENOUS
  Filled 2020-06-17: qty 1544

## 2020-06-17 MED ORDER — MAGNESIUM SULFATE 4 GM/100ML IV SOLN
4.0000 g | Freq: Once | INTRAVENOUS | Status: AC
  Administered 2020-06-17: 4 g via INTRAVENOUS
  Filled 2020-06-17: qty 100

## 2020-06-17 MED ORDER — EPINEPHRINE HCL 5 MG/250ML IV SOLN IN NS
0.0000 ug/min | INTRAVENOUS | Status: AC
  Administered 2020-06-18: 13:00:00 1 ug/min via INTRAVENOUS
  Filled 2020-06-17: qty 250

## 2020-06-17 MED ORDER — INSULIN REGULAR(HUMAN) IN NACL 100-0.9 UT/100ML-% IV SOLN
INTRAVENOUS | Status: AC
  Administered 2020-06-18: 1 [IU]/h via INTRAVENOUS
  Filled 2020-06-17: qty 100

## 2020-06-17 MED ORDER — CHLORHEXIDINE GLUCONATE 0.12 % MT SOLN
15.0000 mL | Freq: Once | OROMUCOSAL | Status: AC
  Administered 2020-06-18: 15 mL via OROMUCOSAL
  Filled 2020-06-17: qty 15

## 2020-06-17 MED ORDER — MAGNESIUM SULFATE 50 % IJ SOLN
40.0000 meq | INTRAMUSCULAR | Status: DC
  Filled 2020-06-17: qty 9.85

## 2020-06-17 MED ORDER — METOPROLOL TARTRATE 12.5 MG HALF TABLET
12.5000 mg | ORAL_TABLET | Freq: Once | ORAL | Status: AC
  Administered 2020-06-18: 12.5 mg via ORAL
  Filled 2020-06-17: qty 1

## 2020-06-17 MED ORDER — SODIUM CHLORIDE 0.9 % IV SOLN
INTRAVENOUS | Status: DC
  Filled 2020-06-17: qty 30

## 2020-06-17 MED ORDER — PLASMA-LYTE 148 IV SOLN
INTRAVENOUS | Status: DC
  Filled 2020-06-17: qty 2.5

## 2020-06-17 MED ORDER — MUPIROCIN 2 % EX OINT
1.0000 "application " | TOPICAL_OINTMENT | Freq: Two times a day (BID) | CUTANEOUS | Status: DC
  Administered 2020-06-17 (×2): 1 via NASAL
  Filled 2020-06-17: qty 22

## 2020-06-17 MED ORDER — INSULIN GLARGINE 100 UNIT/ML ~~LOC~~ SOLN
10.0000 [IU] | Freq: Every day | SUBCUTANEOUS | Status: DC
  Administered 2020-06-17: 10 [IU] via SUBCUTANEOUS
  Filled 2020-06-17 (×3): qty 0.1

## 2020-06-17 MED ORDER — BISACODYL 5 MG PO TBEC
5.0000 mg | DELAYED_RELEASE_TABLET | Freq: Once | ORAL | Status: AC
  Administered 2020-06-17: 5 mg via ORAL
  Filled 2020-06-17: qty 1

## 2020-06-17 MED ORDER — NITROGLYCERIN IN D5W 200-5 MCG/ML-% IV SOLN
2.0000 ug/min | INTRAVENOUS | Status: AC
  Administered 2020-06-18: 5 ug/min via INTRAVENOUS
  Filled 2020-06-17: qty 250

## 2020-06-17 MED ORDER — CHLORHEXIDINE GLUCONATE CLOTH 2 % EX PADS
6.0000 | MEDICATED_PAD | Freq: Every day | CUTANEOUS | Status: DC
  Administered 2020-06-17: 6 via TOPICAL

## 2020-06-17 MED ORDER — TRANEXAMIC ACID 1000 MG/10ML IV SOLN
1.5000 mg/kg/h | INTRAVENOUS | Status: AC
  Administered 2020-06-18: 1.5 mg/kg/h via INTRAVENOUS
  Filled 2020-06-17: qty 25

## 2020-06-17 MED ORDER — LEVOFLOXACIN IN D5W 500 MG/100ML IV SOLN
500.0000 mg | INTRAVENOUS | Status: AC
  Administered 2020-06-18: 500 mg via INTRAVENOUS
  Filled 2020-06-17: qty 100

## 2020-06-17 MED ORDER — PHENYLEPHRINE HCL-NACL 20-0.9 MG/250ML-% IV SOLN
30.0000 ug/min | INTRAVENOUS | Status: AC
  Administered 2020-06-18: 40 ug/min via INTRAVENOUS
  Administered 2020-06-18: 20 ug/min via INTRAVENOUS
  Filled 2020-06-17: qty 250

## 2020-06-17 MED ORDER — TEMAZEPAM 15 MG PO CAPS
15.0000 mg | ORAL_CAPSULE | Freq: Once | ORAL | Status: AC | PRN
  Administered 2020-06-17: 15 mg via ORAL
  Filled 2020-06-17: qty 1

## 2020-06-17 MED ORDER — DEXMEDETOMIDINE HCL IN NACL 400 MCG/100ML IV SOLN
0.1000 ug/kg/h | INTRAVENOUS | Status: AC
  Administered 2020-06-18: .7 ug/kg/h via INTRAVENOUS
  Filled 2020-06-17: qty 100

## 2020-06-17 MED ORDER — VANCOMYCIN HCL 1500 MG/300ML IV SOLN
1500.0000 mg | INTRAVENOUS | Status: AC
  Administered 2020-06-18: 1500 mg via INTRAVENOUS
  Filled 2020-06-17: qty 300

## 2020-06-17 NOTE — Progress Notes (Signed)
Progress Note  Patient Name: Angela Frazier Date of Encounter: 06/17/2020  [facility] Cardiologist: No primary care provider on file.   Subjective   No chest pain  Inpatient Medications    Scheduled Meds: . aspirin EC  81 mg Oral Daily  . atorvastatin  80 mg Oral Daily  . carvedilol  6.25 mg Oral BID WC  . Chlorhexidine Gluconate Cloth  6 each Topical Daily  . [START ON 06/18/2020] epinephrine  0-10 mcg/min Intravenous To OR  . fluticasone  2 spray Each Nare Daily  . fluticasone furoate-vilanterol  1 puff Inhalation Daily  . gabapentin  300 mg Oral QHS  . [START ON 06/18/2020] heparin-papaverine-plasmalyte irrigation   Irrigation To OR  . insulin aspart  0-15 Units Subcutaneous Q4H  . insulin glargine  5 Units Subcutaneous QHS  . [START ON 06/18/2020] insulin   Intravenous To OR  . levothyroxine  137 mcg Oral QAC breakfast  . losartan  100 mg Oral Daily  . [START ON 06/18/2020] magnesium sulfate  40 mEq Other To OR  . montelukast  10 mg Oral QHS  . mupirocin ointment  1 application Nasal BID  . [START ON 06/18/2020] phenylephrine  30-200 mcg/min Intravenous To OR  . [START ON 06/18/2020] potassium chloride  80 mEq Other To OR  . sodium chloride flush  3 mL Intravenous Q12H  . spironolactone  25 mg Oral Daily  . [START ON 06/18/2020] tranexamic acid  15 mg/kg Intravenous To OR  . [START ON 06/18/2020] tranexamic acid  2 mg/kg Intracatheter To OR   Continuous Infusions: . sodium chloride    . [START ON 06/18/2020] dexmedetomidine    . [START ON 06/18/2020] heparin 30,000 units/NS 1000 mL solution for CELLSAVER    . heparin 1,300 Units/hr (06/17/20 0229)  . [START ON 06/18/2020] levofloxacin (LEVAQUIN) IV    . magnesium sulfate bolus IVPB 4 g (06/17/20 0931)  . [START ON 06/18/2020] milrinone    . [START ON 06/18/2020] nitroGLYCERIN    . [START ON 06/18/2020] norepinephrine    . [START ON 06/18/2020] tranexamic acid (CYKLOKAPRON) infusion ([facility])    . [START ON  06/18/2020] vancomycin     PRN Meds: sodium chloride, acetaminophen, ipratropium-albuterol, LORazepam, prochlorperazine, sodium chloride flush   Vital Signs    Vitals:   06/17/20 0715 06/17/20 0751 06/17/20 0754 06/17/20 0918  BP: (!) 119/47 (!) 115/57  134/64  Pulse: 61 62  72  Resp: (!) 26 (!) 22    Temp:  98.1 F (36.7 C)    TempSrc:  Oral    SpO2: 97% 96% 97%   Weight:      Height:        Intake/Output Summary (Last 24 hours) at 06/17/2020 0935 Last data filed at 06/16/2020 2300 Gross per 24 hour  Intake 253.52 ml  Output --  Net 253.52 ml   Last 3 Weights 06/17/2020 06/16/2020 06/15/2020  Weight (lbs) 226 lb 12.8 oz 226 lb 9.6 oz 233 lb  Weight (kg) 102.876 kg 102.785 kg 105.688 kg      Telemetry    NSR - Personally Reviewed  ECG    NSR on 11/24- Personally Reviewed  Physical Exam   GEN: No acute distress.   Neck: No JVD Cardiac: RRR, no murmurs, rubs, or gallops.  Respiratory: Clear to auscultation bilaterally. GI: Soft, nontender, non-distended  MS: No edema; No deformity. No right wrist hematoma Neuro:  Nonfocal  Psych: Normal affect   Labs    High Sensitivity Troponin:  Recent Labs  Lab 06/15/20 2134 06/16/20 0123 06/16/20 0232 06/16/20 0513 06/16/20 0736  TROPONINIHS 6,026* 9,657* 10,270* 10,433* 9,038*      Chemistry Recent Labs  Lab 06/15/20 1921 06/16/20 0232 06/17/20 0031  NA 134* 138 135  K 3.8 4.2 4.0  CL 103 103 103  CO2 19* 24 19*  GLUCOSE 353* 202* 251*  BUN 17 12 14   CREATININE 1.08* 1.07* 1.10*  CALCIUM 8.6* 9.0 9.1  PROT  --  6.7  --   ALBUMIN  --  3.3*  --   AST  --  32  --   ALT  --  16  --   ALKPHOS  --  52  --   BILITOT  --  1.0  --   GFRNONAA 54* 55* 53*  ANIONGAP 12 11 13      Hematology Recent Labs  Lab 06/16/20 0123 06/16/20 0232 06/17/20 0031  WBC 12.8* 11.6* 13.4*  RBC 4.58 4.31 4.42  HGB 12.8 12.7 12.7  HCT 39.8 38.0 40.0  MCV 86.9 88.2 90.5  MCH 27.9 29.5 28.7  MCHC 32.2 33.4 31.8   RDW 14.0 13.8 14.0  PLT 272 266 275    BNPNo results for input(s): BNP, PROBNP in the last 168 hours.   DDimer No results for input(s): DDIMER in the last 168 hours.   Radiology    DG Chest 2 View  Result Date: 06/15/2020 CLINICAL DATA:  Chest pain EXAM: CHEST - 2 VIEW COMPARISON:  None. FINDINGS: Mild interstitial prominence. No pleural effusion or pneumothorax. Cardiomediastinal contours are within normal limits. Mild calcified plaque along the aortic arch. No acute osseous abnormality. IMPRESSION: Age-indeterminate mild interstitial prominence, which may reflect chronic changes or mild interstitial edema. Electronically Signed   By: 06/19/20 M.D.   On: 06/15/2020 20:29   CARDIAC CATHETERIZATION  Result Date: 06/16/2020  Dist RCA lesion is 100% stenosed. Left to right collaterals.  Mid RCA lesion is 50% stenosed.  Prox Cx lesion is 80% stenosed.  Mid Cx lesion is 80% stenosed.  1st Mrg lesion is 75% stenosed. This is a small vessel and may not be a target for CABG. The OM2 is a large vessel that is likely a better bypass target.  Ost LAD to Prox LAD lesion is 90% stenosed.  There is moderate left ventricular systolic dysfunction.  LV end diastolic pressure is moderately elevated.  The left ventricular ejection fraction is 35-45% by visual estimate.  There is no aortic valve stenosis.  RPDA lesion is 80% stenosed.  RPAV lesion is 80% stenosed.  Severe three vessel disease. Will obtain CVTS consult for CABG.  Medical therapy for LV dysfunction.  Likely will need grafts to LAD, OM2, PDA, PLA   ECHOCARDIOGRAM COMPLETE  Result Date: 06/16/2020    ECHOCARDIOGRAM REPORT   Patient Name:   Angela Frazier Date of Exam: 06/16/2020 Medical Rec #:  [name]    Height:       65.0 in Accession #:    06/18/2020   Weight:       226.6 lb Date of Birth:  1946/05/29     BSA:          2.086 m Patient Age:    74 years     BP:           129/65 mmHg Patient Gender: F            HR:           69  bpm. Exam Location:  Inpatient Procedure: 2D Echo, 3D Echo, Color Doppler, Cardiac Doppler and Intracardiac            Opacification Agent Indications:    Acute Coronary Syndrome i24.9  History:        Patient has no prior history of Echocardiogram examinations.                 NSTEMI; Risk Factors:Hypertension, Diabetes and Dyslipidemia.  Sonographer:    Irving Burton Senior RDCS Referring Phys: [phone] IMPRESSIONS  1. Left ventricular ejection fraction, by estimation, is 35 to 40%. The left ventricle has moderately decreased function. The left ventricle demonstrates regional wall motion abnormalities (see scoring diagram/findings for description). There is mild concentric left ventricular hypertrophy. Left ventricular diastolic parameters are consistent with Grade II diastolic dysfunction (pseudonormalization). Elevated left ventricular end-diastolic pressure.  2. Right ventricular systolic function is normal. The right ventricular size is normal. There is mildly elevated pulmonary artery systolic pressure.  3. Left atrial size was mildly dilated.  4. Right atrial size was mildly dilated.  5. The mitral valve is normal in structure. Trivial mitral valve regurgitation. No evidence of mitral stenosis.  6. The aortic valve is grossly normal. There is mild calcification of the aortic valve. Aortic valve regurgitation is not visualized. No aortic stenosis is present.  7. The inferior vena cava is dilated in size with <50% respiratory variability, suggesting right atrial pressure of 15 mmHg. Conclusion(s)/Recommendation(s): Reduced LVEF, focal wall motion abnormalities as noted. No LV thrombus seen with contrast. FINDINGS  Left Ventricle: There is an area of concern for thrombus at the apex, but it does not fully exclude contrast with definity. Therefore, not consistent with LV thrombus. Left ventricular ejection fraction, by estimation, is 35 to 40%. The left ventricle has moderately decreased function. The left  ventricle demonstrates regional wall motion abnormalities. The left ventricular internal cavity size was normal in size. There is mild concentric left ventricular hypertrophy. Left ventricular diastolic parameters are consistent with Grade II diastolic dysfunction (pseudonormalization). Elevated left ventricular end-diastolic pressure.  LV Wall Scoring: The mid and distal anterior wall and apex are akinetic. The mid and distal lateral wall, mid and distal anterior septum, entire inferior wall, mid anterolateral segment, mid inferoseptal segment, and basal anterior segment are hypokinetic. The basal anteroseptal segment, basal inferolateral segment, basal anterolateral segment, and basal inferoseptal segment are normal. Right Ventricle: The right ventricular size is normal. No increase in right ventricular wall thickness. Right ventricular systolic function is normal. There is mildly elevated pulmonary artery systolic pressure. The tricuspid regurgitant velocity is 2.58  m/s, and with an assumed right atrial pressure of 15 mmHg, the estimated right ventricular systolic pressure is 41.6 mmHg. Left Atrium: Left atrial size was mildly dilated. Right Atrium: Right atrial size was mildly dilated. Pericardium: Trivial pericardial effusion is present. Presence of pericardial fat pad. Mitral Valve: The mitral valve is normal in structure. Trivial mitral valve regurgitation. No evidence of mitral valve stenosis. Tricuspid Valve: The tricuspid valve is normal in structure. Tricuspid valve regurgitation is trivial. No evidence of tricuspid stenosis. Aortic Valve: The aortic valve is grossly normal. There is mild calcification of the aortic valve. Aortic valve regurgitation is not visualized. No aortic stenosis is present. Pulmonic Valve: The pulmonic valve was not well visualized. Pulmonic valve regurgitation is trivial. No evidence of pulmonic stenosis. Aorta: The aortic root and ascending aorta are structurally normal, with  no evidence of dilitation. Venous: The inferior vena cava is dilated in size  with less than 50% respiratory variability, suggesting right atrial pressure of 15 mmHg. IAS/Shunts: The atrial septum is grossly normal.  LEFT VENTRICLE PLAX 2D LVIDd:         4.70 cm      Diastology LVIDs:         3.10 cm      LV e' medial:    3.81 cm/s LV PW:         1.20 cm      LV E/e' medial:  31.2 LV IVS:        1.30 cm      LV e' lateral:   3.59 cm/s LVOT diam:     2.10 cm      LV E/e' lateral: 33.1 LV SV:         60 LV SV Index:   29 LVOT Area:     3.46 cm  LV Volumes (MOD) LV vol d, MOD A2C: 105.0 ml LV vol d, MOD A4C: 96.6 ml LV vol s, MOD A2C: 64.1 ml LV vol s, MOD A4C: 50.8 ml LV SV MOD A2C:     40.9 ml LV SV MOD A4C:     96.6 ml LV SV MOD BP:      43.2 ml RIGHT VENTRICLE RV S prime:     8.59 cm/s TAPSE (M-mode): 2.3 cm LEFT ATRIUM             Index       RIGHT ATRIUM           Index LA diam:        4.10 cm 1.97 cm/m  RA Area:     17.40 cm LA Vol (A2C):   52.1 ml 24.98 ml/m RA Volume:   49.40 ml  23.68 ml/m LA Vol (A4C):   51.6 ml 24.74 ml/m LA Biplane Vol: 55.7 ml 26.70 ml/m  AORTIC VALVE LVOT Vmax:   75.70 cm/s LVOT Vmean:  58.300 cm/s LVOT VTI:    0.172 m  AORTA Ao Root diam: 2.80 cm Ao Asc diam:  3.50 cm MITRAL VALVE                TRICUSPID VALVE MV Area (PHT): 3.31 cm     TR Peak grad:   26.6 mmHg MV Decel Time: 229 msec     TR Vmax:        258.00 cm/s MV E velocity: 119.00 cm/s MV A velocity: 111.00 cm/s  SHUNTS MV E/A ratio:  1.07         Systemic VTI:  0.17 m                             Systemic Diam: 2.10 cm Jodelle RedBridgette Christopher MD Electronically signed by Jodelle RedBridgette Christopher MD Signature Date/Time: 06/16/2020/7:15:49 PM    Final     Cardiac Studies   Cath films seen  Patient Profile     74 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  Assessment & Plan    CAD/NSTEMI: CABG tomorrow.  Will need aspirin and Plavix post op for NSTEMI.  DM: Continue insulin.  Will need weight loss long term.  Whole food, high  fiber, plant based diet. LDL 82 at presentation.  No on high dose atorvastatin.  COntinue to monitor.    LV dysfunction: Should improve with revascularization.      For questions or updates, please contact [facility] Please consult [email or web address] for contact info under  Signed, Lance Muss, MD  06/17/2020, 9:35 AM

## 2020-06-17 NOTE — Progress Notes (Signed)
   06/17/20 0413  Assess: MEWS Score  Temp 98.7 F (37.1 C)  BP 116/60  Pulse Rate 66  ECG Heart Rate 66  Resp (!) 26  SpO2 95 %  Assess: MEWS Score  MEWS Temp 0  MEWS Systolic 0  MEWS Pulse 0  MEWS RR 2  MEWS LOC 0  MEWS Score 2  MEWS Score Color Yellow  Assess: if the MEWS score is Yellow or Red  Were vital signs taken at a resting state? Yes  Focused Assessment No change from prior assessment  Early Detection of Sepsis Score *See Row Information* Low  MEWS guidelines implemented *See Row Information* Yes  Treat  MEWS Interventions Escalated (See documentation below)  Notify: Charge Nurse/RN  Name of Charge Nurse/RN Notified Monica RN  Date Charge Nurse/RN Notified 06/17/20  Time Charge Nurse/RN Notified [phone]

## 2020-06-17 NOTE — Progress Notes (Addendum)
PROGRESS NOTE    Angela Frazier  ZOX:[id number] DOB: [birth year] DOA: 06/15/2020 PCP: System, Provider Not In    Brief Narrative:  Angela Frazier is a 74 year old female with past medical history notable for essential hypertension, hyperlipidemia, OSA, hypothyroidism, asthma, type 2 diabetes mellitus who presented to the [facility] ED with complaint of chest pain.  Chest pain localized to the center of chest described as a tightness with radiation to the back of the left arm.  Chest pain worsened after assisting family member that fell.  Associated with nausea.  No vomiting, no fever/chills, no cough, no abdominal pain.  In the ED, BP 190/83, glucose 353.  Troponin [id number].  Chest x-ray showed age-indeterminate mild interstitial prominence reflective of chronic changes versus mild interstitial edema.  EKG with NSR, rate 93 with no acute changes.  ED physician consulted cardiology, Dr. Okey Dupre who recommend admitting patient for NSTEMI.  Patient started on IV heparin drip and transferred to [facility] under hospital service for further evaluation and management.   Assessment & Plan:   Principal Problem:   NSTEMI (non-ST elevated myocardial infarction) (HCC) Active Problems:   Chest pain   Nausea   Obesity (BMI 30-39.9)   Essential hypertension   Hyperlipidemia   Hypothyroidism   Asthma, chronic, unspecified asthma severity, with acute exacerbation   Hyperglycemia due to diabetes mellitus (HCC)   Leukocytosis   Prolonged QT interval   NSTEMI Multi-vessel CAD Patient presenting to the ED with acute onset chest pain while assisting a family member who fell.  Pain localized to the center of her chest with radiation to the left arm associate with nausea.  Troponin on presentation elevated at 1069.  EKG with normal sinus rhythm with no concerning ST elevation/depression or T wave inversions.  Patient was started on IV heparin drip and transferred to [facility] for cardiology evaluation. --Troponin  [id number] --LHC 11/24 w/ distal RCA 100% stenosis, mid RCA 50% stenosed, proximal and mid Cx 80% stenosed, 1rst Marg 75% stenosed, Ost LAD to proximal LAD 90% stenosed, RPDA 80% stenosed, RPAV 80% stenosed, LVEF 35-45%. --Continue heparin drip --Atorvastatin 80 mg p.o. daily --Aspirin 81 mg p.o. daily --Cardiothoracic surgery following, plan CABG 06/18/2020 --Continue to monitor on telemetry  Acute Heart failure with reduced EF Essential hypertension LVEF noted on LHC 35-40%.  TTE 06/16/2020 with LVEF 35-40%, LV moderately decreased function with regional wall motion abnormalities, mild concentric LVH, grade 2 diastolic dysfunction, LA/RA mildly dilated, trivial MR, IVC dilated, no aortic stenosis. --Carvedilol 6.25 mg p.o. twice daily --Losartan 100 mg p.o. daily --Spironolactone 25 mg p.o. daily --Strict I's and O's Daily weights  Hypomagnesemia Magnesium 1.4, will replete today. --Repeat magnesium level in a.m.  Type 2 diabetes mellitus Hemoglobin A1c 9.0, poorly controlled.  Home regimen includes Metformin 1000 mg p.o. twice daily and NovoLog 70/30 twice daily --Hold oral hypoglycemics while inpatient --Increase Lantus to 10 units subcutaneously qHS; will avoid aggressive change given patient will be NPO after midnight for planned CABG in am --Moderate insulin sliding scale for coverage --CBGs qAC/HS --Continue monitor glucose and adjust accordingly  Peripheral neuropathy: Continue gabapentin 300 mg p.o. nightly  Depression/anxiety: --Continue Lexapro 20 mg p.o. daily --ativan  PO qHS prn  Hyperlipidemia Home simvastatin changed for atorvastatin 80 mg p.o. daily due to significant CAD  Hypothyroidism: Continue levothyroxine 137 mcg p.o. daily  Asthma: --Continue Symbicort (w/ Breo Ellipta as hospital substitution) and Singulair --Albuterol MDI as needed   DVT prophylaxis: Heparin drip Code Status: Full code Family  Communication: Updated  patient's son who is present at bedside this morning  Disposition Plan:  Status is: Inpatient  Remains inpatient appropriate because:Ongoing diagnostic testing needed not appropriate for outpatient work up, Unsafe d/c plan, IV treatments appropriate due to intensity of illness or inability to take PO and Inpatient level of care appropriate due to severity of illness   Dispo: The patient is from: Home              Anticipated d/c is to: To be determined              Anticipated d/c date is: > 3 days              Patient currently is not medically stable to d/c.  Pending CABG 11/26   Consultants:   Cardiology  Cardiothoracic surgery; Dr. Vickey Sages  Procedures:   Left heart catheterization 06/16/2020, Dr. Eldridge Dace  Dist RCA lesion is 100% stenosed. Left to right collaterals.  Mid RCA lesion is 50% stenosed.  Prox Cx lesion is 80% stenosed.  Mid Cx lesion is 80% stenosed.  1st Mrg lesion is 75% stenosed. This is a small vessel and may not be a target for CABG. The OM2 is a large vessel that is likely a better bypass target.  Ost LAD to Prox LAD lesion is 90% stenosed.  There is moderate left ventricular systolic dysfunction.  LV end diastolic pressure is moderately elevated.  The left ventricular ejection fraction is 35-45% by visual estimate.  There is no aortic valve stenosis.  RPDA lesion is 80% stenosed.  RPAV lesion is 80% stenosed.   Antimicrobials:   None   Subjective: Patient seen and examined bedside, resting comfortably.  Sitting in chair, son present.  Dr. Vickey Sages present with cardiothoracic surgery discussing plan for CABG tomorrow morning.  No specific complaints or concerns at this time.  Denies headache, no visual changes, no active chest pain, no palpitations, no shortness of breath, no abdominal pain, no weakness, no fatigue, no paresthesias, no fever/chills/night sweats, no nausea/vomiting/diarrhea.  Objective: Vitals:   06/17/20 0918 06/17/20 1153  06/17/20 1202 06/17/20 1258  BP: 134/64 (!) 88/49 (!) 91/55 (!) 88/51  Pulse: 72 (!) 52  (!) 57  Resp:  20 20 20   Temp:  97.7 F (36.5 C)    TempSrc:  Oral    SpO2:  91% 95% 94%  Weight:      Height:        Intake/Output Summary (Last 24 hours) at 06/17/2020 1302 Last data filed at 06/16/2020 2300 Gross per 24 hour  Intake 253.52 ml  Output --  Net 253.52 ml   Filed Weights   06/15/20 1913 06/16/20 0030 06/17/20 0332  Weight: 105.7 kg 102.8 kg 102.9 kg    Examination:  General exam: Appears calm and comfortable  Respiratory system: Clear to auscultation. Respiratory effort normal.  Oxygenating well on room air Cardiovascular system: S1 & S2 heard, RRR. No JVD, murmurs, rubs, gallops or clicks. No pedal edema. Gastrointestinal system: Abdomen is nondistended, soft and nontender. No organomegaly or masses felt. Normal bowel sounds heard. Central nervous system: Alert and oriented. No focal neurological deficits. Extremities: Symmetric 5 x 5 power. Skin: No rashes, lesions or ulcers Psychiatry: Judgement and insight appear normal. Mood & affect appropriate.     Data Reviewed: I have personally reviewed following labs and imaging studies  CBC: Recent Labs  Lab 06/15/20 1921 06/16/20 0123 06/16/20 0232 06/17/20 0031  WBC 13.0* 12.8* 11.6* 13.4*  HGB  13.4 12.8 12.7 12.7  HCT 40.6 39.8 38.0 40.0  MCV 88.1 86.9 88.2 90.5  PLT 272 272 266 275   Basic Metabolic Panel: Recent Labs  Lab 06/15/20 1921 06/16/20 0232 06/17/20 0031  NA 134* 138 135  K 3.8 4.2 4.0  CL 103 103 103  CO2 19* 24 19*  GLUCOSE 353* 202* 251*  BUN 17 12 14   CREATININE 1.08* 1.07* 1.10*  CALCIUM 8.6* 9.0 9.1  MG  --  1.4* 1.4*  PHOS  --  3.6  --    GFR: Estimated Creatinine Clearance: 54.2 mL/min (A) (by C-G formula based on SCr of 1.1 mg/dL (H)). Liver Function Tests: Recent Labs  Lab 06/16/20 0232  AST 32  ALT 16  ALKPHOS 52  BILITOT 1.0  PROT 6.7  ALBUMIN 3.3*   No results  for input(s): LIPASE, AMYLASE in the last 168 hours. No results for input(s): AMMONIA in the last 168 hours. Coagulation Profile: Recent Labs  Lab 06/16/20 0232  INR 1.1   Cardiac Enzymes: No results for input(s): CKTOTAL, CKMB, CKMBINDEX, TROPONINI in the last 168 hours. BNP (last 3 results) No results for input(s): PROBNP in the last 8760 hours. HbA1C: Recent Labs    06/16/20 0513  HGBA1C 9.0*   CBG: Recent Labs  Lab 06/16/20 2110 06/16/20 2323 06/17/20 0411 06/17/20 0751 06/17/20 1149  GLUCAP 260* 229* 159* 163* 238*   Lipid Profile: Recent Labs    06/16/20 0513  CHOL 139  HDL 34*  LDLCALC 82  TRIG 06/18/20  CHOLHDL 4.1   Thyroid Function Tests: No results for input(s): TSH, T4TOTAL, FREET4, T3FREE, THYROIDAB in the last 72 hours. Anemia Panel: No results for input(s): VITAMINB12, FOLATE, FERRITIN, TIBC, IRON, RETICCTPCT in the last 72 hours. Sepsis Labs: No results for input(s): PROCALCITON, LATICACIDVEN in the last 168 hours.  Recent Results (from the past 240 hour(s))  Resp Panel by RT-PCR (Flu A&B, Covid) Nasopharyngeal Swab     Status: None   Collection Time: 06/15/20  9:34 PM   Specimen: Nasopharyngeal Swab; Nasopharyngeal(NP) swabs in vial transport medium  Result Value Ref Range Status   SARS Coronavirus 2 by RT PCR NEGATIVE NEGATIVE Final    Comment: (NOTE) SARS-CoV-2 target nucleic acids are NOT DETECTED.  The SARS-CoV-2 RNA is generally detectable in upper respiratory specimens during the acute phase of infection. The lowest concentration of SARS-CoV-2 viral copies this assay can detect is 138 copies/mL. A negative result does not preclude SARS-Cov-2 infection and should not be used as the sole basis for treatment or other patient management decisions. A negative result may occur with  improper specimen collection/handling, submission of specimen other than nasopharyngeal swab, presence of viral mutation(s) within the areas targeted by this  assay, and inadequate number of viral copies(<138 copies/mL). A negative result must be combined with clinical observations, patient history, and epidemiological information. The expected result is Negative.  Fact Sheet for Patients:  06/17/20  Fact Sheet for Healthcare Providers:  BloggerCourse.com  This test is no t yet approved or cleared by the [place] FDA and  has been authorized for detection and/or diagnosis of SARS-CoV-2 by FDA under an Emergency Use Authorization (EUA). This EUA will remain  in effect (meaning this test can be used) for the duration of the COVID-19 declaration under Section 564(b)(1) of the Act, 21 U.S.C.section 360bbb-3(b)(1), unless the authorization is terminated  or revoked sooner.       Influenza A by PCR NEGATIVE NEGATIVE Final   Influenza  B by PCR NEGATIVE NEGATIVE Final    Comment: (NOTE) The Xpert Xpress SARS-CoV-2/FLU/RSV plus assay is intended as an aid in the diagnosis of influenza from Nasopharyngeal swab specimens and should not be used as a sole basis for treatment. Nasal washings and aspirates are unacceptable for Xpert Xpress SARS-CoV-2/FLU/RSV testing.  Fact Sheet for Patients: BloggerCourse.com  Fact Sheet for Healthcare Providers: SeriousBroker.it  This test is not yet approved or cleared by the [place] FDA and has been authorized for detection and/or diagnosis of SARS-CoV-2 by FDA under an Emergency Use Authorization (EUA). This EUA will remain in effect (meaning this test can be used) for the duration of the COVID-19 declaration under Section 564(b)(1) of the Act, 21 U.S.C. section 360bbb-3(b)(1), unless the authorization is terminated or revoked.  Performed at [facility], [address], [place], [place] [address]   Surgical pcr screen     Status: Abnormal   Collection Time: 06/16/20 11:42 PM    Specimen: Nasal Mucosa; Nasal Swab  Result Value Ref Range Status   MRSA, PCR NEGATIVE NEGATIVE Final   Staphylococcus aureus POSITIVE (A) NEGATIVE Final    Comment: (NOTE) The Xpert SA Assay (FDA approved for NASAL specimens in patients 81 years of age and older), is one component of a comprehensive surveillance program. It is not intended to diagnose infection nor to guide or monitor treatment. Performed at [facility], [address] [place], [place] [address]          Radiology Studies: DG Chest 2 View  Result Date: 06/15/2020 CLINICAL DATA:  Chest pain EXAM: CHEST - 2 VIEW COMPARISON:  None. FINDINGS: Mild interstitial prominence. No pleural effusion or pneumothorax. Cardiomediastinal contours are within normal limits. Mild calcified plaque along the aortic arch. No acute osseous abnormality. IMPRESSION: Age-indeterminate mild interstitial prominence, which may reflect chronic changes or mild interstitial edema. Electronically Signed   By: Guadlupe Spanish M.D.   On: 06/15/2020 20:29   CT CHEST WO CONTRAST  Result Date: 06/17/2020 CLINICAL DATA:  Coronary artery disease with plan for CABG EXAM: CT CHEST WITHOUT CONTRAST TECHNIQUE: Multidetector CT imaging of the chest was performed following the standard protocol without IV contrast. COMPARISON:  X-ray 06/15/2020 FINDINGS: Cardiovascular: Mild cardiomegaly. No pericardial effusion. Thoracic aorta is nonaneurysmal. Mid ascending aorta measures 3.1 cm in diameter. There are atherosclerotic calcifications throughout the thoracic aorta. Three vessel arch. Advanced 3 vessel coronary artery atherosclerosis. Main pulmonary trunk measures 3.2 cm in diameter. Mediastinum/Nodes: No axillary or mediastinal lymphadenopathy. Evaluation of the hilar structures is somewhat limited in the absence of intravenous contrast. Heterogeneously enlarged thyroid gland with the left thyroid lobe extending into the superior mediastinum. 1.6 cm inferior  left thyroid lobe nodule with coarse calcifications. There is mild rightward deviation of the trachea secondary to enlarged left thyroid lobe. Trachea otherwise within normal limits. Small hiatal hernia. Esophagus otherwise within normal limits. Lungs/Pleura: Trace layering bilateral pleural effusions. Subtle ground-glass attenuation within the right lung base. Otherwise, no focal airspace consolidation. No pneumothorax. Upper Abdomen: No acute abnormality. Musculoskeletal: No chest wall mass or suspicious bone lesions identified. IMPRESSION: 1. Mild cardiomegaly with 3 vessel coronary artery atherosclerosis. 2. Trace layering bilateral pleural effusions with subtle ground-glass attenuation within the right lung base, which may represent atelectasis or mild edema. 3. Heterogeneously enlarged thyroid gland with the left thyroid lobe extending into the superior mediastinum. 1.6 cm inferior left thyroid lobe nodule with coarse calcifications. Recommend recommend nonemergent thyroid ultrasound (ref: J Am  Coll Radiol. 2015 Feb;12(2): 143-50). 4. Small hiatal hernia. 5. Aortic atherosclerosis. (ICD10-I70.0). Electronically Signed   By: Duanne Guess D.O.   On: 06/17/2020 11:25   CARDIAC CATHETERIZATION  Result Date: 06/16/2020  Dist RCA lesion is 100% stenosed. Left to right collaterals.  Mid RCA lesion is 50% stenosed.  Prox Cx lesion is 80% stenosed.  Mid Cx lesion is 80% stenosed.  1st Mrg lesion is 75% stenosed. This is a small vessel and may not be a target for CABG. The OM2 is a large vessel that is likely a better bypass target.  Ost LAD to Prox LAD lesion is 90% stenosed.  There is moderate left ventricular systolic dysfunction.  LV end diastolic pressure is moderately elevated.  The left ventricular ejection fraction is 35-45% by visual estimate.  There is no aortic valve stenosis.  RPDA lesion is 80% stenosed.  RPAV lesion is 80% stenosed.  Severe three vessel disease. Will obtain CVTS  consult for CABG.  Medical therapy for LV dysfunction.  Likely will need grafts to LAD, OM2, PDA, PLA   ECHOCARDIOGRAM COMPLETE  Result Date: 06/16/2020    ECHOCARDIOGRAM REPORT   Patient Name:   TING CAGE Date of Exam: 06/16/2020 Medical Rec #:  [id number]    Height:       65.0 in Accession #:    [id number]   Weight:       226.6 lb Date of Birth:  10-16-1945     BSA:          2.086 m Patient Age:    74 years     BP:           129/65 mmHg Patient Gender: F            HR:           69 bpm. Exam Location:  Inpatient Procedure: 2D Echo, 3D Echo, Color Doppler, Cardiac Doppler and Intracardiac            Opacification Agent Indications:    Acute Coronary Syndrome i24.9  History:        Patient has no prior history of Echocardiogram examinations.                 NSTEMI; Risk Factors:Hypertension, Diabetes and Dyslipidemia.  Sonographer:    Irving Burton Senior RDCS Referring Phys: [phone] [address] IMPRESSIONS  1. Left ventricular ejection fraction, by estimation, is 35 to 40%. The left ventricle has moderately decreased function. The left ventricle demonstrates regional wall motion abnormalities (see scoring diagram/findings for description). There is mild concentric left ventricular hypertrophy. Left ventricular diastolic parameters are consistent with Grade II diastolic dysfunction (pseudonormalization). Elevated left ventricular end-diastolic pressure.  2. Right ventricular systolic function is normal. The right ventricular size is normal. There is mildly elevated pulmonary artery systolic pressure.  3. Left atrial size was mildly dilated.  4. Right atrial size was mildly dilated.  5. The mitral valve is normal in structure. Trivial mitral valve regurgitation. No evidence of mitral stenosis.  6. The aortic valve is grossly normal. There is mild calcification of the aortic valve. Aortic valve regurgitation is not visualized. No aortic stenosis is present.  7. The inferior vena cava is dilated in size with <50%  respiratory variability, suggesting right atrial pressure of 15 mmHg. Conclusion(s)/Recommendation(s): Reduced LVEF, focal wall motion abnormalities as noted. No LV thrombus seen with contrast. FINDINGS  Left Ventricle: There is an area of concern for thrombus at the apex, but it does not  fully exclude contrast with definity. Therefore, not consistent with LV thrombus. Left ventricular ejection fraction, by estimation, is 35 to 40%. The left ventricle has moderately decreased function. The left ventricle demonstrates regional wall motion abnormalities. The left ventricular internal cavity size was normal in size. There is mild concentric left ventricular hypertrophy. Left ventricular diastolic parameters are consistent with Grade II diastolic dysfunction (pseudonormalization). Elevated left ventricular end-diastolic pressure.  LV Wall Scoring: The mid and distal anterior wall and apex are akinetic. The mid and distal lateral wall, mid and distal anterior septum, entire inferior wall, mid anterolateral segment, mid inferoseptal segment, and basal anterior segment are hypokinetic. The basal anteroseptal segment, basal inferolateral segment, basal anterolateral segment, and basal inferoseptal segment are normal. Right Ventricle: The right ventricular size is normal. No increase in right ventricular wall thickness. Right ventricular systolic function is normal. There is mildly elevated pulmonary artery systolic pressure. The tricuspid regurgitant velocity is 2.58  m/s, and with an assumed right atrial pressure of 15 mmHg, the estimated right ventricular systolic pressure is 41.6 mmHg. Left Atrium: Left atrial size was mildly dilated. Right Atrium: Right atrial size was mildly dilated. Pericardium: Trivial pericardial effusion is present. Presence of pericardial fat pad. Mitral Valve: The mitral valve is normal in structure. Trivial mitral valve regurgitation. No evidence of mitral valve stenosis. Tricuspid Valve: The  tricuspid valve is normal in structure. Tricuspid valve regurgitation is trivial. No evidence of tricuspid stenosis. Aortic Valve: The aortic valve is grossly normal. There is mild calcification of the aortic valve. Aortic valve regurgitation is not visualized. No aortic stenosis is present. Pulmonic Valve: The pulmonic valve was not well visualized. Pulmonic valve regurgitation is trivial. No evidence of pulmonic stenosis. Aorta: The aortic root and ascending aorta are structurally normal, with no evidence of dilitation. Venous: The inferior vena cava is dilated in size with less than 50% respiratory variability, suggesting right atrial pressure of 15 mmHg. IAS/Shunts: The atrial septum is grossly normal.  LEFT VENTRICLE PLAX 2D LVIDd:         4.70 cm      Diastology LVIDs:         3.10 cm      LV e' medial:    3.81 cm/s LV PW:         1.20 cm      LV E/e' medial:  31.2 LV IVS:        1.30 cm      LV e' lateral:   3.59 cm/s LVOT diam:     2.10 cm      LV E/e' lateral: 33.1 LV SV:         60 LV SV Index:   29 LVOT Area:     3.46 cm  LV Volumes (MOD) LV vol d, MOD A2C: 105.0 ml LV vol d, MOD A4C: 96.6 ml LV vol s, MOD A2C: 64.1 ml LV vol s, MOD A4C: 50.8 ml LV SV MOD A2C:     40.9 ml LV SV MOD A4C:     96.6 ml LV SV MOD BP:      43.2 ml RIGHT VENTRICLE RV S prime:     8.59 cm/s TAPSE (M-mode): 2.3 cm LEFT ATRIUM             Index       RIGHT ATRIUM           Index LA diam:        4.10 cm 1.97 cm/m  RA Area:  17.40 cm LA Vol (A2C):   52.1 ml 24.98 ml/m RA Volume:   49.40 ml  23.68 ml/m LA Vol (A4C):   51.6 ml 24.74 ml/m LA Biplane Vol: 55.7 ml 26.70 ml/m  AORTIC VALVE LVOT Vmax:   75.70 cm/s LVOT Vmean:  58.300 cm/s LVOT VTI:    0.172 m  AORTA Ao Root diam: 2.80 cm Ao Asc diam:  3.50 cm MITRAL VALVE                TRICUSPID VALVE MV Area (PHT): 3.31 cm     TR Peak grad:   26.6 mmHg MV Decel Time: 229 msec     TR Vmax:        258.00 cm/s MV E velocity: 119.00 cm/s MV A velocity: 111.00 cm/s  SHUNTS MV E/A  ratio:  1.07         Systemic VTI:  0.17 m                             Systemic Diam: 2.10 cm Jodelle Red MD Electronically signed by Jodelle Red MD Signature Date/Time: 06/16/2020/7:15:49 PM    Final         Scheduled Meds: . aspirin EC  81 mg Oral Daily  . atorvastatin  80 mg Oral Daily  . bisacodyl  5 mg Oral Once  . carvedilol  6.25 mg Oral BID WC  . Chlorhexidine Gluconate Cloth  6 each Topical Daily  . [START ON 06/18/2020] epinephrine  0-10 mcg/min Intravenous To OR  . fluticasone  2 spray Each Nare Daily  . fluticasone furoate-vilanterol  1 puff Inhalation Daily  . gabapentin  300 mg Oral QHS  . [START ON 06/18/2020] heparin-papaverine-plasmalyte irrigation   Irrigation To OR  . insulin aspart  0-15 Units Subcutaneous Q4H  . insulin glargine  5 Units Subcutaneous QHS  . [START ON 06/18/2020] insulin   Intravenous To OR  . levothyroxine  137 mcg Oral QAC breakfast  . losartan  100 mg Oral Daily  . [START ON 06/18/2020] magnesium sulfate  40 mEq Other To OR  . [START ON 06/18/2020] metoprolol tartrate  12.5 mg Oral Once  . montelukast  10 mg Oral QHS  . mupirocin ointment  1 application Nasal BID  . [START ON 06/18/2020] phenylephrine  30-200 mcg/min Intravenous To OR  . [START ON 06/18/2020] potassium chloride  80 mEq Other To OR  . sodium chloride flush  3 mL Intravenous Q12H  . spironolactone  25 mg Oral Daily  . [START ON 06/18/2020] tranexamic acid  15 mg/kg Intravenous To OR  . [START ON 06/18/2020] tranexamic acid  2 mg/kg Intracatheter To OR   Continuous Infusions: . sodium chloride    . [START ON 06/18/2020] dexmedetomidine    . [START ON 06/18/2020] heparin 30,000 units/NS 1000 mL solution for CELLSAVER    . heparin 1,300 Units/hr (06/17/20 0229)  . [START ON 06/18/2020] levofloxacin (LEVAQUIN) IV    . [START ON 06/18/2020] milrinone    . [START ON 06/18/2020] nitroGLYCERIN    . [START ON 06/18/2020] norepinephrine    . [START ON  06/18/2020] tranexamic acid (CYKLOKAPRON) infusion ([facility])    . [START ON 06/18/2020] vancomycin       LOS: 1 day    Time spent: 38 minutes spent on chart review, discussion with nursing staff, consultants, updating family and interview/physical exam; more than 50% of that time was spent in counseling and/or coordination of  care.    Alvira Philips [place], DO [facility] Available via Epic secure chat 7am-7pm After these hours, please refer to coverage provider listed on [email or web address] 06/17/2020, 1:02 PM

## 2020-06-17 NOTE — Consult Note (Signed)
[address]       [place] [address]             [phone]        [facility] Medical Record #[id number] Date of Birth: October 06, 1945  Referring: No ref. provider found Primary Care: System, Provider Not In Primary Cardiologist:No primary care provider on file.  Chief Complaint:    Chief Complaint  Patient presents with  . Chest Pain    History of Present Illness:      74 yo lady with HTN, HL, DM and OSA experienced sudden, relatively mild chest pain but it was severe and persistent enough to get her attention. Called 911 who suggested ED visit. There, NSTEMI dx'd. 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 Consult for CABG. Stable sx on heparin. Denies CP or SOB.  Current Activity/ Functional Status: Patient will be independent with mobility/ambulation, transfers, ADL's, IADL's.   Zubrod Score: At the time of surgery this patient's most appropriate activity status/level should be described as: []     0    Normal activity, no symptoms []     1    Restricted in physical strenuous activity but ambulatory, able to do out light work []     2    Ambulatory and capable of self care, unable to do work activities, up and about                 more than 50%  Of the time                            []     3    Only limited self care, in bed greater than 50% of waking hours []     4    Completely disabled, no self care, confined to bed or chair []     5    Moribund  Past Medical History:  Diagnosis Date  . Arthritis   . Asthma   . Diabetes mellitus without complication (HCC)   . Hypercholesteremia   . Hypertension   . Hypothyroidism     Past Surgical History:  Procedure Laterality Date  . LEFT HEART CATH AND CORONARY ANGIOGRAPHY N/A 06/16/2020   Procedure: LEFT HEART CATH AND CORONARY ANGIOGRAPHY;  Surgeon: , MD;  Location: [facility] INVASIVE CV LAB;  Service: Cardiovascular;  Laterality: N/A;  .  THYROID SURGERY    . TUBAL LIGATION      Social History   Tobacco Use  Smoking Status Never Smoker  Smokeless Tobacco Never Used    Social History   Substance and Sexual Activity  Alcohol Use No     Allergies  Allergen Reactions  . Cefaclor   . Codeine   . Penicillins   . Sulfa Antibiotics   . Trazodone And Nefazodone   . Vibramycin [Doxycycline Calcium]   . Erythromycin Rash    Current Facility-Administered Medications  Medication Dose Route Frequency Provider Last Rate Last Admin  . 0.9 %  sodium chloride infusion  250 mL Intravenous PRN , MD      . acetaminophen (TYLENOL) tablet 650 mg  650 mg Oral Q4H PRN , MD      . aspirin EC tablet 81 mg  81 mg Oral Daily , MD   81 mg at 06/17/20 0917  . atorvastatin (LIPITOR) tablet 80 mg  80 mg  Oral Daily Corky Crafts, MD   80 mg at 06/17/20 6045  . carvedilol (COREG) tablet 6.25 mg  6.25 mg Oral BID WC Corky Crafts, MD   6.25 mg at 06/17/20 4098  . Chlorhexidine Gluconate Cloth 2 % PADS 6 each  6 each Topical Daily [place], Eric J, DO      . [START ON 06/18/2020] dexmedetomidine (PRECEDEX) 400 MCG/100ML (4 mcg/mL) infusion  0.1-0.7 mcg/kg/hr Intravenous To OR Kortny Lirette, Merri Brunette, MD      . Melene Muller ON 06/18/2020] EPINEPHrine (ADRENALIN) 4 mg in NS 250 mL (0.016 mg/mL) premix infusion  0-10 mcg/min Intravenous To OR Delvin Hedeen, Merri Brunette, MD      . fluticasone (FLONASE) 50 MCG/ACT nasal spray 2 spray  2 spray Each Nare Daily Corky Crafts, MD   2 spray at 06/16/20 2158  . fluticasone furoate-vilanterol (BREO ELLIPTA) 100-25 MCG/INH 1 puff  1 puff Inhalation Daily [place], Alvira Philips, DO   1 puff at 06/17/20 0754  . gabapentin (NEURONTIN) capsule 300 mg  300 mg Oral QHS [place], Eric J, DO   300 mg at 06/16/20 2156  . [START ON 06/18/2020] heparin 30,000 units/NS 1000 mL solution for CELLSAVER   Other To OR Shalva Rozycki Z, MD      . heparin ADULT infusion 100  units/mL (25000 units/259mL sodium chloride 0.45%)  1,300 Units/hr Intravenous Continuous [place], Eric J, DO 13 mL/hr at 06/17/20 0229 1,300 Units/hr at 06/17/20 0229  . [START ON 06/18/2020] heparin sodium (porcine) 2,500 Units, papaverine 30 mg in electrolyte-148 (PLASMALYTE-148) 500 mL irrigation   Irrigation To OR Hector Venne Z, MD      . insulin aspart (novoLOG) injection 0-15 Units  0-15 Units Subcutaneous Q4H Corky Crafts, MD   3 Units at 06/17/20 [phone]  . insulin glargine (LANTUS) injection 5 Units  5 Units Subcutaneous QHS [place], Eric J, DO   5 Units at 06/16/20 2157  . [START ON 06/18/2020] insulin regular, human (MYXREDLIN) 100 units/ 100 mL infusion   Intravenous To OR Lailee Hoelzel Z, MD      . ipratropium-albuterol (DUONEB) 0.5-2.5 (3) MG/3ML nebulizer solution 3 mL  3 mL Nebulization Q6H PRN [place], Eric J, DO      . [START ON 06/18/2020] levofloxacin (LEVAQUIN) IVPB 500 mg  500 mg Intravenous To OR Chanler Mendonca, Merri Brunette, MD      . levothyroxine (SYNTHROID) tablet 137 mcg  137 mcg Oral QAC breakfast Corky Crafts, MD   137 mcg at 06/17/20 0601  . LORazepam (ATIVAN) tablet 1 mg  1 mg Oral QHS PRN [place], Eric J, DO   1 mg at 06/16/20 2348  . losartan (COZAAR) tablet 100 mg  100 mg Oral Daily Corky Crafts, MD   100 mg at 06/17/20 4782  . [START ON 06/18/2020] magnesium sulfate (IV Push/IM) injection 40 mEq  40 mEq Other To OR Laura-Lee Villegas Z, MD      . magnesium sulfate IVPB 4 g 100 mL  4 g Intravenous Once [place], Eric J, DO 50 mL/hr at 06/17/20 0931 4 g at 06/17/20 0931  . [START ON 06/18/2020] milrinone (PRIMACOR) 20 MG/100 ML (0.2 mg/mL) infusion  0.3 mcg/kg/min Intravenous To OR Chae Shuster Z, MD      . montelukast (SINGULAIR) tablet 10 mg  10 mg Oral QHS [place], Alvira Philips, DO   10 mg at 06/16/20 2156  . mupirocin ointment (BACTROBAN) 2 % 1 application  1 application Nasal BID [place], Alvira Philips, DO  1 application at 06/17/20 0933  . [START ON  06/18/2020] nitroGLYCERIN 50 mg in dextrose 5 % 250 mL (0.2 mg/mL) infusion  2-200 mcg/min Intravenous To OR Haliey Romberg, Merri BrunetteBroadus Z, MD      . Melene Muller[START ON 06/18/2020] norepinephrine (LEVOPHED) 4mg  in 250mL premix infusion  0-40 mcg/min Intravenous To OR Krisha Beegle, Merri BrunetteBroadus Z, MD      . Melene Muller[START ON 06/18/2020] phenylephrine (NEOSYNEPHRINE) 20-0.9 MG/250ML-% infusion  30-200 mcg/min Intravenous To OR Linden DolinAtkins, Mathhew Buysse Z, MD      . Melene Muller[START ON 06/18/2020] potassium chloride injection 80 mEq  80 mEq Other To OR Lilybelle Mayeda, Merri BrunetteBroadus Z, MD      . prochlorperazine (COMPAZINE) tablet 10 mg  10 mg Oral Q6H PRN Lance MussVaranasi, Jayadeep S, MD      . sodium chloride flush (NS) 0.9 % injection 3 mL  3 mL Intravenous Q12H Corky CraftsVaranasi, Jayadeep S, MD   3 mL at 06/16/20 2157  . sodium chloride flush (NS) 0.9 % injection 3 mL  3 mL Intravenous PRN Corky CraftsVaranasi, Jayadeep S, MD      . spironolactone (ALDACTONE) tablet 25 mg  25 mg Oral Daily Corky CraftsVaranasi, Jayadeep S, MD   25 mg at 06/17/20 [id number]  . [START ON 06/18/2020] tranexamic acid (CYKLOKAPRON) 2,500 mg in sodium chloride 0.9 % 250 mL (10 mg/mL) infusion  1.5 mg/kg/hr Intravenous To OR Jovoni Borkenhagen, Merri BrunetteBroadus Z, MD      . Melene Muller[START ON 06/18/2020] tranexamic acid (CYKLOKAPRON) bolus via infusion - over 30 minutes 1,543.5 mg  15 mg/kg Intravenous To OR Naquan Garman, Merri BrunetteBroadus Z, MD      . Melene Muller[START ON 06/18/2020] tranexamic acid (CYKLOKAPRON) pump prime solution 206 mg  2 mg/kg Intracatheter To OR Yolandra Habig, Merri BrunetteBroadus Z, MD      . Melene Muller[START ON 06/18/2020] vancomycin (VANCOREADY) IVPB 1500 mg/300 mL  1,500 mg Intravenous To OR Dinita Migliaccio, Merri BrunetteBroadus Z, MD        Medications Prior to Admission  Medication Sig Dispense Refill Last Dose  . acetaminophen (TYLENOL) 500 MG tablet Take 500 mg by mouth every 6 (six) hours as needed for mild pain, fever or headache.   06/15/2020 at Unknown time  . albuterol (PROVENTIL HFA;VENTOLIN HFA) 108 (90 Base) MCG/ACT inhaler Inhale 2 puffs into the lungs every 6 (six) hours as needed for wheezing or shortness  of breath.   Past Week at Unknown time  . aspirin 81 MG tablet Take 81 mg by mouth daily.   06/15/2020 at Unknown time  . carvedilol (COREG) 6.25 MG tablet Take 6.25 mg by mouth 2 (two) times daily.   06/15/2020 at 1200  . Cholecalciferol (VITAMIN D) 50 MCG (2000 UT) CAPS Take 2,000 Units by mouth daily.   06/14/2020  . Cyanocobalamin (B-12 PO) Take 1 tablet by mouth daily.   06/14/2020  . escitalopram (LEXAPRO) 20 MG tablet Take 20 mg by mouth daily.   06/15/2020 at Unknown time  . fluticasone (FLONASE) 50 MCG/ACT nasal spray Place 1 spray into both nostrils daily.   06/14/2020  . gabapentin (NEURONTIN) 100 MG capsule Take 300 mg by mouth at bedtime.   06/14/2020  . levothyroxine (SYNTHROID) 137 MCG tablet Take 137 mcg by mouth daily.   06/15/2020 at Unknown time  . LORazepam (ATIVAN) 1 MG tablet Take 1.25 mg by mouth at bedtime.   06/14/2020  . losartan (COZAAR) 100 MG tablet Take 100 mg by mouth daily.   06/15/2020 at Unknown time  . metFORMIN (GLUCOPHAGE-XR) 500 MG 24 hr tablet Take 1,000 mg by mouth 2 (  two) times daily.   06/15/2020 at Unknown time  . metoprolol succinate (TOPROL-XL) 25 MG 24 hr tablet Take 25 mg by mouth daily.   06/15/2020 at 1200  . montelukast (SINGULAIR) 10 MG tablet Take 10 mg by mouth at bedtime.   06/14/2020  . NOVOLOG MIX 70/30 FLEXPEN (70-30) 100 UNIT/ML FlexPen Inject 0-70 Units into the skin 2 (two) times daily.   06/15/2020 at Unknown time  . polyvinyl alcohol (LIQUIFILM TEARS) 1.4 % ophthalmic solution Place 1 drop into both eyes as needed for dry eyes.   Past Week at Unknown time  . rOPINIRole (REQUIP) 0.25 MG tablet Take 0.25-0.5 mg by mouth at bedtime.   06/14/2020  . simvastatin (ZOCOR) 20 MG tablet Take 20 mg by mouth daily.   06/14/2020  . spironolactone (ALDACTONE) 25 MG tablet Take 50 mg by mouth 2 (two) times daily.    06/15/2020 at Unknown time  . SYMBICORT 80-4.5 MCG/ACT inhaler Inhale 2 puffs into the lungs daily.   06/15/2020 at Unknown time     History reviewed. No pertinent family history.   Review of Systems:   ROS Pertinent items are noted in HPI.     Cardiac Review of Systems: Y or  [    ]= no  Chest Pain [    ]  Resting SOB [   ] Exertional SOB  [  ]  Orthopnea [  ]   Pedal Edema [   ]    Palpitations [  ] Syncope  [  ]   Presyncope [   ]  General Review of Systems: [Y] = yes [  ]=no Constitional: recent weight change [  ]; anorexia [  ]; fatigue [  ]; nausea [  ]; night sweats [  ]; fever [  ]; or chills [  ]                                                               Dental: Last Dentist visit:   Eye : blurred vision [  ]; diplopia [   ]; vision changes [  ];  Amaurosis fugax[  ]; Resp: cough [  ];  wheezing[  ];  hemoptysis[  ]; shortness of breath[  ]; paroxysmal nocturnal dyspnea[  ]; dyspnea on exertion[  ]; or orthopnea[  ];  GI:  gallstones[  ], vomiting[  ];  dysphagia[  ]; melena[  ];  hematochezia [  ]; heartburn[  ];   Hx of  Colonoscopy[  ]; GU: kidney stones [  ]; hematuria[  ];   dysuria [  ];  nocturia[  ];  history of     obstruction [  ]; urinary frequency [  ]             Skin: rash, swelling[  ];, hair loss[  ];  peripheral edema[  ];  or itching[  ]; Musculosketetal: myalgias[  ];  joint swelling[  ];  joint erythema[  ];  joint pain[  ];  back pain[  ];  Heme/Lymph: bruising[  ];  bleeding[  ];  anemia[  ];  Neuro: TIA[  ];  headaches[  ];  stroke[  ];  vertigo[  ];  seizures[  ];   paresthesias[  ];  difficulty walking[  ];  Psych:depression[  ]; anxiety[  ];  Endocrine: diabetes[  ];  thyroid dysfunction[  ];              Physical Exam: BP 134/64   Pulse 72   Temp 98.1 F (36.7 C) (Oral)   Resp (!) 22   Ht 5\' 5"  (1.651 m)   Wt 102.9 kg   SpO2 97%   BMI 37.74 kg/m    General appearance: alert and cooperative Head: Normocephalic, without obvious abnormality, atraumatic Neck: no adenopathy, no carotid bruit, no JVD, supple, symmetrical, trachea midline and thyroid not enlarged,  symmetric, no tenderness/mass/nodules Resp: clear to auscultation bilaterally Cardio: regular rate and rhythm, S1, S2 normal, no murmur, click, rub or gallop GI: soft, non-tender; bowel sounds normal; no masses,  no organomegaly Extremities: extremities normal, atraumatic, no cyanosis or edema Neurologic: Alert and oriented X 3, normal strength and tone. Normal symmetric reflexes. Normal coordination and gait  Diagnostic Studies & Laboratory data:     Recent Radiology Findings:   DG Chest 2 View  Result Date: 06/15/2020 CLINICAL DATA:  Chest pain EXAM: CHEST - 2 VIEW COMPARISON:  None. FINDINGS: Mild interstitial prominence. No pleural effusion or pneumothorax. Cardiomediastinal contours are within normal limits. Mild calcified plaque along the aortic arch. No acute osseous abnormality. IMPRESSION: Age-indeterminate mild interstitial prominence, which may reflect chronic changes or mild interstitial edema. Electronically Signed   By: 06/17/2020 M.D.   On: 06/15/2020 20:29   CARDIAC CATHETERIZATION  Result Date: 06/16/2020  Dist RCA lesion is 100% stenosed. Left to right collaterals.  Mid RCA lesion is 50% stenosed.  Prox Cx lesion is 80% stenosed.  Mid Cx lesion is 80% stenosed.  1st Mrg lesion is 75% stenosed. This is a small vessel and may not be a target for CABG. The OM2 is a large vessel that is likely a better bypass target.  Ost LAD to Prox LAD lesion is 90% stenosed.  There is moderate left ventricular systolic dysfunction.  LV end diastolic pressure is moderately elevated.  The left ventricular ejection fraction is 35-45% by visual estimate.  There is no aortic valve stenosis.  RPDA lesion is 80% stenosed.  RPAV lesion is 80% stenosed.  Severe three vessel disease. Will obtain CVTS consult for CABG.  Medical therapy for LV dysfunction.  Likely will need grafts to LAD, OM2, PDA, PLA   ECHOCARDIOGRAM COMPLETE  Result Date: 06/16/2020    ECHOCARDIOGRAM REPORT   Patient  Name:   EISA CONAWAY Date of Exam: 06/16/2020 Medical Rec #:  06/18/2020    Height:       65.0 in Accession #:    [id number]   Weight:       226.6 lb Date of Birth:  10-24-1945     BSA:          2.086 m Patient Age:    74 years     BP:           129/65 mmHg Patient Gender: F            HR:           69 bpm. Exam Location:  Inpatient Procedure: 2D Echo, 3D Echo, Color Doppler, Cardiac Doppler and Intracardiac            Opacification Agent Indications:    Acute Coronary Syndrome i24.9  History:        Patient has no prior history of Echocardiogram examinations.  NSTEMI; Risk Factors:Hypertension, Diabetes and Dyslipidemia.  Sonographer:    Irving Burton Senior RDCS Referring Phys: [phone] [address] IMPRESSIONS  1. Left ventricular ejection fraction, by estimation, is 35 to 40%. The left ventricle has moderately decreased function. The left ventricle demonstrates regional wall motion abnormalities (see scoring diagram/findings for description). There is mild concentric left ventricular hypertrophy. Left ventricular diastolic parameters are consistent with Grade II diastolic dysfunction (pseudonormalization). Elevated left ventricular end-diastolic pressure.  2. Right ventricular systolic function is normal. The right ventricular size is normal. There is mildly elevated pulmonary artery systolic pressure.  3. Left atrial size was mildly dilated.  4. Right atrial size was mildly dilated.  5. The mitral valve is normal in structure. Trivial mitral valve regurgitation. No evidence of mitral stenosis.  6. The aortic valve is grossly normal. There is mild calcification of the aortic valve. Aortic valve regurgitation is not visualized. No aortic stenosis is present.  7. The inferior vena cava is dilated in size with <50% respiratory variability, suggesting right atrial pressure of 15 mmHg. Conclusion(s)/Recommendation(s): Reduced LVEF, focal wall motion abnormalities as noted. No LV thrombus seen with contrast.  FINDINGS  Left Ventricle: There is an area of concern for thrombus at the apex, but it does not fully exclude contrast with definity. Therefore, not consistent with LV thrombus. Left ventricular ejection fraction, by estimation, is 35 to 40%. The left ventricle has moderately decreased function. The left ventricle demonstrates regional wall motion abnormalities. The left ventricular internal cavity size was normal in size. There is mild concentric left ventricular hypertrophy. Left ventricular diastolic parameters are consistent with Grade II diastolic dysfunction (pseudonormalization). Elevated left ventricular end-diastolic pressure.  LV Wall Scoring: The mid and distal anterior wall and apex are akinetic. The mid and distal lateral wall, mid and distal anterior septum, entire inferior wall, mid anterolateral segment, mid inferoseptal segment, and basal anterior segment are hypokinetic. The basal anteroseptal segment, basal inferolateral segment, basal anterolateral segment, and basal inferoseptal segment are normal. Right Ventricle: The right ventricular size is normal. No increase in right ventricular wall thickness. Right ventricular systolic function is normal. There is mildly elevated pulmonary artery systolic pressure. The tricuspid regurgitant velocity is 2.58  m/s, and with an assumed right atrial pressure of 15 mmHg, the estimated right ventricular systolic pressure is 41.6 mmHg. Left Atrium: Left atrial size was mildly dilated. Right Atrium: Right atrial size was mildly dilated. Pericardium: Trivial pericardial effusion is present. Presence of pericardial fat pad. Mitral Valve: The mitral valve is normal in structure. Trivial mitral valve regurgitation. No evidence of mitral valve stenosis. Tricuspid Valve: The tricuspid valve is normal in structure. Tricuspid valve regurgitation is trivial. No evidence of tricuspid stenosis. Aortic Valve: The aortic valve is grossly normal. There is mild calcification  of the aortic valve. Aortic valve regurgitation is not visualized. No aortic stenosis is present. Pulmonic Valve: The pulmonic valve was not well visualized. Pulmonic valve regurgitation is trivial. No evidence of pulmonic stenosis. Aorta: The aortic root and ascending aorta are structurally normal, with no evidence of dilitation. Venous: The inferior vena cava is dilated in size with less than 50% respiratory variability, suggesting right atrial pressure of 15 mmHg. IAS/Shunts: The atrial septum is grossly normal.  LEFT VENTRICLE PLAX 2D LVIDd:         4.70 cm      Diastology LVIDs:         3.10 cm      LV e' medial:    3.81 cm/s  LV PW:         1.20 cm      LV E/e' medial:  31.2 LV IVS:        1.30 cm      LV e' lateral:   3.59 cm/s LVOT diam:     2.10 cm      LV E/e' lateral: 33.1 LV SV:         60 LV SV Index:   29 LVOT Area:     3.46 cm  LV Volumes (MOD) LV vol d, MOD A2C: 105.0 ml LV vol d, MOD A4C: 96.6 ml LV vol s, MOD A2C: 64.1 ml LV vol s, MOD A4C: 50.8 ml LV SV MOD A2C:     40.9 ml LV SV MOD A4C:     96.6 ml LV SV MOD BP:      43.2 ml RIGHT VENTRICLE RV S prime:     8.59 cm/s TAPSE (M-mode): 2.3 cm LEFT ATRIUM             Index       RIGHT ATRIUM           Index LA diam:        4.10 cm 1.97 cm/m  RA Area:     17.40 cm LA Vol (A2C):   52.1 ml 24.98 ml/m RA Volume:   49.40 ml  23.68 ml/m LA Vol (A4C):   51.6 ml 24.74 ml/m LA Biplane Vol: 55.7 ml 26.70 ml/m  AORTIC VALVE LVOT Vmax:   75.70 cm/s LVOT Vmean:  58.300 cm/s LVOT VTI:    0.172 m  AORTA Ao Root diam: 2.80 cm Ao Asc diam:  3.50 cm MITRAL VALVE                TRICUSPID VALVE MV Area (PHT): 3.31 cm     TR Peak grad:   26.6 mmHg MV Decel Time: 229 msec     TR Vmax:        258.00 cm/s MV E velocity: 119.00 cm/s MV A velocity: 111.00 cm/s  SHUNTS MV E/A ratio:  1.07         Systemic VTI:  0.17 m                             Systemic Diam: 2.10 cm Jodelle Red MD Electronically signed by Jodelle Red MD Signature Date/Time:  06/16/2020/7:15:49 PM    Final      I have independently reviewed the above radiologic studies and discussed with the patient   Recent Lab Findings: Lab Results  Component Value Date   WBC 13.4 (H) 06/17/2020   HGB 12.7 06/17/2020   HCT 40.0 06/17/2020   PLT 275 06/17/2020   GLUCOSE 251 (H) 06/17/2020   CHOL 139 06/16/2020   TRIG 116 06/16/2020   HDL 34 (L) 06/16/2020   LDLCALC 82 06/16/2020   ALT 16 06/16/2020   AST 32 06/16/2020   NA 135 06/17/2020   K 4.0 06/17/2020   CL 103 06/17/2020   CREATININE 1.10 (H) 06/17/2020   BUN 14 06/17/2020   CO2 19 (L) 06/17/2020   INR 1.1 06/16/2020   HGBA1C 9.0 (H) 06/16/2020      Assessment / Plan:        74 yo with multivessel CAD; agree that especially in diabetic with high SYNTAX score, cABG is best option. She appears to be a good candidate, pending completion of preop studies. Plan  CABG, likely multi-arterial grafting strategy.   I  spent 30 minutes counseling the patient face to face.   Treyvion Durkee Z. Vickey Sages, MD [phone] 06/17/2020 9:51 AM

## 2020-06-17 NOTE — Progress Notes (Signed)
ANTICOAGULATION CONSULT NOTE   Pharmacy Consult for Heparin Indication: chest pain/ACS  Allergies  Allergen Reactions  . Cefaclor   . Codeine   . Penicillins   . Sulfa Antibiotics   . Trazodone And Nefazodone   . Vibramycin [Doxycycline Calcium]   . Erythromycin Rash    Patient Measurements: Height: 5\' 5"  (165.1 cm) Weight: 102.9 kg (226 lb 12.8 oz) IBW/kg (Calculated) : 57 Heparin Dosing Weight: 81.6kg  Vital Signs: Temp: 98.1 F (36.7 C) (11/25 0751) Temp Source: Oral (11/25 0751) BP: 134/64 (11/25 0918) Pulse Rate: 72 (11/25 0918)  Labs: Recent Labs    06/15/20 1921 06/15/20 2134 06/16/20 0123 06/16/20 0123 06/16/20 0232 06/16/20 0513 06/16/20 0736 06/17/20 0031 06/17/20 0831  HGB 13.4   < > 12.8   < > 12.7  --   --  12.7  --   HCT 40.6   < > 39.8  --  38.0  --   --  40.0  --   PLT 272   < > 272  --  266  --   --  275  --   APTT  --   --   --   --  56*  --   --   --   --   LABPROT  --   --   --   --  14.2  --   --   --   --   INR  --   --   --   --  1.1  --   --   --   --   HEPARINUNFRC  --   --   --   --   --  0.18*  --  0.20* 0.33  CREATININE 1.08*  --   --   --  1.07*  --   --  1.10*  --   TROPONINIHS 1,069*   < > 9,657*   < > 10,270* 10,433* 9,038*  --   --    < > = values in this interval not displayed.    Estimated Creatinine Clearance: 54.2 mL/min (A) (by C-G formula based on SCr of 1.1 mg/dL (H)).   Medical History: Past Medical History:  Diagnosis Date  . Arthritis   . Asthma   . Diabetes mellitus without complication (HCC)   . Hypercholesteremia   . Hypertension   . Hypothyroidism     Medications:  Infusions:  . sodium chloride    . [START ON 06/18/2020] dexmedetomidine    . [START ON 06/18/2020] heparin 30,000 units/NS 1000 mL solution for CELLSAVER    . heparin 1,300 Units/hr (06/17/20 0229)  . [START ON 06/18/2020] levofloxacin (LEVAQUIN) IV    . magnesium sulfate bolus IVPB 4 g (06/17/20 0931)  . [START ON 06/18/2020]  milrinone    . [START ON 06/18/2020] nitroGLYCERIN    . [START ON 06/18/2020] norepinephrine    . [START ON 06/18/2020] tranexamic acid (CYKLOKAPRON) infusion ([facility])    . [START ON 06/18/2020] vancomycin      Assessment: 74 yof presented to the ED with CP and SOB. She is now s/p cath with multivessel CAD. Heparin was restarted 8 hours post sheath removal. Plan for CABG on 11/26.  Heparin level today is therapeutic at 0.33 after rate increased to 1300 units/hour. Hgb and platelets WNL and stable. No bleeding reported.  Goal of Therapy:  Heparin level 0.3-0.7 units/ml Monitor platelets by anticoagulation protocol: Yes   Plan:  Continue heparin 1300 units/hr Confirmatory heparin level in 8  hours Daily heparin level and CBC  Laverna Peace, PharmD PGY-1 Pharmacy Resident 06/17/2020 10:35 AM Please see AMION for all pharmacy numbers

## 2020-06-17 NOTE — Progress Notes (Signed)
ANTICOAGULATION CONSULT NOTE   Pharmacy Consult for Heparin Indication: chest pain/ACS  Assessment: 27 yof presented to the ED with CP and SOB. She is now s/p cath with multivessel CAD and for CABG consult. Heparin level 0.20 units/ml   Goal of Therapy:  Heparin level 0.3-0.7 units/ml Monitor platelets by anticoagulation protocol: Yes   Plan:  Increase heparin to 1300 units/hr Check heparin level 6-8 hours after rate change Daily heparin level and CBC  Thanks for allowing pharmacy to be a part of this patient's care.  Talbert Cage, PharmD Clinical Pharmacist

## 2020-06-17 NOTE — Progress Notes (Signed)
ANTICOAGULATION CONSULT NOTE  Pharmacy Consult for Heparin Indication: chest pain/ACS  Allergies  Allergen Reactions  . Cefaclor   . Codeine   . Penicillins   . Sulfa Antibiotics   . Trazodone And Nefazodone   . Vibramycin [Doxycycline Calcium]   . Erythromycin Rash    Patient Measurements: Height: 5\' 5"  (165.1 cm) Weight: 102.9 kg (226 lb 12.8 oz) IBW/kg (Calculated) : 57 Heparin Dosing Weight: 81.6kg  Vital Signs: Temp: 97.7 F (36.5 C) (11/25 1153) Temp Source: Oral (11/25 1153) BP: 88/51 (11/25 1258) Pulse Rate: 57 (11/25 1258)  Labs: Recent Labs    06/15/20 1921 06/15/20 2134 06/16/20 0123 06/16/20 0123 06/16/20 0232 06/16/20 0513 06/16/20 0513 06/16/20 0736 06/17/20 0031 06/17/20 0831 06/17/20 1713  HGB 13.4   < > 12.8   < > 12.7  --   --   --  12.7  --   --   HCT 40.6   < > 39.8  --  38.0  --   --   --  40.0  --   --   PLT 272   < > 272  --  266  --   --   --  275  --   --   APTT  --   --   --   --  56*  --   --   --   --   --   --   LABPROT  --   --   --   --  14.2  --   --   --   --   --   --   INR  --   --   --   --  1.1  --   --   --   --   --   --   HEPARINUNFRC  --   --   --   --   --  0.18*   < >  --  0.20* 0.33 0.44  CREATININE 1.08*  --   --   --  1.07*  --   --   --  1.10*  --   --   TROPONINIHS 1,069*   < > 9,657*   < > 10,270* 10,433*  --  9,038*  --   --   --    < > = values in this interval not displayed.    Estimated Creatinine Clearance: 54.2 mL/min (A) (by C-G formula based on SCr of 1.1 mg/dL (H)).   Medical History: Past Medical History:  Diagnosis Date  . Arthritis   . Asthma   . Diabetes mellitus without complication (HCC)   . Hypercholesteremia   . Hypertension   . Hypothyroidism     Assessment: 58 yof presented to the ED with CP and SOB. She is now s/p cath with multivessel CAD. Heparin was restarted 8 hours post sheath removal. Plan for CABG on 11/26.  Confirmatory heparin level is therapeutic; no bleeding  reported.  Goal of Therapy:  Heparin level 0.3-0.7 units/ml Monitor platelets by anticoagulation protocol: Yes   Plan:  Continue IV heparin at 1300 units/hr F/U AM labs  Nikayla Madaris D. 12/26, PharmD, BCPS, BCCCP 06/17/2020, 5:55 PM

## 2020-06-17 NOTE — Progress Notes (Signed)
Pre-CABG Dopplers completed. Refer to "CV Proc" under chart review to view preliminary results.  06/17/2020 2:43 PM Eula Fried., MHA, RVT, RDCS, RDMS

## 2020-06-18 ENCOUNTER — Inpatient Hospital Stay (HOSPITAL_COMMUNITY): Payer: Medicare HMO

## 2020-06-18 ENCOUNTER — Inpatient Hospital Stay (HOSPITAL_COMMUNITY): Payer: Medicare HMO | Admitting: Registered Nurse

## 2020-06-18 ENCOUNTER — Inpatient Hospital Stay (HOSPITAL_COMMUNITY)
Admission: EM | Disposition: A | Discharge status: Home Health | Payer: Self-pay | Source: Home / Self Care | Attending: Cardiothoracic Surgery

## 2020-06-18 DIAGNOSIS — I25110 Atherosclerotic heart disease of native coronary artery with unstable angina pectoris: Secondary | ICD-10-CM | POA: Diagnosis not present

## 2020-06-18 DIAGNOSIS — Z951 Presence of aortocoronary bypass graft: Secondary | ICD-10-CM

## 2020-06-18 DIAGNOSIS — I214 Non-ST elevation (NSTEMI) myocardial infarction: Secondary | ICD-10-CM | POA: Diagnosis not present

## 2020-06-18 HISTORY — PX: CORONARY ARTERY BYPASS GRAFT: SHX141

## 2020-06-18 HISTORY — PX: TEE WITHOUT CARDIOVERSION: SHX5443

## 2020-06-18 HISTORY — PX: RADIAL ARTERY HARVEST: SHX5067

## 2020-06-18 HISTORY — DX: Presence of aortocoronary bypass graft: Z95.1

## 2020-06-18 LAB — POCT I-STAT 7, (LYTES, BLD GAS, ICA,H+H)
Acid-Base Excess: 2 mmol/L (ref 0.0–2.0)
Acid-Base Excess: 0 mmol/L (ref 0.0–2.0)
Acid-base deficit: 3 mmol/L — ABNORMAL HIGH (ref 0.0–2.0)
Acid-base deficit: 4 mmol/L — ABNORMAL HIGH (ref 0.0–2.0)
Acid-base deficit: 5 mmol/L — ABNORMAL HIGH (ref 0.0–2.0)
Acid-base deficit: 5 mmol/L — ABNORMAL HIGH (ref 0.0–2.0)
Bicarbonate: 24 mmol/L (ref 20.0–28.0)
Bicarbonate: 26.4 mmol/L (ref 20.0–28.0)
Bicarbonate: 22.1 mmol/L (ref 20.0–28.0)
Bicarbonate: 21.7 mmol/L (ref 20.0–28.0)
Bicarbonate: 22.5 mmol/L (ref 20.0–28.0)
Bicarbonate: 21.8 mmol/L (ref 20.0–28.0)
Calcium, Ion: 0.96 mmol/L — ABNORMAL LOW (ref 1.15–1.40)
Calcium, Ion: 1.02 mmol/L — ABNORMAL LOW (ref 1.15–1.40)
Calcium, Ion: 1.22 mmol/L (ref 1.15–1.40)
Calcium, Ion: 1.22 mmol/L (ref 1.15–1.40)
Calcium, Ion: 1.23 mmol/L (ref 1.15–1.40)
Calcium, Ion: 1.22 mmol/L (ref 1.15–1.40)
HCT: 21 % — ABNORMAL LOW (ref 36.0–46.0)
HCT: 23 % — ABNORMAL LOW (ref 36.0–46.0)
HCT: 29 % — ABNORMAL LOW (ref 36.0–46.0)
HCT: 30 % — ABNORMAL LOW (ref 36.0–46.0)
HCT: 31 % — ABNORMAL LOW (ref 36.0–46.0)
HCT: 31 % — ABNORMAL LOW (ref 36.0–46.0)
Hemoglobin: 7.1 g/dL — ABNORMAL LOW (ref 12.0–15.0)
Hemoglobin: 7.8 g/dL — ABNORMAL LOW (ref 12.0–15.0)
Hemoglobin: 9.9 g/dL — ABNORMAL LOW (ref 12.0–15.0)
Hemoglobin: 10.2 g/dL — ABNORMAL LOW (ref 12.0–15.0)
Hemoglobin: 10.5 g/dL — ABNORMAL LOW (ref 12.0–15.0)
Hemoglobin: 10.5 g/dL — ABNORMAL LOW (ref 12.0–15.0)
O2 Saturation: 100 %
O2 Saturation: 100 %
O2 Saturation: 92 %
O2 Saturation: 94 %
O2 Saturation: 95 %
O2 Saturation: 96 %
Patient temperature: 35.5
Patient temperature: 37.4
Patient temperature: 37.5
Patient temperature: 37.1
Potassium: 3.5 mmol/L (ref 3.5–5.1)
Potassium: 4.7 mmol/L (ref 3.5–5.1)
Potassium: 4.1 mmol/L (ref 3.5–5.1)
Potassium: 4.4 mmol/L (ref 3.5–5.1)
Potassium: 4.5 mmol/L (ref 3.5–5.1)
Potassium: 4.4 mmol/L (ref 3.5–5.1)
Sodium: 137 mmol/L (ref 135–145)
Sodium: 137 mmol/L (ref 135–145)
Sodium: 141 mmol/L (ref 135–145)
Sodium: 141 mmol/L (ref 135–145)
Sodium: 141 mmol/L (ref 135–145)
Sodium: 141 mmol/L (ref 135–145)
TCO2: 25 mmol/L (ref 22–32)
TCO2: 28 mmol/L (ref 22–32)
TCO2: 23 mmol/L (ref 22–32)
TCO2: 23 mmol/L (ref 22–32)
TCO2: 24 mmol/L (ref 22–32)
TCO2: 23 mmol/L (ref 22–32)
pCO2 arterial: 34.1 mmHg (ref 32.0–48.0)
pCO2 arterial: 41.5 mmHg (ref 32.0–48.0)
pCO2 arterial: 37.8 mmHg (ref 32.0–48.0)
pCO2 arterial: 45.8 mmHg (ref 32.0–48.0)
pCO2 arterial: 47.5 mmHg (ref 32.0–48.0)
pCO2 arterial: 47.6 mmHg (ref 32.0–48.0)
pH, Arterial: 7.412 (ref 7.350–7.450)
pH, Arterial: 7.456 — ABNORMAL HIGH (ref 7.350–7.450)
pH, Arterial: 7.368 (ref 7.350–7.450)
pH, Arterial: 7.27 — ABNORMAL LOW (ref 7.350–7.450)
pH, Arterial: 7.302 — ABNORMAL LOW (ref 7.350–7.450)
pH, Arterial: 7.269 — ABNORMAL LOW (ref 7.350–7.450)
pO2, Arterial: 281 mmHg — ABNORMAL HIGH (ref 83.0–108.0)
pO2, Arterial: 385 mmHg — ABNORMAL HIGH (ref 83.0–108.0)
pO2, Arterial: 60 mmHg — ABNORMAL LOW (ref 83.0–108.0)
pO2, Arterial: 84 mmHg (ref 83.0–108.0)
pO2, Arterial: 86 mmHg (ref 83.0–108.0)
pO2, Arterial: 94 mmHg (ref 83.0–108.0)

## 2020-06-18 LAB — CBC
HCT: 33.1 % — ABNORMAL LOW (ref 36.0–46.0)
HCT: 33.3 % — ABNORMAL LOW (ref 36.0–46.0)
HCT: 32.1 % — ABNORMAL LOW (ref 36.0–46.0)
Hemoglobin: 10.8 g/dL — ABNORMAL LOW (ref 12.0–15.0)
Hemoglobin: 10.8 g/dL — ABNORMAL LOW (ref 12.0–15.0)
Hemoglobin: 10.4 g/dL — ABNORMAL LOW (ref 12.0–15.0)
MCH: 28.9 pg (ref 26.0–34.0)
MCH: 28.9 pg (ref 26.0–34.0)
MCH: 28.8 pg (ref 26.0–34.0)
MCHC: 32.6 g/dL (ref 30.0–36.0)
MCHC: 32.4 g/dL (ref 30.0–36.0)
MCHC: 32.4 g/dL (ref 30.0–36.0)
MCV: 88.5 fL (ref 80.0–100.0)
MCV: 89 fL (ref 80.0–100.0)
MCV: 88.9 fL (ref 80.0–100.0)
Platelets: 222 10*3/uL (ref 150–400)
Platelets: 149 10*3/uL — ABNORMAL LOW (ref 150–400)
Platelets: 185 10*3/uL (ref 150–400)
RBC: 3.74 MIL/uL — ABNORMAL LOW (ref 3.87–5.11)
RBC: 3.74 MIL/uL — ABNORMAL LOW (ref 3.87–5.11)
RBC: 3.61 MIL/uL — ABNORMAL LOW (ref 3.87–5.11)
RDW: 14.1 % (ref 11.5–15.5)
RDW: 13.8 % (ref 11.5–15.5)
RDW: 14 % (ref 11.5–15.5)
WBC: 11.4 10*3/uL — ABNORMAL HIGH (ref 4.0–10.5)
WBC: 14.9 10*3/uL — ABNORMAL HIGH (ref 4.0–10.5)
WBC: 13.5 10*3/uL — ABNORMAL HIGH (ref 4.0–10.5)
nRBC: 0 % (ref 0.0–0.2)
nRBC: 0 % (ref 0.0–0.2)
nRBC: 0 % (ref 0.0–0.2)

## 2020-06-18 LAB — BASIC METABOLIC PANEL
Anion gap: 13 (ref 5–15)
Anion gap: 9 (ref 5–15)
BUN: 26 mg/dL — ABNORMAL HIGH (ref 8–23)
BUN: 21 mg/dL (ref 8–23)
CO2: 23 mmol/L (ref 22–32)
CO2: 26 mmol/L (ref 22–32)
Calcium: 8.6 mg/dL — ABNORMAL LOW (ref 8.9–10.3)
Calcium: 8.1 mg/dL — ABNORMAL LOW (ref 8.9–10.3)
Chloride: 98 mmol/L (ref 98–111)
Chloride: 107 mmol/L (ref 98–111)
Creatinine, Ser: 1.64 mg/dL — ABNORMAL HIGH (ref 0.44–1.00)
Creatinine, Ser: 1.12 mg/dL — ABNORMAL HIGH (ref 0.44–1.00)
GFR, Estimated: 33 mL/min — ABNORMAL LOW (ref >60)
GFR, Estimated: 52 mL/min — ABNORMAL LOW (ref >60)
Glucose, Bld: 89 mg/dL (ref 70–99)
Glucose, Bld: 141 mg/dL — ABNORMAL HIGH (ref 70–99)
Potassium: 3.5 mmol/L (ref 3.5–5.1)
Potassium: 4.4 mmol/L (ref 3.5–5.1)
Sodium: 134 mmol/L — ABNORMAL LOW (ref 135–145)
Sodium: 142 mmol/L (ref 135–145)

## 2020-06-18 LAB — GLUCOSE, CAPILLARY
Glucose-Capillary: 128 mg/dL — ABNORMAL HIGH (ref 70–99)
Glucose-Capillary: 129 mg/dL — ABNORMAL HIGH (ref 70–99)
Glucose-Capillary: 132 mg/dL — ABNORMAL HIGH (ref 70–99)
Glucose-Capillary: 136 mg/dL — ABNORMAL HIGH (ref 70–99)
Glucose-Capillary: 140 mg/dL — ABNORMAL HIGH (ref 70–99)
Glucose-Capillary: 140 mg/dL — ABNORMAL HIGH (ref 70–99)
Glucose-Capillary: 141 mg/dL — ABNORMAL HIGH (ref 70–99)
Glucose-Capillary: 143 mg/dL — ABNORMAL HIGH (ref 70–99)
Glucose-Capillary: 155 mg/dL — ABNORMAL HIGH (ref 70–99)
Glucose-Capillary: 155 mg/dL — ABNORMAL HIGH (ref 70–99)
Glucose-Capillary: 288 mg/dL — ABNORMAL HIGH (ref 70–99)
Glucose-Capillary: 90 mg/dL (ref 70–99)

## 2020-06-18 LAB — POCT I-STAT, CHEM 8
BUN: 24 mg/dL — ABNORMAL HIGH (ref 8–23)
BUN: 23 mg/dL (ref 8–23)
BUN: 23 mg/dL (ref 8–23)
BUN: 21 mg/dL (ref 8–23)
BUN: 22 mg/dL (ref 8–23)
Calcium, Ion: 1.17 mmol/L (ref 1.15–1.40)
Calcium, Ion: 1.2 mmol/L (ref 1.15–1.40)
Calcium, Ion: 1.09 mmol/L — ABNORMAL LOW (ref 1.15–1.40)
Calcium, Ion: 0.94 mmol/L — ABNORMAL LOW (ref 1.15–1.40)
Calcium, Ion: 1.24 mmol/L (ref 1.15–1.40)
Chloride: 99 mmol/L (ref 98–111)
Chloride: 102 mmol/L (ref 98–111)
Chloride: 102 mmol/L (ref 98–111)
Chloride: 102 mmol/L (ref 98–111)
Chloride: 104 mmol/L (ref 98–111)
Creatinine, Ser: 1.3 mg/dL — ABNORMAL HIGH (ref 0.44–1.00)
Creatinine, Ser: 1.2 mg/dL — ABNORMAL HIGH (ref 0.44–1.00)
Creatinine, Ser: 1 mg/dL (ref 0.44–1.00)
Creatinine, Ser: 0.9 mg/dL (ref 0.44–1.00)
Creatinine, Ser: 0.9 mg/dL (ref 0.44–1.00)
Glucose, Bld: 152 mg/dL — ABNORMAL HIGH (ref 70–99)
Glucose, Bld: 133 mg/dL — ABNORMAL HIGH (ref 70–99)
Glucose, Bld: 122 mg/dL — ABNORMAL HIGH (ref 70–99)
Glucose, Bld: 127 mg/dL — ABNORMAL HIGH (ref 70–99)
Glucose, Bld: 128 mg/dL — ABNORMAL HIGH (ref 70–99)
HCT: 30 % — ABNORMAL LOW (ref 36.0–46.0)
HCT: 30 % — ABNORMAL LOW (ref 36.0–46.0)
HCT: 27 % — ABNORMAL LOW (ref 36.0–46.0)
HCT: 24 % — ABNORMAL LOW (ref 36.0–46.0)
HCT: 26 % — ABNORMAL LOW (ref 36.0–46.0)
Hemoglobin: 10.2 g/dL — ABNORMAL LOW (ref 12.0–15.0)
Hemoglobin: 10.2 g/dL — ABNORMAL LOW (ref 12.0–15.0)
Hemoglobin: 9.2 g/dL — ABNORMAL LOW (ref 12.0–15.0)
Hemoglobin: 8.2 g/dL — ABNORMAL LOW (ref 12.0–15.0)
Hemoglobin: 8.8 g/dL — ABNORMAL LOW (ref 12.0–15.0)
Potassium: 3.5 mmol/L (ref 3.5–5.1)
Potassium: 3.3 mmol/L — ABNORMAL LOW (ref 3.5–5.1)
Potassium: 4.4 mmol/L (ref 3.5–5.1)
Potassium: 4.4 mmol/L (ref 3.5–5.1)
Potassium: 5.2 mmol/L — ABNORMAL HIGH (ref 3.5–5.1)
Sodium: 135 mmol/L (ref 135–145)
Sodium: 137 mmol/L (ref 135–145)
Sodium: 136 mmol/L (ref 135–145)
Sodium: 138 mmol/L (ref 135–145)
Sodium: 139 mmol/L (ref 135–145)
TCO2: 21 mmol/L — ABNORMAL LOW (ref 22–32)
TCO2: 21 mmol/L — ABNORMAL LOW (ref 22–32)
TCO2: 24 mmol/L (ref 22–32)
TCO2: 22 mmol/L (ref 22–32)
TCO2: 24 mmol/L (ref 22–32)

## 2020-06-18 LAB — HEMOGLOBIN AND HEMATOCRIT, BLOOD
HCT: 25.2 % — ABNORMAL LOW (ref 36.0–46.0)
Hemoglobin: 8.3 g/dL — ABNORMAL LOW (ref 12.0–15.0)

## 2020-06-18 LAB — ECHO INTRAOPERATIVE TEE
Height: 65 in
Weight: 3678.4 oz

## 2020-06-18 LAB — PREPARE RBC (CROSSMATCH)

## 2020-06-18 LAB — PLATELET COUNT: Platelets: 135 10*3/uL — ABNORMAL LOW (ref 150–400)

## 2020-06-18 LAB — MAGNESIUM
Magnesium: 2.5 mg/dL — ABNORMAL HIGH (ref 1.7–2.4)
Magnesium: 3.3 mg/dL — ABNORMAL HIGH (ref 1.7–2.4)

## 2020-06-18 LAB — POCT I-STAT EG7
Acid-Base Excess: 1 mmol/L (ref 0.0–2.0)
Bicarbonate: 25.3 mmol/L (ref 20.0–28.0)
Calcium, Ion: 1.08 mmol/L — ABNORMAL LOW (ref 1.15–1.40)
HCT: 24 % — ABNORMAL LOW (ref 36.0–46.0)
Hemoglobin: 8.2 g/dL — ABNORMAL LOW (ref 12.0–15.0)
O2 Saturation: 81 %
Potassium: 4.5 mmol/L (ref 3.5–5.1)
Sodium: 137 mmol/L (ref 135–145)
TCO2: 27 mmol/L (ref 22–32)
pCO2, Ven: 39.2 mmHg — ABNORMAL LOW (ref 44.0–60.0)
pH, Ven: 7.418 (ref 7.250–7.430)
pO2, Ven: 45 mmHg (ref 32.0–45.0)

## 2020-06-18 LAB — HEPARIN LEVEL (UNFRACTIONATED): Heparin Unfractionated: 0.34 IU/mL (ref 0.30–0.70)

## 2020-06-18 LAB — APTT: aPTT: 32 seconds (ref 24–36)

## 2020-06-18 LAB — ABO/RH: ABO/RH(D): A POS

## 2020-06-18 LAB — PROTIME-INR
INR: 1.4 — ABNORMAL HIGH (ref 0.8–1.2)
Prothrombin Time: 17 seconds — ABNORMAL HIGH (ref 11.4–15.2)

## 2020-06-18 SURGERY — CORONARY ARTERY BYPASS GRAFTING (CABG)
Anesthesia: General | Site: Chest

## 2020-06-18 MED ORDER — LACTATED RINGERS IV SOLN: 500.0000 mL | Freq: Once | INTRAVENOUS | Status: DC | PRN

## 2020-06-18 MED ORDER — THROMBIN 5000 UNITS EX SOLR
INTRAVENOUS | Status: DC | PRN
  Administered 2020-06-18: 2 mL

## 2020-06-18 MED ORDER — MIDAZOLAM HCL 2 MG/2ML IJ SOLN: 2.0000 mg | INTRAMUSCULAR | Status: DC | PRN

## 2020-06-18 MED ORDER — BUPIVACAINE HCL (PF) 0.5 % IJ SOLN
INTRAMUSCULAR | Status: AC
  Filled 2020-06-18: qty 30

## 2020-06-18 MED ORDER — LACTATED RINGERS IV SOLN: INTRAVENOUS | Status: DC

## 2020-06-18 MED ORDER — SODIUM CHLORIDE 0.9 % IV SOLN: INTRAVENOUS | Status: DC | PRN

## 2020-06-18 MED ORDER — DEXTROSE 50 % IV SOLN: 0.0000 mL | INTRAVENOUS | Status: DC | PRN

## 2020-06-18 MED ORDER — SODIUM CHLORIDE 0.9% FLUSH
10.0000 mL | Freq: Two times a day (BID) | INTRAVENOUS | Status: DC
  Administered 2020-06-19 (×3): 10 mL

## 2020-06-18 MED ORDER — ORAL CARE MOUTH RINSE
15.0000 mL | Freq: Two times a day (BID) | OROMUCOSAL | Status: DC
  Administered 2020-06-19: 15 mL via OROMUCOSAL

## 2020-06-18 MED ORDER — PHENYLEPHRINE 40 MCG/ML (10ML) SYRINGE FOR IV PUSH (FOR BLOOD PRESSURE SUPPORT)
PREFILLED_SYRINGE | INTRAVENOUS | Status: AC
  Filled 2020-06-18: qty 10

## 2020-06-18 MED ORDER — PROTAMINE SULFATE 10 MG/ML IV SOLN
INTRAVENOUS | Status: DC | PRN
  Administered 2020-06-18: 280 mg via INTRAVENOUS

## 2020-06-18 MED ORDER — PLASMA-LYTE 148 IV SOLN
INTRAVENOUS | Status: DC | PRN
  Administered 2020-06-18: 500 mL via INTRAVASCULAR

## 2020-06-18 MED ORDER — MILRINONE LACTATE IN DEXTROSE 20-5 MG/100ML-% IV SOLN
0.3750 ug/kg/min | INTRAVENOUS | Status: DC
  Administered 2020-06-18 – 2020-06-19 (×3): 0.375 ug/kg/min via INTRAVENOUS
  Filled 2020-06-18 (×2): qty 100

## 2020-06-18 MED ORDER — BUPIVACAINE LIPOSOME 1.3 % IJ SUSP
20.0000 mL | Freq: Once | INTRAMUSCULAR | Status: DC
  Filled 2020-06-18: qty 20

## 2020-06-18 MED ORDER — EPINEPHRINE HCL 5 MG/250ML IV SOLN IN NS
0.5000 ug/min | INTRAVENOUS | Status: DC
  Administered 2020-06-18: 15:00:00 2 ug/min via INTRAVENOUS

## 2020-06-18 MED ORDER — METOPROLOL TARTRATE 25 MG/10 ML ORAL SUSPENSION
12.5000 mg | Freq: Two times a day (BID) | ORAL | Status: DC
  Filled 2020-06-18 (×3): qty 5

## 2020-06-18 MED ORDER — ASPIRIN EC 325 MG PO TBEC: 325.0000 mg | DELAYED_RELEASE_TABLET | Freq: Every day | ORAL | Status: DC

## 2020-06-18 MED ORDER — PLATELET POOR PLASMA OPTIME
Status: DC | PRN
  Administered 2020-06-18: 10 mL

## 2020-06-18 MED ORDER — ACETAMINOPHEN 500 MG PO TABS
1000.0000 mg | ORAL_TABLET | Freq: Four times a day (QID) | ORAL | Status: AC
  Administered 2020-06-18 – 2020-06-23 (×19): 1000 mg via ORAL
  Filled 2020-06-18 (×19): qty 2

## 2020-06-18 MED ORDER — POTASSIUM CHLORIDE 10 MEQ/50ML IV SOLN: 10.0000 meq | INTRAVENOUS | Status: AC

## 2020-06-18 MED ORDER — PLATELET RICH PLASMA OPTIME
Status: DC | PRN
  Administered 2020-06-18: 10 mL

## 2020-06-18 MED ORDER — SODIUM CHLORIDE 0.9 % IV SOLN: INTRAVENOUS | Status: DC

## 2020-06-18 MED ORDER — SODIUM CHLORIDE 0.45 % IV SOLN: INTRAVENOUS | Status: DC | PRN

## 2020-06-18 MED ORDER — ARTIFICIAL TEARS OPHTHALMIC OINT
TOPICAL_OINTMENT | OPHTHALMIC | Status: DC | PRN
  Administered 2020-06-18: 1 via OPHTHALMIC

## 2020-06-18 MED ORDER — FAMOTIDINE IN NACL 20-0.9 MG/50ML-% IV SOLN
INTRAVENOUS | Status: AC
  Administered 2020-06-18: 20 mg
  Filled 2020-06-18: qty 50

## 2020-06-18 MED ORDER — MIDAZOLAM HCL (PF) 10 MG/2ML IJ SOLN
INTRAMUSCULAR | Status: AC
  Filled 2020-06-18: qty 2

## 2020-06-18 MED ORDER — MORPHINE SULFATE (PF) 2 MG/ML IV SOLN
1.0000 mg | INTRAVENOUS | Status: DC | PRN
  Administered 2020-06-18: 1 mg via INTRAVENOUS
  Filled 2020-06-18: qty 1

## 2020-06-18 MED ORDER — ACETAMINOPHEN 650 MG RE SUPP
650.0000 mg | Freq: Once | RECTAL | Status: AC
  Administered 2020-06-18: 650 mg via RECTAL

## 2020-06-18 MED ORDER — BISACODYL 5 MG PO TBEC
10.0000 mg | DELAYED_RELEASE_TABLET | Freq: Every day | ORAL | Status: DC
  Administered 2020-06-19: 10 mg via ORAL
  Filled 2020-06-18 (×3): qty 2

## 2020-06-18 MED ORDER — STERILE WATER FOR INJECTION IJ SOLN
INTRAMUSCULAR | Status: AC
  Filled 2020-06-18: qty 10

## 2020-06-18 MED ORDER — DOCUSATE SODIUM 100 MG PO CAPS
200.0000 mg | ORAL_CAPSULE | Freq: Every day | ORAL | Status: DC
  Administered 2020-06-19: 200 mg via ORAL
  Filled 2020-06-18 (×4): qty 2

## 2020-06-18 MED ORDER — ALBUMIN HUMAN 5 % IV SOLN: INTRAVENOUS | Status: DC | PRN

## 2020-06-18 MED ORDER — OXYCODONE HCL 5 MG PO TABS
5.0000 mg | ORAL_TABLET | ORAL | Status: DC | PRN
  Administered 2020-06-20: 5 mg via ORAL
  Filled 2020-06-18 (×2): qty 1

## 2020-06-18 MED ORDER — TRANEXAMIC ACID 1000 MG/10ML IV SOLN
1.5000 mg/kg/h | INTRAVENOUS | Status: DC
  Filled 2020-06-18: qty 25

## 2020-06-18 MED ORDER — MIDAZOLAM HCL 5 MG/5ML IJ SOLN
INTRAMUSCULAR | Status: DC | PRN
  Administered 2020-06-18: 5 mg via INTRAVENOUS
  Administered 2020-06-18 (×2): 2 mg via INTRAVENOUS
  Administered 2020-06-18: 1 mg via INTRAVENOUS

## 2020-06-18 MED ORDER — METOPROLOL TARTRATE 5 MG/5ML IV SOLN
2.5000 mg | INTRAVENOUS | Status: DC | PRN
  Administered 2020-06-19: 5 mg via INTRAVENOUS
  Filled 2020-06-18: qty 5

## 2020-06-18 MED ORDER — DOPAMINE-DEXTROSE 3.2-5 MG/ML-% IV SOLN
0.0000 ug/kg/min | INTRAVENOUS | Status: DC
  Administered 2020-06-18: 3 ug/kg/min via INTRAVENOUS
  Filled 2020-06-18: qty 250

## 2020-06-18 MED ORDER — PROPOFOL 10 MG/ML IV BOLUS
INTRAVENOUS | Status: DC | PRN
  Administered 2020-06-18: 80 mg via INTRAVENOUS

## 2020-06-18 MED ORDER — INSULIN REGULAR(HUMAN) IN NACL 100-0.9 UT/100ML-% IV SOLN
INTRAVENOUS | Status: DC
  Administered 2020-06-18: 2.2 [IU]/h via INTRAVENOUS
  Administered 2020-06-19: 5.5 [IU]/h via INTRAVENOUS
  Administered 2020-06-19: 3 [IU]/h via INTRAVENOUS
  Filled 2020-06-18 (×2): qty 100

## 2020-06-18 MED ORDER — NITROGLYCERIN IN D5W 200-5 MCG/ML-% IV SOLN
0.0000 ug/min | INTRAVENOUS | Status: DC
  Administered 2020-06-18: 5 ug/min via INTRAVENOUS

## 2020-06-18 MED ORDER — PROPOFOL 10 MG/ML IV BOLUS
INTRAVENOUS | Status: AC
  Filled 2020-06-18: qty 20

## 2020-06-18 MED ORDER — VANCOMYCIN HCL 1000 MG IV SOLR
INTRAVENOUS | Status: AC
  Filled 2020-06-18: qty 3000

## 2020-06-18 MED ORDER — 0.9 % SODIUM CHLORIDE (POUR BTL) OPTIME
TOPICAL | Status: DC | PRN
  Administered 2020-06-18: 5000 mL

## 2020-06-18 MED ORDER — ARTIFICIAL TEARS OPHTHALMIC OINT
TOPICAL_OINTMENT | OPHTHALMIC | Status: AC
  Filled 2020-06-18: qty 3.5

## 2020-06-18 MED ORDER — EPHEDRINE 5 MG/ML INJ
INTRAVENOUS | Status: AC
  Filled 2020-06-18: qty 10

## 2020-06-18 MED ORDER — SODIUM CHLORIDE (PF) 0.9 % IJ SOLN
INTRAMUSCULAR | Status: AC
  Filled 2020-06-18: qty 20

## 2020-06-18 MED ORDER — FENTANYL CITRATE (PF) 250 MCG/5ML IJ SOLN
INTRAMUSCULAR | Status: AC
  Filled 2020-06-18: qty 30

## 2020-06-18 MED ORDER — LEVOTHYROXINE SODIUM 25 MCG PO TABS
137.0000 ug | ORAL_TABLET | Freq: Every day | ORAL | Status: DC
  Administered 2020-06-19 – 2020-06-25 (×7): 137 ug via ORAL
  Filled 2020-06-18 (×7): qty 1

## 2020-06-18 MED ORDER — SODIUM CHLORIDE 0.9% FLUSH
3.0000 mL | Freq: Two times a day (BID) | INTRAVENOUS | Status: DC
  Administered 2020-06-19 (×2): 3 mL via INTRAVENOUS

## 2020-06-18 MED ORDER — VANCOMYCIN HCL 1000 MG IV SOLR
INTRAVENOUS | Status: DC | PRN
  Administered 2020-06-18: 3 g via TOPICAL

## 2020-06-18 MED ORDER — PLASMA-LYTE A IV SOLN: INTRAVENOUS | Status: DC

## 2020-06-18 MED ORDER — BUPIVACAINE LIPOSOME 1.3 % IJ SUSP
INTRAMUSCULAR | Status: DC | PRN
  Administered 2020-06-18: 50 mL

## 2020-06-18 MED ORDER — FAMOTIDINE IN NACL 20-0.9 MG/50ML-% IV SOLN
20.0000 mg | Freq: Two times a day (BID) | INTRAVENOUS | Status: DC
  Administered 2020-06-18 – 2020-06-20 (×4): 20 mg via INTRAVENOUS
  Filled 2020-06-18 (×5): qty 50

## 2020-06-18 MED ORDER — HEMOSTATIC AGENTS (NO CHARGE) OPTIME
TOPICAL | Status: DC | PRN
  Administered 2020-06-18 (×2): 1 via TOPICAL

## 2020-06-18 MED ORDER — FENTANYL CITRATE (PF) 250 MCG/5ML IJ SOLN
INTRAMUSCULAR | Status: DC | PRN
  Administered 2020-06-18 (×2): 100 ug via INTRAVENOUS
  Administered 2020-06-18: 150 ug via INTRAVENOUS
  Administered 2020-06-18: 100 ug via INTRAVENOUS
  Administered 2020-06-18: 250 ug via INTRAVENOUS
  Administered 2020-06-18: 100 ug via INTRAVENOUS
  Administered 2020-06-18: 50 ug via INTRAVENOUS
  Administered 2020-06-18 (×4): 100 ug via INTRAVENOUS
  Administered 2020-06-18: 250 ug via INTRAVENOUS

## 2020-06-18 MED ORDER — CHLORHEXIDINE GLUCONATE CLOTH 2 % EX PADS
6.0000 | MEDICATED_PAD | Freq: Every day | CUTANEOUS | Status: DC
  Administered 2020-06-18 – 2020-06-21 (×3): 6 via TOPICAL

## 2020-06-18 MED ORDER — LACTATED RINGERS IV SOLN: INTRAVENOUS | Status: DC | PRN

## 2020-06-18 MED ORDER — ROCURONIUM BROMIDE 10 MG/ML (PF) SYRINGE
PREFILLED_SYRINGE | INTRAVENOUS | Status: DC | PRN
  Administered 2020-06-18: 70 mg via INTRAVENOUS
  Administered 2020-06-18: 30 mg via INTRAVENOUS
  Administered 2020-06-18: 100 mg via INTRAVENOUS

## 2020-06-18 MED ORDER — VANCOMYCIN HCL IN DEXTROSE 1-5 GM/200ML-% IV SOLN
1000.0000 mg | Freq: Once | INTRAVENOUS | Status: AC
  Administered 2020-06-18: 1000 mg via INTRAVENOUS
  Filled 2020-06-18: qty 200

## 2020-06-18 MED ORDER — STERILE WATER FOR INJECTION IJ SOLN
INTRAMUSCULAR | Status: DC | PRN
  Administered 2020-06-18: 10 mL

## 2020-06-18 MED ORDER — ACETAMINOPHEN 160 MG/5ML PO SOLN: 650.0000 mg | Freq: Once | ORAL | Status: AC

## 2020-06-18 MED ORDER — BISACODYL 10 MG RE SUPP: 10.0000 mg | Freq: Every day | RECTAL | Status: DC

## 2020-06-18 MED ORDER — MAGNESIUM SULFATE 4 GM/100ML IV SOLN
4.0000 g | Freq: Once | INTRAVENOUS | Status: AC
  Administered 2020-06-18: 4 g via INTRAVENOUS
  Filled 2020-06-18: qty 100

## 2020-06-18 MED ORDER — SODIUM BICARBONATE 8.4 % IV SOLN
100.0000 meq | Freq: Once | INTRAVENOUS | Status: AC
  Administered 2020-06-18: 100 meq via INTRAVENOUS

## 2020-06-18 MED ORDER — ROCURONIUM BROMIDE 10 MG/ML (PF) SYRINGE
PREFILLED_SYRINGE | INTRAVENOUS | Status: AC
  Filled 2020-06-18: qty 10

## 2020-06-18 MED ORDER — SODIUM CHLORIDE 0.9% FLUSH: 10.0000 mL | INTRAVENOUS | Status: DC | PRN

## 2020-06-18 MED ORDER — PHENYLEPHRINE HCL-NACL 20-0.9 MG/250ML-% IV SOLN
0.0000 ug/min | INTRAVENOUS | Status: DC
  Administered 2020-06-18: 10 ug/min via INTRAVENOUS

## 2020-06-18 MED ORDER — DEXMEDETOMIDINE HCL IN NACL 400 MCG/100ML IV SOLN
0.0000 ug/kg/h | INTRAVENOUS | Status: DC
  Administered 2020-06-18: 0.7 ug/kg/h via INTRAVENOUS

## 2020-06-18 MED ORDER — SODIUM CHLORIDE 0.9 % IV SOLN: 250.0000 mL | INTRAVENOUS | Status: DC

## 2020-06-18 MED ORDER — ALBUMIN HUMAN 5 % IV SOLN
250.0000 mL | INTRAVENOUS | Status: AC | PRN
  Administered 2020-06-18 (×2): 12.5 g via INTRAVENOUS

## 2020-06-18 MED ORDER — ACETAMINOPHEN 160 MG/5ML PO SOLN: 1000.0000 mg | Freq: Four times a day (QID) | ORAL | Status: AC

## 2020-06-18 MED ORDER — SODIUM CHLORIDE 0.9 % IV SOLN: 10.0000 mL/h | Freq: Once | INTRAVENOUS | Status: DC

## 2020-06-18 MED ORDER — DEXMEDETOMIDINE HCL IN NACL 400 MCG/100ML IV SOLN
0.1000 ug/kg/h | INTRAVENOUS | Status: DC
  Filled 2020-06-18: qty 100

## 2020-06-18 MED ORDER — HEPARIN SODIUM (PORCINE) 1000 UNIT/ML IJ SOLN
INTRAMUSCULAR | Status: DC | PRN
  Administered 2020-06-18: 31000 [IU] via INTRAVENOUS

## 2020-06-18 MED ORDER — LEVOFLOXACIN IN D5W 750 MG/150ML IV SOLN
750.0000 mg | INTRAVENOUS | Status: AC
  Administered 2020-06-19: 750 mg via INTRAVENOUS
  Filled 2020-06-18: qty 150

## 2020-06-18 MED ORDER — ASPIRIN 81 MG PO CHEW: 324.0000 mg | CHEWABLE_TABLET | Freq: Every day | ORAL | Status: DC

## 2020-06-18 MED ORDER — METOPROLOL TARTRATE 12.5 MG HALF TABLET
12.5000 mg | ORAL_TABLET | Freq: Two times a day (BID) | ORAL | Status: DC
  Administered 2020-06-19 – 2020-06-21 (×5): 12.5 mg via ORAL
  Filled 2020-06-18 (×5): qty 1

## 2020-06-18 MED ORDER — CHLORHEXIDINE GLUCONATE 0.12 % MT SOLN
15.0000 mL | OROMUCOSAL | Status: AC
  Administered 2020-06-18: 15 mL via OROMUCOSAL

## 2020-06-18 MED ORDER — SODIUM CHLORIDE 0.9% FLUSH: 3.0000 mL | INTRAVENOUS | Status: DC | PRN

## 2020-06-18 SURGICAL SUPPLY — 98 items
ADAPTER CARDIO PERF ANTE/RETRO (ADAPTER) ×4 IMPLANT
APPLICATOR TIP COSEAL (VASCULAR PRODUCTS) ×4 IMPLANT
APPLIER CLIP 9.375 SM OPEN (CLIP) ×4
BAG DECANTER FOR FLEXI CONT (MISCELLANEOUS) ×4 IMPLANT
BLADE CLIPPER SURG (BLADE) IMPLANT
BLADE STERNUM SYSTEM 6 (BLADE) ×4 IMPLANT
BLADE SURG 15 STRL LF DISP TIS (BLADE) ×3 IMPLANT
BLADE SURG 15 STRL SS (BLADE) ×1
BNDG ELASTIC 4X5.8 VLCR STR LF (GAUZE/BANDAGES/DRESSINGS) ×8 IMPLANT
BNDG ELASTIC 6X5.8 VLCR STR LF (GAUZE/BANDAGES/DRESSINGS) ×4 IMPLANT
BNDG GAUZE ELAST 4 BULKY (GAUZE/BANDAGES/DRESSINGS) ×8 IMPLANT
CANISTER SUCT 3000ML PPV (MISCELLANEOUS) ×4 IMPLANT
CANISTER WOUND CARE 500ML ATS (WOUND CARE) ×4 IMPLANT
CANNULA GUNDRY RCSP 15FR (MISCELLANEOUS) IMPLANT
CATH CPB KIT HENDRICKSON (MISCELLANEOUS) ×4 IMPLANT
CATH RETROPLEGIA CORONARY 14FR (CATHETERS) ×4 IMPLANT
CATH ROBINSON RED A/P 18FR (CATHETERS) ×12 IMPLANT
CLIP APPLIE 9.375 SM OPEN (CLIP) ×3 IMPLANT
CLIP FOGARTY SPRING 6M (CLIP) ×4 IMPLANT
CLIP RETRACTION 3.0MM CORONARY (MISCELLANEOUS) ×8 IMPLANT
CONN ST 1/4X3/8  BEN (MISCELLANEOUS) ×2
CONN ST 1/4X3/8 BEN (MISCELLANEOUS) ×6 IMPLANT
COVER MAYO STAND STRL (DRAPES) ×4 IMPLANT
DERMABOND ADVANCED (GAUZE/BANDAGES/DRESSINGS) ×2
DERMABOND ADVANCED .7 DNX12 (GAUZE/BANDAGES/DRESSINGS) ×6 IMPLANT
DRAIN CHANNEL 28F RND 3/8 FF (WOUND CARE) ×12 IMPLANT
DRAPE CARDIOVASCULAR INCISE (DRAPES) ×1
DRAPE EXTREMITY T 121X128X90 (DISPOSABLE) ×4 IMPLANT
DRAPE HALF SHEET 40X57 (DRAPES) ×4 IMPLANT
DRAPE SLUSH/WARMER DISC (DRAPES) ×4 IMPLANT
DRAPE SRG 135X102X78XABS (DRAPES) ×3 IMPLANT
DRESSING PEEL AND PLAC PRVNA20 (GAUZE/BANDAGES/DRESSINGS) ×3 IMPLANT
DRSG PEEL AND PLACE PREVENA 20 (GAUZE/BANDAGES/DRESSINGS) ×4
ELECT CAUTERY BLADE 6.4 (BLADE) ×4 IMPLANT
ELECT REM PT RETURN 9FT ADLT (ELECTROSURGICAL) ×8
ELECTRODE REM PT RTRN 9FT ADLT (ELECTROSURGICAL) ×6 IMPLANT
FELT TEFLON 1X6 (MISCELLANEOUS) ×4 IMPLANT
GAUZE SPONGE 4X4 12PLY STRL (GAUZE/BANDAGES/DRESSINGS) ×8 IMPLANT
GLOVE BIO SURGEON STRL SZ 6.5 (GLOVE) ×16 IMPLANT
GLOVE BIO SURGEON STRL SZ7 (GLOVE) ×4 IMPLANT
GLOVE BIO SURGEON STRL SZ8 (GLOVE) ×4 IMPLANT
GLOVE BIOGEL PI IND STRL 8.5 (GLOVE) ×3 IMPLANT
GLOVE BIOGEL PI INDICATOR 8.5 (GLOVE) ×1
GLOVE NEODERM STRL 7.5 LF PF (GLOVE) ×9 IMPLANT
GLOVE SURG NEODERM 7.5  LF PF (GLOVE) ×3
GLOVE SURG SS PI 7.5 STRL IVOR (GLOVE) ×8 IMPLANT
GOWN STRL REUS W/ TWL LRG LVL3 (GOWN DISPOSABLE) ×12 IMPLANT
GOWN STRL REUS W/TWL LRG LVL3 (GOWN DISPOSABLE) ×4
INSERT SUTURE HOLDER (MISCELLANEOUS) ×4 IMPLANT
KIT APPLICATOR RATIO 11:1 (KITS) ×4 IMPLANT
KIT BASIN OR (CUSTOM PROCEDURE TRAY) ×4 IMPLANT
KIT SUCTION CATH 14FR (SUCTIONS) ×4 IMPLANT
KIT TURNOVER KIT B (KITS) ×4 IMPLANT
KIT VASOVIEW HEMOPRO 2 VH 4000 (KITS) ×4 IMPLANT
NEEDLE 18GX1X1/2 (RX/OR ONLY) (NEEDLE) ×4 IMPLANT
NS IRRIG 1000ML POUR BTL (IV SOLUTION) ×20 IMPLANT
PACK E OPEN HEART (SUTURE) ×4 IMPLANT
PACK OPEN HEART (CUSTOM PROCEDURE TRAY) ×4 IMPLANT
PACK PLATELET PROCEDURE 60 (MISCELLANEOUS) ×4 IMPLANT
PACK SPY-PHI (KITS) ×4 IMPLANT
PAD ARMBOARD 7.5X6 YLW CONV (MISCELLANEOUS) ×8 IMPLANT
PAD ELECT DEFIB RADIOL ZOLL (MISCELLANEOUS) ×4 IMPLANT
PENCIL BUTTON HOLSTER BLD 10FT (ELECTRODE) ×4 IMPLANT
POSITIONER HEAD DONUT 9IN (MISCELLANEOUS) ×4 IMPLANT
POWDER SURGICEL 3.0 GRAM (HEMOSTASIS) ×4 IMPLANT
PUNCH AORTIC ROTATE 4.5MM 8IN (MISCELLANEOUS) ×4 IMPLANT
SEALANT SURG COSEAL 8ML (VASCULAR PRODUCTS) ×4 IMPLANT
SET CARDIOPLEGIA MPS 5001102 (MISCELLANEOUS) ×4 IMPLANT
SHEARS HARMONIC 9CM CVD (BLADE) ×4 IMPLANT
STAPLER VISISTAT 35W (STAPLE) ×4 IMPLANT
SUPPORT HEART JANKE-BARRON (MISCELLANEOUS) ×4 IMPLANT
SUT BONE WAX W31G (SUTURE) ×4 IMPLANT
SUT MNCRL AB 3-0 PS2 18 (SUTURE) ×8 IMPLANT
SUT PDS AB 1 CTX 36 (SUTURE) ×8 IMPLANT
SUT PROLENE 3 0 SH DA (SUTURE) ×4 IMPLANT
SUT PROLENE 4 0 RB 1 (SUTURE) ×1
SUT PROLENE 4 0 SH DA (SUTURE) ×8 IMPLANT
SUT PROLENE 4-0 RB1 .5 CRCL 36 (SUTURE) ×3 IMPLANT
SUT PROLENE 6 0 C 1 30 (SUTURE) ×16 IMPLANT
SUT PROLENE 7 0 BV1 MDA (SUTURE) ×4 IMPLANT
SUT PROLENE BLUE 7 0 (SUTURE) ×4 IMPLANT
SUT SILK  1 MH (SUTURE) ×1
SUT SILK 1 MH (SUTURE) ×3 IMPLANT
SUT STEEL 6MS V (SUTURE) ×4 IMPLANT
SUT STEEL SZ 6 DBL 3X14 BALL (SUTURE) ×4 IMPLANT
SUT VIC AB 2-0 CT1 27 (SUTURE) ×2
SUT VIC AB 2-0 CT1 TAPERPNT 27 (SUTURE) ×6 IMPLANT
SUT VIC AB 3-0 X1 27 (SUTURE) ×4 IMPLANT
SYR 3ML LL SCALE MARK (SYRINGE) ×4 IMPLANT
SYSTEM SAHARA CHEST DRAIN ATS (WOUND CARE) ×4 IMPLANT
TAPE CLOTH SURG 4X10 WHT LF (GAUZE/BANDAGES/DRESSINGS) ×4 IMPLANT
TIP DUAL SPRAY TOPICAL (TIP) ×8 IMPLANT
TOWEL GREEN STERILE (TOWEL DISPOSABLE) ×4 IMPLANT
TOWEL GREEN STERILE FF (TOWEL DISPOSABLE) ×4 IMPLANT
TRAY FOLEY SLVR 14FR TEMP STAT (SET/KITS/TRAYS/PACK) ×4 IMPLANT
TUBING LAP HI FLOW INSUFFLATIO (TUBING) ×4 IMPLANT
UNDERPAD 30X36 HEAVY ABSORB (UNDERPADS AND DIAPERS) ×4 IMPLANT
WATER STERILE IRR 1000ML POUR (IV SOLUTION) ×8 IMPLANT

## 2020-06-18 NOTE — Anesthesia Procedure Notes (Signed)
Procedure Name: Intubation Date/Time: 06/18/2020 7:53 AM Performed by: Rosiland Oz, CRNA Pre-anesthesia Checklist: Patient identified, Emergency Drugs available, Suction available, Patient being monitored and Timeout performed Patient Re-evaluated:Patient Re-evaluated prior to induction Oxygen Delivery Method: Circle system utilized Preoxygenation: Pre-oxygenation with 100% oxygen Induction Type: IV induction Ventilation: Mask ventilation without difficulty Laryngoscope Size: Miller and 2 Grade View: Grade I Tube type: Oral Tube size: 7.5 mm Number of attempts: 1 Airway Equipment and Method: Stylet Placement Confirmation: ETT inserted through vocal cords under direct vision,  positive ETCO2 and breath sounds checked- equal and bilateral Secured at: 22 cm Tube secured with: Tape Dental Injury: Teeth and Oropharynx as per pre-operative assessment

## 2020-06-18 NOTE — Brief Op Note (Addendum)
06/15/2020 - 06/18/2020  12:25 PM  PATIENT:  Angela Frazier  74 y.o. female  PRE-OPERATIVE DIAGNOSIS:  Coronary Artery Disease  POST-OPERATIVE DIAGNOSIS:  Coronary Artery Disease  PROCEDURE:  Procedure(s): CORONARY ARTERY BYPASS GRAFTING (CABG), ON PUMP, TIMES FOUR, USING LEFT INTERNAL MAMMARY ARTERY, ENDOSCOPICALLY HARVESTED RIGHT GREATER SAPHENOUS VEIN, AND LEFT RADIAL ARTERY (OPEN HARVEST) (N/A) RADIAL ARTERY HARVEST (Left) TRANSESOPHAGEAL ECHOCARDIOGRAM (TEE) (N/A) INDOCYANINE GREEN FLUORESCENCE IMAGING (ICG) (N/A)   LIMA->LAD LRA->PL SVG->PAD SVG->OM  Vein harvest time:  Prep time:  SURGEON: Linden Dolin, MD   PHYSICIAN ASSISTANT: Roddenberry  ANESTHESIA:   general  EBL:  Per anesthesia and perfusion records   BLOOD ADMINISTERED:none  DRAINS: Mediastinal and left pleural drains   LOCAL MEDICATIONS USED: left intercostal Exparel  SPECIMEN:  No Specimen  DISPOSITION OF SPECIMEN:  N/A  COUNTS:  YES  DICTATION: .Dragon Dictation  PLAN OF CARE: Admit to inpatient   PATIENT DISPOSITION:  ICU - intubated and hemodynamically stable.   Delay start of Pharmacological VTE agent (>24hrs) due to surgical blood loss or risk of bleeding: yes  Agree with documentation. Maitri Schnoebelen Z. Vickey Sages, MD [phone]

## 2020-06-18 NOTE — Hospital Course (Addendum)
Admitting Diagnoses:  Acute non-ST elevation myocardial infarction Multivessel coronary artery disease History of hypertension Dyslipidemia Type 2 diabetes mellitus History of obstructive sleep apnea and asthma Obesity Prolonged QT interval  Discharge Diagnoses:  Acute non-ST elevation myocardial infarction Multivessel coronary artery disease History of hypertension Dyslipidemia Type 2 diabetes mellitus History of obstructive sleep apnea and asthma Obesity Prolonged QT interval   History of Present Illness:       74 yo lady with HTN, HL, DM and OSA experienced sudden, relatively mild chest pain but it was severe and persistent enough to get her attention. Called 911 who suggested ED visit. There, NSTEMI dx'd. 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 Consult for CABG. Stable sx on heparin. Denies CP or SOB.  Postoperative Hospital Course:  Angela Frazier remained stable following left heart catheterization.  She elected to proceed with surgery.  She was taken to the operating room on 06/18/2020 where CABG x4 was carried out.  Please see the operative note for details.  Following the procedure, she separated from cardiopulmonary bypass without difficulty on milrinone and epinephrine infusions.  She was transferred to the ICU in stable condition.  She was extubated the day of surgery.  She was weaned off Epinephrine, Neo-synephrine and Milrinone as hemodynamics allowed.  Her chest tubes and arterial lines were removed without difficulty on 06/19/2020.  She is a poorly controlled diabetic.  Her preoperative A1c was 9.1.  She was weaned off insulin drip as blood sugar control was obtained.  Diabetes education/coordinator was also consulted.  She was treated with IV Amiodarone for Atrial Fibrillation.  She converted to NSR and was transitioned to an oral regimen of Amiodarone.  She was medically stable for transfer to the progressive care unit on  06/20/2020. On the floor, she continued to progress. Her only issue was shortness of breath however, all her inhalers were stopped due to prolonged QT interval. She was weaned off oxygen with good saturations. She remained in NSR. She was started on BID IV lasix for fluid overload. We continued to watch her renal function closely. She remained in sinus bradycardia but she was tolerating well and was hemodynamically stable. I ordered home PT/OT for her to continue to work on strength and conditioning at home. She will be staying with her son on discharge who works from home.    Treatments:  CARDIOTHORACIC SURGERY OPERATIVE NOTE   Date of Procedure:    06/18/2020   Preoperative Diagnosis:      Severe 3-vessel Coronary Artery Disease, s/p NSTEMI   Postoperative Diagnosis:    Same   Procedure:        Coronary Artery Bypass Grafting x 4              Left Internal Mammary Artery to Distal Left Anterior Descending Coronary Artery; Saphenous Vein Graft to Posterior Descending Coronary Artery; Saphenous Vein Graft to 2nd Obtuse Marginal Branch of Left Circumflex Coronary Artery; left radial artery graft to right Posterolateral Coronary Artery; Endoscopic Vein Harvest from right thigh  Open left radial artery harvesting Completion graft surveillance with indocyanine green fluorescence imaging (SPY)   Surgeon:        B. Micheline Maze, MD   Assistant:       Gaynelle Arabian, PA-C   Anesthesia:    get   Operative Findings: Mildly reduced left ventricular systolic function good quality left internal mammary artery conduit good quality saphenous vein conduit and radial artery  conduit good quality target vessels for grafting       BRIEF CLINICAL NOTE AND INDICATIONS FOR SURGERY   74 yo lady presented with NSTEMI; LHC showed severe multivessel CAD. Referred for CABG. She has been thoroughly evaluated and is considered a good candidate for surgery.

## 2020-06-18 NOTE — Anesthesia Procedure Notes (Signed)
Central Venous Catheter Insertion Performed by: Achille Rich, MD, anesthesiologist Start/End11/26/2021 7:16 AM, 06/18/2020 7:19 AM Patient location: Pre-op. Preanesthetic checklist: patient identified, IV checked, site marked, risks and benefits discussed, surgical consent, monitors and equipment checked, pre-op evaluation, timeout performed and anesthesia consent Position: Trendelenburg Patient sedated Hand hygiene performed  and maximum sterile barriers used  PA cath was placed.Swan type:thermodilution Procedure performed without using ultrasound guided technique. Attempts: 1 Patient tolerated the procedure well with no immediate complications.

## 2020-06-18 NOTE — Anesthesia Procedure Notes (Signed)
Central Venous Catheter Insertion Performed by: Albertha Ghee, MD, anesthesiologist Start/End11/26/2021 7:06 AM, 06/18/2020 7:16 AM Patient location: Pre-op. Preanesthetic checklist: patient identified, IV checked, site marked, risks and benefits discussed, surgical consent, monitors and equipment checked, pre-op evaluation, timeout performed and anesthesia consent Lidocaine 1% used for infiltration and patient sedated Hand hygiene performed  and maximum sterile barriers used  Catheter size: 9 Fr Central line was placed.MAC introducer Procedure performed using ultrasound guided technique. Ultrasound Notes:anatomy identified, needle tip was noted to be adjacent to the nerve/plexus identified, no ultrasound evidence of intravascular and/or intraneural injection and image(s) printed for medical record Attempts: 1 Following insertion, line sutured and dressing applied. Post procedure assessment: blood return through all ports, free fluid flow and no air  Patient tolerated the procedure well with no immediate complications.

## 2020-06-18 NOTE — Procedures (Signed)
Extubation Procedure Note  Patient Details:   Name: Angela Frazier DOB: October 22, 1945 MRN: [id number]   Airway Documentation:    Vent end date: (not recorded) Vent end time: (not recorded)   Evaluation  O2 sats: stable throughout Complications: No apparent complications Patient did tolerate procedure well. Bilateral Breath Sounds: Clear, Diminished   Yes, patient able to speak.  Patient extubated to 4L nasal cannula at 1712 per rapid wean protocol. RN at bedside. Vitals stable. RT will continue to monitor.  Farris Has 06/18/2020, 5:15 PM

## 2020-06-18 NOTE — H&P (Signed)
History and Physical Interval Note:  06/18/2020 7:24 AM  Angela Frazier  has presented today for surgery, with the diagnosis of CAD.  The various methods of treatment have been discussed with the patient and family. After consideration of risks, benefits and other options for treatment, the patient has consented to  Procedure(s) with comments: CORONARY ARTERY BYPASS GRAFTING (CABG) (N/A) - POSSIBILE BIMA possible RADIAL ARTERY HARVEST (Left) TRANSESOPHAGEAL ECHOCARDIOGRAM (TEE) (N/A) INDOCYANINE GREEN FLUORESCENCE IMAGING (ICG) (N/A) as a surgical intervention.  The patient's history has been reviewed, patient examined, no change in status, stable for surgery.  I have reviewed the patient's chart and labs.  Questions were answered to the patient's satisfaction.     Linden Dolin

## 2020-06-18 NOTE — Op Note (Signed)
CARDIOTHORACIC SURGERY OPERATIVE NOTE  Date of Procedure: 06/18/2020  Preoperative Diagnosis: Severe 3-vessel Coronary Artery Disease, s/p NSTEMI  Postoperative Diagnosis: Same  Procedure:    Coronary Artery Bypass Grafting x 4   Left Internal Mammary Artery to Distal Left Anterior Descending Coronary Artery; Saphenous Vein Graft to Posterior Descending Coronary Artery; Saphenous Vein Graft to 2nd Obtuse Marginal Branch of Left Circumflex Coronary Artery; left radial artery graft to right Posterolateral Coronary Artery; Endoscopic Vein Harvest from right thigh  Open left radial artery harvesting Completion graft surveillance with indocyanine green fluorescence imaging (SPY)  Surgeon: B. Micheline Maze, MD  Assistant: Gaynelle Arabian, PA-C  Anesthesia: get  Operative Findings:  Mildly reduced left ventricular systolic function  good quality left internal mammary artery conduit  good quality saphenous vein conduit and radial artery conduit  good quality target vessels for grafting    BRIEF CLINICAL NOTE AND INDICATIONS FOR SURGERY  74 yo lady presented with NSTEMI; LHC showed severe multivessel CAD. Referred for CABG. She has been thoroughly evaluated and is considered a good candidate for surgery.    DETAILS OF THE OPERATIVE PROCEDURE  Preparation:  The patient is brought to the operating room on the above mentioned date and central monitoring was established by the anesthesia team including placement of Swan-Ganz catheter and radial arterial line. The patient is placed in the supine position on the operating table.  Intravenous antibiotics are administered. General endotracheal anesthesia is induced uneventfully. A Foley catheter is placed.  Baseline transesophageal echocardiogram was performed.  Findings were notable for mildly reduced LV function  The patient's chest, abdomen, left upper extremity,  both groins, and both lower extremities are prepared and draped in a  sterile manner. A time out procedure is performed.   Surgical Approach and Conduit Harvest:  Attention is 1st turned to open left radial artery harvesting; the radial artery is mobilized and explanted using harmonic scalpel technique. The forearm incision is closed in layers, and the left arm is tucked at the side. Next, median sternotomy incision was performed and the left internal mammary artery is dissected from the chest wall and prepared for bypass grafting. The left internal mammary artery is notably good quality conduit. Simultaneously, the greater saphenous vein is obtained from the patient's right thigh using endoscopic vein harvest technique. The saphenous vein is notably good quality conduit. After removal of the saphenous vein, the small surgical incisions in the lower extremity are closed with absorbable suture. Following systemic heparinization, the left internal mammary artery was transected distally noted to have excellent flow.   Extracorporeal Cardiopulmonary Bypass and Myocardial Protection:  The pericardium is opened. The ascending aorta is normal in appearance. The ascending aorta and the right atrium are cannulated for cardiopulmonary bypass.  Adequate heparinization is verified.    A retrograde cardioplegia cannula is placed through the right atrium into the coronary sinus.  The entire pre-bypass portion of the operation was notable for stable hemodynamics.  Cardiopulmonary bypass was begun and the surface of the heart is inspected. Distal target vessels are selected for coronary artery bypass grafting. A cardioplegia cannula is placed in the ascending aorta.  A temperature probe was placed in the interventricular septum.  The patient is allowed to cool passively to 34C systemic temperature.  The aortic cross clamp is applied and cold blood cardioplegia is delivered initially in an antegrade fashion through the aortic root.  Supplemental cardioplegia is given retrograde  through the coronary sinus catheter.  Iced saline slush is applied for  topical hypothermia.  The initial cardioplegic arrest is rapid with early diastolic arrest.  Repeat doses of cardioplegia are administered intermittently throughout the entire cross clamp portion of the operation through the aortic root, through the coronary sinus catheter, and through subsequently placed vein grafts in order to maintain completely flat electrocardiogram.   Coronary Artery Bypass Grafting:   The  posterior descending branch of the right coronary artery was grafted using a reversed saphenous vein graft in an end-to-side fashion.  At the site of distal anastomosis the target vessel was good quality and measured approximately 1.5 mm in diameter.  The 2nd obtuse marginal branch of the left circumflex coronary artery was grafted using a reversed saphenous vein graft in an end-to-side fashion.  At the site of distal anastomosis the target vessel was good quality and measured approximately 2 mm in diameter.  The posterolateral branch of the right coronary artery was grafted using the left radial artery graft in an end-to-side fashion.  At the site of distal anastomosis the target vessel was good quality and measured approximately 1.5 mm in diameter.   The distal left anterior coronary artery was grafted with the left internal mammary artery in an end-to-side fashion.  At the site of distal anastomosis the target vessel was good quality and measured approximately 1.5 mm in diameter. Anastomotic patency and runoff was confirmed with indocyanine green fluorescence imaging (SPY).  All proximal vein graft anastomoses were placed directly to the ascending aorta prior to removal of the aortic cross clamp.  Deairing procedures were performed, and the aortic cross clamp was removed.    Procedure Completion:  All proximal and distal coronary anastomoses were inspected for hemostasis and appropriate graft orientation.  Epicardial pacing wires are fixed to the right ventricular outflow tract and to the right atrial appendage. The patient is rewarmed to 37C temperature. The patient is weaned and disconnected from cardiopulmonary bypass.  The patient's rhythm at separation from bypass was sinus bradycardia.  The patient was weaned from cardiopulmonary bypass with low dose inotropic support.   Followup transesophageal echocardiogram performed after separation from bypass revealed no changes from the preoperative exam.  The aortic and venous cannula were removed uneventfully. Protamine was administered to reverse the anticoagulation. The mediastinum and pleural space were inspected for hemostasis and irrigated with saline solution. The mediastinum and left pleural space were drained using fluted chest tubes placed through separate stab incisions inferiorly.  The soft tissues anterior to the aorta were reapproximated loosely. The sternum is closed with double strength sternal wire. The soft tissues anterior to the sternum were closed in multiple layers and the skin is closed with a running subcuticular skin closure.  The post-bypass portion of the operation was notable for stable rhythm and hemodynamics.     Disposition:  The patient tolerated the procedure well and is transported to the surgical intensive care in stable condition. There are no intraoperative complications. All sponge instrument and needle counts are verified correct at completion of the operation.    Brantley Fling, MD 06/18/2020 2:15 PM

## 2020-06-18 NOTE — Anesthesia Preprocedure Evaluation (Signed)
Anesthesia Evaluation  Patient identified by MRN, date of birth, ID band Patient awake    Reviewed: Allergy & Precautions, H&P , NPO status , Patient's Chart, lab work & pertinent test results  Airway Mallampati: II   Neck ROM: full    Dental   Pulmonary asthma ,    breath sounds clear to auscultation       Cardiovascular hypertension, + CAD, + Past MI and +CHF   Rhythm:regular Rate:Normal     Neuro/Psych    GI/Hepatic   Endo/Other  diabetesHypothyroidism   Renal/GU Renal InsufficiencyRenal disease     Musculoskeletal  (+) Arthritis ,   Abdominal   Peds  Hematology  (+) anemia ,   Anesthesia Other Findings   Reproductive/Obstetrics                             Anesthesia Physical Anesthesia Plan  ASA: III  Anesthesia Plan: General   Post-op Pain Management:    Induction: Intravenous  PONV Risk Score and Plan: 3 and Dexamethasone, Midazolam, Ondansetron and Treatment may vary due to age or medical condition  Airway Management Planned: Oral ETT  Additional Equipment: Arterial line, CVP, PA Cath, TEE and Ultrasound Guidance Line Placement  Intra-op Plan:   Post-operative Plan: Post-operative intubation/ventilation  Informed Consent: I have reviewed the patients History and Physical, chart, labs and discussed the procedure including the risks, benefits and alternatives for the proposed anesthesia with the patient or authorized representative who has indicated his/her understanding and acceptance.       Plan Discussed with: CRNA, Anesthesiologist and Surgeon  Anesthesia Plan Comments:         Anesthesia Quick Evaluation

## 2020-06-18 NOTE — Progress Notes (Signed)
PROGRESS NOTE    Angela Frazier  WUJ:[id number] DOB: 09/12/1945 DOA: 06/15/2020 PCP: System, Provider Not In    Brief Narrative:  Angela Frazier is a 74 year old female with past medical history notable for essential hypertension, hyperlipidemia, OSA, hypothyroidism, asthma, type 2 diabetes mellitus who presented to the [facility] ED with complaint of chest pain.  Chest pain localized to the center of chest described as a tightness with radiation to the back of the left arm.  Chest pain worsened after assisting family member that fell.  Associated with nausea.  No vomiting, no fever/chills, no cough, no abdominal pain.  In the ED, BP 190/83, glucose 353.  Troponin [id number].  Chest x-ray showed age-indeterminate mild interstitial prominence reflective of chronic changes versus mild interstitial edema.  EKG with NSR, rate 93 with no acute changes.  ED physician consulted cardiology, Dr. Okey Dupreose who recommend admitting patient for NSTEMI.  Patient started on IV heparin drip and transferred to [facility] under hospital service for further evaluation and management.   Assessment & Plan:   Principal Problem:   NSTEMI (non-ST elevated myocardial infarction) (HCC) Active Problems:   Chest pain   Nausea   Obesity (BMI 30-39.9)   Essential hypertension   Hyperlipidemia   Hypothyroidism   Asthma, chronic, unspecified asthma severity, with acute exacerbation   Hyperglycemia due to diabetes mellitus (HCC)   Leukocytosis   Prolonged QT interval   NSTEMI Multi-vessel CAD Patient presenting to the ED with acute onset chest pain while assisting a family member who fell.  Pain localized to the center of her chest with radiation to the left arm associate with nausea.  Troponin on presentation elevated at 1069.  EKG with normal sinus rhythm with no concerning ST elevation/depression or T wave inversions.  Patient was started on IV heparin drip and transferred to [facility] for cardiology evaluation. --Troponin  [id number] --LHC 11/24 w/ distal RCA 100% stenosis, mid RCA 50% stenosed, proximal and mid Cx 80% stenosed, 1rst Marg 75% stenosed, Ost LAD to proximal LAD 90% stenosed, RPDA 80% stenosed, RPAV 80% stenosed, LVEF 35-45%. --Continue heparin drip --Atorvastatin 80 mg p.o. daily --Aspirin 81 mg p.o. daily --Cardiothoracic surgery following, plan CABG 06/18/2020 --Continue to monitor on telemetry  Acute Heart failure with reduced EF Essential hypertension LVEF noted on LHC 35-40%.  TTE 06/16/2020 with LVEF 35-40%, LV moderately decreased function with regional wall motion abnormalities, mild concentric LVH, grade 2 diastolic dysfunction, LA/RA mildly dilated, trivial MR, IVC dilated, no aortic stenosis. --Carvedilol 6.25 mg p.o. twice daily --Losartan 100 mg p.o. daily --Spironolactone 25 mg p.o. daily --Strict I's and O's Daily weights  Hypomagnesemia Magnesium 2.5 today --Follow electrolytes daily  Type 2 diabetes mellitus Hemoglobin A1c 9.0, poorly controlled.  Home regimen includes Metformin 1000 mg p.o. twice daily and NovoLog 70/30 twice daily --Hold oral hypoglycemics while inpatient --Increase Lantus to 10 units subcutaneously qHS --Moderate insulin sliding scale for coverage --CBGs qAC/HS --Continue monitor glucose and adjust accordingly  Peripheral neuropathy: Continue gabapentin 300 mg p.o. nightly  Depression/anxiety: --Continue Lexapro 20 mg p.o. daily --ativan 1mg  PO qHS prn  Hyperlipidemia Home simvastatin changed for atorvastatin 80 mg p.o. daily due to significant CAD  Hypothyroidism: Continue levothyroxine 137 mcg p.o. daily  Asthma: --Continue Symbicort (w/ Breo Ellipta as hospital substitution) and Singulair --Albuterol MDI as needed   DVT prophylaxis: Heparin drip Code Status: Full code Family Communication: Updated patient's son who is present at bedside this morning  Disposition Plan:  Status is: Inpatient  Remains  inpatient  appropriate because:Ongoing diagnostic testing needed not appropriate for outpatient work up, Unsafe d/c plan, IV treatments appropriate due to intensity of illness or inability to take PO and Inpatient level of care appropriate due to severity of illness   Dispo: The patient is from: Home              Anticipated d/c is to: To be determined              Anticipated d/c date is: > 3 days              Patient currently is not medically stable to d/c.  Pending CABG 11/26   Consultants:   Cardiology  Cardiothoracic surgery; Dr. Vickey Sages  Procedures:   Left heart catheterization 06/16/2020, Dr. Eldridge Dace  Dist RCA lesion is 100% stenosed. Left to right collaterals.  Mid RCA lesion is 50% stenosed.  Prox Cx lesion is 80% stenosed.  Mid Cx lesion is 80% stenosed.  1st Mrg lesion is 75% stenosed. This is a small vessel and may not be a target for CABG. The OM2 is a large vessel that is likely a better bypass target.  Ost LAD to Prox LAD lesion is 90% stenosed.  There is moderate left ventricular systolic dysfunction.  LV end diastolic pressure is moderately elevated.  The left ventricular ejection fraction is 35-45% by visual estimate.  There is no aortic valve stenosis.  RPDA lesion is 80% stenosed.  RPAV lesion is 80% stenosed.   Antimicrobials:   None   Subjective: Patient seen and examined bedside, resting comfortably. Son present at bedside. Denies chest pain or shortness of breath. Awaiting surgical intervention for CABG later this morning. No specific complaints or concerns voiced by patient or son this morning. Denies headache, no visual changes, no palpitations, no abdominal pain, no weakness, no fatigue, no paresthesias, no fever/chills/night sweats, no nausea/vomiting/diarrhea. No acute events overnight per nursing staff.  Objective: Vitals:   06/17/20 2036 06/17/20 2259 06/18/20 0531 06/18/20 0540  BP: (!) 107/57 135/75 (!) 112/57 (!) 112/57  Pulse: (!) 53 61   (!) 59  Resp: 15 20 18    Temp: 98.3 F (36.8 C) 98.3 F (36.8 C) 97.7 F (36.5 C) 97.7 F (36.5 C)  TempSrc: Oral Oral Oral Oral  SpO2: 93% 96% 97%   Weight:   104.3 kg   Height:        Intake/Output Summary (Last 24 hours) at 06/18/2020 0630 Last data filed at 06/17/2020 2138 Gross per 24 hour  Intake 166.21 ml  Output --  Net 166.21 ml   Filed Weights   06/16/20 0030 06/17/20 0332 06/18/20 0531  Weight: 102.8 kg 102.9 kg 104.3 kg    Examination:  General exam: Appears calm and comfortable  Respiratory system: Clear to auscultation. Respiratory effort normal.  Oxygenating well on room air Cardiovascular system: S1 & S2 heard, RRR. No JVD, murmurs, rubs, gallops or clicks. No pedal edema. Gastrointestinal system: Abdomen is nondistended, soft and nontender. No organomegaly or masses felt. Normal bowel sounds heard. Central nervous system: Alert and oriented. No focal neurological deficits. Extremities: Symmetric 5 x 5 power. Skin: No rashes, lesions or ulcers Psychiatry: Judgement and insight appear normal. Mood & affect appropriate.     Data Reviewed: I have personally reviewed following labs and imaging studies  CBC: Recent Labs  Lab 06/15/20 1921 06/16/20 0123 06/16/20 0232 06/17/20 0031 06/18/20 0454  WBC 13.0* 12.8* 11.6* 13.4* 11.4*  HGB 13.4 12.8 12.7 12.7 10.8*  HCT 40.6 39.8 38.0 40.0 33.1*  MCV 88.1 86.9 88.2 90.5 88.5  PLT 272 272 266 275 222   Basic Metabolic Panel: Recent Labs  Lab 06/15/20 1921 06/16/20 0232 06/17/20 0031 06/18/20 0454  NA 134* 138 135 134*  K 3.8 4.2 4.0 3.5  CL 103 103 103 98  CO2 19* 24 19* 23  GLUCOSE 353* 202* 251* 89  BUN 26*  CREATININE 1.08* 1.07* 1.10* 1.64*  CALCIUM 8.6* 9.0 9.1 8.6*  MG  --  1.4* 1.4* 2.5*  PHOS  --  3.6  --   --    GFR: Estimated Creatinine Clearance: 36.6 mL/min (A) (by C-G formula based on SCr of 1.64 mg/dL (H)). Liver Function Tests: Recent Labs  Lab 06/16/20 0232   AST 32  ALT 16  ALKPHOS 52  BILITOT 1.0  PROT 6.7  ALBUMIN 3.3*   No results for input(s): LIPASE, AMYLASE in the last 168 hours. No results for input(s): AMMONIA in the last 168 hours. Coagulation Profile: Recent Labs  Lab 06/16/20 0232 06/17/20 1903  INR 1.1 1.1   Cardiac Enzymes: No results for input(s): CKTOTAL, CKMB, CKMBINDEX, TROPONINI in the last 168 hours. BNP (last 3 results) No results for input(s): PROBNP in the last 8760 hours. HbA1C: Recent Labs    06/16/20 0513 06/17/20 1903  HGBA1C 9.0* 9.1*   CBG: Recent Labs  Lab 06/17/20 1149 06/17/20 1626 06/17/20 2034 06/18/20 0036 06/18/20 0523  GLUCAP 238* 281* 310* 288* 90   Lipid Profile: Recent Labs    06/16/20 0513  CHOL 139  HDL 34*  LDLCALC 82  TRIG 161  CHOLHDL 4.1   Thyroid Function Tests: No results for input(s): TSH, T4TOTAL, FREET4, T3FREE, THYROIDAB in the last 72 hours. Anemia Panel: No results for input(s): VITAMINB12, FOLATE, FERRITIN, TIBC, IRON, RETICCTPCT in the last 72 hours. Sepsis Labs: No results for input(s): PROCALCITON, LATICACIDVEN in the last 168 hours.  Recent Results (from the past 240 hour(s))  Resp Panel by RT-PCR (Flu A&B, Covid) Nasopharyngeal Swab     Status: None   Collection Time: 06/15/20  9:34 PM   Specimen: Nasopharyngeal Swab; Nasopharyngeal(NP) swabs in vial transport medium  Result Value Ref Range Status   SARS Coronavirus 2 by RT PCR NEGATIVE NEGATIVE Final    Comment: (NOTE) SARS-CoV-2 target nucleic acids are NOT DETECTED.  The SARS-CoV-2 RNA is generally detectable in upper respiratory specimens during the acute phase of infection. The lowest concentration of SARS-CoV-2 viral copies this assay can detect is 138 copies/mL. A negative result does not preclude SARS-Cov-2 infection and should not be used as the sole basis for treatment or other patient management decisions. A negative result may occur with  improper specimen collection/handling,  submission of specimen other than nasopharyngeal swab, presence of viral mutation(s) within the areas targeted by this assay, and inadequate number of viral copies(<138 copies/mL). A negative result must be combined with clinical observations, patient history, and epidemiological information. The expected result is Negative.  Fact Sheet for Patients:  BloggerCourse.com  Fact Sheet for Healthcare Providers:  SeriousBroker.it  This test is no t yet approved or cleared by the [place] FDA and  has been authorized for detection and/or diagnosis of SARS-CoV-2 by FDA under an Emergency Use Authorization (EUA). This EUA will remain  in effect (meaning this test can be used) for the duration of the COVID-19 declaration under Section 564(b)(1) of the Act, 21 U.S.C.section 360bbb-3(b)(1), unless the authorization is terminated  or  revoked sooner.       Influenza A by PCR NEGATIVE NEGATIVE Final   Influenza B by PCR NEGATIVE NEGATIVE Final    Comment: (NOTE) The Xpert Xpress SARS-CoV-2/FLU/RSV plus assay is intended as an aid in the diagnosis of influenza from Nasopharyngeal swab specimens and should not be used as a sole basis for treatment. Nasal washings and aspirates are unacceptable for Xpert Xpress SARS-CoV-2/FLU/RSV testing.  Fact Sheet for Patients: BloggerCourse.com  Fact Sheet for Healthcare Providers: SeriousBroker.it  This test is not yet approved or cleared by the [place] FDA and has been authorized for detection and/or diagnosis of SARS-CoV-2 by FDA under an Emergency Use Authorization (EUA). This EUA will remain in effect (meaning this test can be used) for the duration of the COVID-19 declaration under Section 564(b)(1) of the Act, 21 U.S.C. section 360bbb-3(b)(1), unless the authorization is terminated or revoked.  Performed at [facility], [address] [place], [place] [address]   Surgical pcr screen     Status: Abnormal   Collection Time: 06/16/20 11:42 PM   Specimen: Nasal Mucosa; Nasal Swab  Result Value Ref Range Status   MRSA, PCR NEGATIVE NEGATIVE Final   Staphylococcus aureus POSITIVE (A) NEGATIVE Final    Comment: (NOTE) The Xpert SA Assay (FDA approved for NASAL specimens in patients 55 years of age and older), is one component of a comprehensive surveillance program. It is not intended to diagnose infection nor to guide or monitor treatment. Performed at [facility], [address] [place], [place] [address]          Radiology Studies: CT CHEST WO CONTRAST  Result Date: 06/17/2020 CLINICAL DATA:  Coronary artery disease with plan for CABG EXAM: CT CHEST WITHOUT CONTRAST TECHNIQUE: Multidetector CT imaging of the chest was performed following the standard protocol without IV contrast. COMPARISON:  X-ray 06/15/2020 FINDINGS: Cardiovascular: Mild cardiomegaly. No pericardial effusion. Thoracic aorta is nonaneurysmal. Mid ascending aorta measures 3.1 cm in diameter. There are atherosclerotic calcifications throughout the thoracic aorta. Three vessel arch. Advanced 3 vessel coronary artery atherosclerosis. Main pulmonary trunk measures 3.2 cm in diameter. Mediastinum/Nodes: No axillary or mediastinal lymphadenopathy. Evaluation of the hilar structures is somewhat limited in the absence of intravenous contrast. Heterogeneously enlarged thyroid gland with the left thyroid lobe extending into the superior mediastinum. 1.6 cm inferior left thyroid lobe nodule with coarse calcifications. There is mild rightward deviation of the trachea secondary to enlarged left thyroid lobe. Trachea otherwise within normal limits. Small hiatal hernia. Esophagus otherwise within normal limits. Lungs/Pleura: Trace layering bilateral pleural effusions. Subtle ground-glass attenuation within the right lung base. Otherwise, no focal  airspace consolidation. No pneumothorax. Upper Abdomen: No acute abnormality. Musculoskeletal: No chest wall mass or suspicious bone lesions identified. IMPRESSION: 1. Mild cardiomegaly with 3 vessel coronary artery atherosclerosis. 2. Trace layering bilateral pleural effusions with subtle ground-glass attenuation within the right lung base, which may represent atelectasis or mild edema. 3. Heterogeneously enlarged thyroid gland with the left thyroid lobe extending into the superior mediastinum. 1.6 cm inferior left thyroid lobe nodule with coarse calcifications. Recommend recommend nonemergent thyroid ultrasound (ref: J Am Coll Radiol. 2015 Feb;12(2): 143-50). 4. Small hiatal hernia. 5. Aortic atherosclerosis. (ICD10-I70.0). Electronically Signed   By: Duanne Guess D.O.   On: 06/17/2020 11:25   CARDIAC CATHETERIZATION  Result Date: 06/16/2020  Dist RCA lesion is 100% stenosed. Left to right collaterals.  Mid RCA lesion is 50% stenosed.  Prox Cx lesion is 80%  stenosed.  Mid Cx lesion is 80% stenosed.  1st Mrg lesion is 75% stenosed. This is a small vessel and may not be a target for CABG. The OM2 is a large vessel that is likely a better bypass target.  Ost LAD to Prox LAD lesion is 90% stenosed.  There is moderate left ventricular systolic dysfunction.  LV end diastolic pressure is moderately elevated.  The left ventricular ejection fraction is 35-45% by visual estimate.  There is no aortic valve stenosis.  RPDA lesion is 80% stenosed.  RPAV lesion is 80% stenosed.  Severe three vessel disease. Will obtain CVTS consult for CABG.  Medical therapy for LV dysfunction.  Likely will need grafts to LAD, OM2, PDA, PLA   ECHOCARDIOGRAM COMPLETE  Result Date: 06/16/2020    ECHOCARDIOGRAM REPORT   Patient Name:   STARLA DELLER Date of Exam: 06/16/2020 Medical Rec #:  [id number]    Height:       65.0 in Accession #:    [id number]   Weight:       226.6 lb Date of Birth:  [birth year]     BSA:          2.086  m Patient Age:    74 years     BP:           129/65 mmHg Patient Gender: F            HR:           69 bpm. Exam Location:  Inpatient Procedure: 2D Echo, 3D Echo, Color Doppler, Cardiac Doppler and Intracardiac            Opacification Agent Indications:    Acute Coronary Syndrome i24.9  History:        Patient has no prior history of Echocardiogram examinations.                 NSTEMI; Risk Factors:Hypertension, Diabetes and Dyslipidemia.  Sonographer:    Irving Burton Senior RDCS Referring Phys: [phone] IMPRESSIONS  1. Left ventricular ejection fraction, by estimation, is 35 to 40%. The left ventricle has moderately decreased function. The left ventricle demonstrates regional wall motion abnormalities (see scoring diagram/findings for description). There is mild concentric left ventricular hypertrophy. Left ventricular diastolic parameters are consistent with Grade II diastolic dysfunction (pseudonormalization). Elevated left ventricular end-diastolic pressure.  2. Right ventricular systolic function is normal. The right ventricular size is normal. There is mildly elevated pulmonary artery systolic pressure.  3. Left atrial size was mildly dilated.  4. Right atrial size was mildly dilated.  5. The mitral valve is normal in structure. Trivial mitral valve regurgitation. No evidence of mitral stenosis.  6. The aortic valve is grossly normal. There is mild calcification of the aortic valve. Aortic valve regurgitation is not visualized. No aortic stenosis is present.  7. The inferior vena cava is dilated in size with <50% respiratory variability, suggesting right atrial pressure of 15 mmHg. Conclusion(s)/Recommendation(s): Reduced LVEF, focal wall motion abnormalities as noted. No LV thrombus seen with contrast. FINDINGS  Left Ventricle: There is an area of concern for thrombus at the apex, but it does not fully exclude contrast with definity. Therefore, not consistent with LV thrombus. Left ventricular ejection  fraction, by estimation, is 35 to 40%. The left ventricle has moderately decreased function. The left ventricle demonstrates regional wall motion abnormalities. The left ventricular internal cavity size was normal in size. There is mild concentric left ventricular hypertrophy. Left ventricular diastolic parameters are consistent  with Grade II diastolic dysfunction (pseudonormalization). Elevated left ventricular end-diastolic pressure.  LV Wall Scoring: The mid and distal anterior wall and apex are akinetic. The mid and distal lateral wall, mid and distal anterior septum, entire inferior wall, mid anterolateral segment, mid inferoseptal segment, and basal anterior segment are hypokinetic. The basal anteroseptal segment, basal inferolateral segment, basal anterolateral segment, and basal inferoseptal segment are normal. Right Ventricle: The right ventricular size is normal. No increase in right ventricular wall thickness. Right ventricular systolic function is normal. There is mildly elevated pulmonary artery systolic pressure. The tricuspid regurgitant velocity is 2.58  m/s, and with an assumed right atrial pressure of 15 mmHg, the estimated right ventricular systolic pressure is 41.6 mmHg. Left Atrium: Left atrial size was mildly dilated. Right Atrium: Right atrial size was mildly dilated. Pericardium: Trivial pericardial effusion is present. Presence of pericardial fat pad. Mitral Valve: The mitral valve is normal in structure. Trivial mitral valve regurgitation. No evidence of mitral valve stenosis. Tricuspid Valve: The tricuspid valve is normal in structure. Tricuspid valve regurgitation is trivial. No evidence of tricuspid stenosis. Aortic Valve: The aortic valve is grossly normal. There is mild calcification of the aortic valve. Aortic valve regurgitation is not visualized. No aortic stenosis is present. Pulmonic Valve: The pulmonic valve was not well visualized. Pulmonic valve regurgitation is trivial. No  evidence of pulmonic stenosis. Aorta: The aortic root and ascending aorta are structurally normal, with no evidence of dilitation. Venous: The inferior vena cava is dilated in size with less than 50% respiratory variability, suggesting right atrial pressure of 15 mmHg. IAS/Shunts: The atrial septum is grossly normal.  LEFT VENTRICLE PLAX 2D LVIDd:         4.70 cm      Diastology LVIDs:         3.10 cm      LV e' medial:    3.81 cm/s LV PW:         1.20 cm      LV E/e' medial:  31.2 LV IVS:        1.30 cm      LV e' lateral:   3.59 cm/s LVOT diam:     2.10 cm      LV E/e' lateral: 33.1 LV SV:         60 LV SV Index:   29 LVOT Area:     3.46 cm  LV Volumes (MOD) LV vol d, MOD A2C: 105.0 ml LV vol d, MOD A4C: 96.6 ml LV vol s, MOD A2C: 64.1 ml LV vol s, MOD A4C: 50.8 ml LV SV MOD A2C:     40.9 ml LV SV MOD A4C:     96.6 ml LV SV MOD BP:      43.2 ml RIGHT VENTRICLE RV S prime:     8.59 cm/s TAPSE (M-mode): 2.3 cm LEFT ATRIUM             Index       RIGHT ATRIUM           Index LA diam:        4.10 cm 1.97 cm/m  RA Area:     17.40 cm LA Vol (A2C):   52.1 ml 24.98 ml/m RA Volume:   49.40 ml  23.68 ml/m LA Vol (A4C):   51.6 ml 24.74 ml/m LA Biplane Vol: 55.7 ml 26.70 ml/m  AORTIC VALVE LVOT Vmax:   75.70 cm/s LVOT Vmean:  58.300 cm/s LVOT VTI:    0.172  m  AORTA Ao Root diam: 2.80 cm Ao Asc diam:  3.50 cm MITRAL VALVE                TRICUSPID VALVE MV Area (PHT): 3.31 cm     TR Peak grad:   26.6 mmHg MV Decel Time: 229 msec     TR Vmax:        258.00 cm/s MV E velocity: 119.00 cm/s MV A velocity: 111.00 cm/s  SHUNTS MV E/A ratio:  1.07         Systemic VTI:  0.17 m                             Systemic Diam: 2.10 cm Jodelle Red MD Electronically signed by Jodelle Red MD Signature Date/Time: 06/16/2020/7:15:49 PM    Final    VAS US DOPPLER PRE CABG  Result Date: 06/17/2020 PREOPERATIVE VASCULAR EVALUATION  Indications:      Pre-CABG. Risk Factors:     Hypertension, hyperlipidemia, Diabetes.  Limitations:      Body habitus Comparison Study: No prior study Performing Technologist: Gertie Fey MHA, RVT, RDCS, RDMS  Examination Guidelines: A complete evaluation includes B-mode imaging, spectral Doppler, color Doppler, and power Doppler as needed of all accessible portions of each vessel. Bilateral testing is considered an integral part of a complete examination. Limited examinations for reoccurring indications may be performed as noted.  Right Carotid Findings: +----------+-------+-------+--------+---------------------------------+--------+           PSV    EDV    StenosisDescribe                         Comments           cm/s   cm/s                                                     +----------+-------+-------+--------+---------------------------------+--------+ CCA Prox  67     12             heterogenous and irregular                +----------+-------+-------+--------+---------------------------------+--------+ CCA Distal80     14             heterogenous, irregular and                                               calcific                                  +----------+-------+-------+--------+---------------------------------+--------+ ICA Prox  75     17             heterogenous, irregular and                                               calcific                                  +----------+-------+-------+--------+---------------------------------+--------+  ICA Distal84     20                                              tortuous +----------+-------+-------+--------+---------------------------------+--------+ ECA       123                   smooth and heterogenous                   +----------+-------+-------+--------+---------------------------------+--------+ Portions of this table do not appear on this page. +----------+--------+-------+----------------+------------+           PSV cm/sEDV cmsDescribe        Arm Pressure  +----------+--------+-------+----------------+------------+ Subclavian107            Multiphasic, WNL             +----------+--------+-------+----------------+------------+ +---------+--------+--+--------+--+---------+ VertebralPSV cm/s68EDV cm/s13Antegrade +---------+--------+--+--------+--+---------+ Left Carotid Findings: +----------+--------+--------+--------+--------------------------+--------+           PSV cm/sEDV cm/sStenosisDescribe                  Comments +----------+--------+--------+--------+--------------------------+--------+ CCA Prox  90      16                                                 +----------+--------+--------+--------+--------------------------+--------+ CCA Distal80      15              smooth and heterogenous            +----------+--------+--------+--------+--------------------------+--------+ ICA Prox  117     18              heterogenous and calcific          +----------+--------+--------+--------+--------------------------+--------+ ICA Distal103     18                                                 +----------+--------+--------+--------+--------------------------+--------+ ECA       131     19              heterogenous and irregular         +----------+--------+--------+--------+--------------------------+--------+ +----------+--------+--------+----------------+------------+ SubclavianPSV cm/sEDV cm/sDescribe        Arm Pressure +----------+--------+--------+----------------+------------+           141             Multiphasic, WNL             +----------+--------+--------+----------------+------------+ +---------+--------+--+--------+--+---------+ VertebralPSV cm/s39EDV cm/s11Antegrade +---------+--------+--+--------+--+---------+  ABI Findings: +--------+------------------+-----+---------+----------------------------------+ Right   Rt Pressure (mmHg)IndexWaveform Comment                             +--------+------------------+-----+---------+----------------------------------+ Brachial                       triphasicUnable to obtain pressure due to                                           recent catheterization             +--------+------------------+-----+---------+----------------------------------+  PTA                            triphasic                                   +--------+------------------+-----+---------+----------------------------------+ DP                             triphasic                                   +--------+------------------+-----+---------+----------------------------------+ +--------+------------------+-----+---------+-------+ Left    Lt Pressure (mmHg)IndexWaveform Comment +--------+------------------+-----+---------+-------+ [id number]                    triphasic        +--------+------------------+-----+---------+-------+ PTA                            triphasic        +--------+------------------+-----+---------+-------+ DP                             triphasic        +--------+------------------+-----+---------+-------+  Right Doppler Findings: +-----------+--------+-----+---------+-----------------------------------------+ Site       PressureIndexDoppler  Comments                                  +-----------+--------+-----+---------+-----------------------------------------+ Brachial                triphasicUnable to obtain pressure due to recent                                    catheterization                           +-----------+--------+-----+---------+-----------------------------------------+ Radial                  triphasic                                          +-----------+--------+-----+---------+-----------------------------------------+ Ulnar                   triphasic                                           +-----------+--------+-----+---------+-----------------------------------------+ Palmar Arch                      Unable to evaluate due to recent                                           catheterization                           +-----------+--------+-----+---------+-----------------------------------------+  Left Doppler Findings: +-----------+--------+-----+---------+--------------------+ Site       PressureIndexDoppler  Comments             +-----------+--------+-----+---------+--------------------+ Brachial   106          triphasic                     +-----------+--------+-----+---------+--------------------+ Radial                  triphasic                     +-----------+--------+-----+---------+--------------------+ Ulnar                   triphasic                     +-----------+--------+-----+---------+--------------------+ Palmar Arch                      Within normal limits +-----------+--------+-----+---------+--------------------+  Summary: Right Carotid: Velocities in the right ICA are consistent with a 1-39% stenosis. Left Carotid: Velocities in the left ICA are consistent with a 1-39% stenosis. Vertebrals:  Bilateral vertebral arteries demonstrate antegrade flow. Subclavians: Normal flow hemodynamics were seen in bilateral subclavian              arteries. Pedal waveforms: Bilateral pedal waveforms are within normal limits at rest. Right Upper Extremity: No significant arterial obstruction detected in the right upper extremity. Left Upper Extremity: No significant arterial obstruction detected in the left upper extremity. Normal PPG waveforms with radial artery compression suggest palmar arch patency.    Preliminary         Scheduled Meds: . aspirin EC  81 mg Oral Daily  . atorvastatin  80 mg Oral Daily  . carvedilol  6.25 mg Oral BID WC  . Chlorhexidine Gluconate Cloth  6 each Topical Daily  . epinephrine  0-10 mcg/min Intravenous To OR   . fluticasone  2 spray Each Nare Daily  . fluticasone furoate-vilanterol  1 puff Inhalation Daily  . gabapentin  300 mg Oral QHS  . heparin-papaverine-plasmalyte irrigation   Irrigation To OR  . insulin aspart  0-15 Units Subcutaneous Q4H  . insulin glargine  10 Units Subcutaneous QHS  . insulin   Intravenous To OR  . levothyroxine  137 mcg Oral QAC breakfast  . losartan  100 mg Oral Daily  . magnesium sulfate  40 mEq Other To OR  . montelukast  10 mg Oral QHS  . mupirocin ointment  1 application Nasal BID  . phenylephrine  30-200 mcg/min Intravenous To OR  . potassium chloride  80 mEq Other To OR  . sodium chloride flush  3 mL Intravenous Q12H  . spironolactone  25 mg Oral Daily  . tranexamic acid  15 mg/kg Intravenous To OR  . tranexamic acid  2 mg/kg Intracatheter To OR   Continuous Infusions: . sodium chloride    . dexmedetomidine    . heparin 30,000 units/NS 1000 mL solution for CELLSAVER    . heparin 1,300 Units/hr (06/17/20 1849)  . levofloxacin (LEVAQUIN) IV    . milrinone    . nitroGLYCERIN    . norepinephrine    . tranexamic acid (CYKLOKAPRON) infusion ([facility])    . vancomycin       LOS: 2 days    Time spent: 36 minutes spent on chart review, discussion with nursing staff, consultants, updating family and interview/physical exam; more than 50% of that  time was spent in counseling and/or coordination of care.    Alvira Philips [place], DO [facility] Available via Epic secure chat 7am-7pm After these hours, please refer to coverage provider listed on [email or web address] 06/18/2020, 6:30 AM

## 2020-06-18 NOTE — Discharge Instructions (Signed)

## 2020-06-18 NOTE — Transfer of Care (Signed)
Immediate Anesthesia Transfer of Care Note  Patient: Angela Frazier  Procedure(s) Performed: CORONARY ARTERY BYPASS GRAFTING (CABG), ON PUMP, TIMES FOUR, USING LEFT INTERNAL MAMMARY ARTERY, ENDOSCOPICALLY HARVESTED RIGHT GREATER SAPHENOUS VEIN, AND LEFT RADIAL ARTERY (OPEN HARVEST) (N/A Chest) RADIAL ARTERY HARVEST (Left Arm Lower) TRANSESOPHAGEAL ECHOCARDIOGRAM (TEE) (N/A ) INDOCYANINE GREEN FLUORESCENCE IMAGING (ICG) (N/A Chest)  Patient Location: SICU  Anesthesia Type:General  Level of Consciousness: patient cooperative and Patient remains intubated per anesthesia plan  Airway & Oxygen Therapy: Patient remains intubated per anesthesia plan and Patient placed on Ventilator (see vital sign flow sheet for setting)  Post-op Assessment: Report given to RN and Post -op Vital signs reviewed and stable  Post vital signs: Reviewed and stable  Last Vitals:  Vitals Value Taken Time  BP    Temp    Pulse    Resp    SpO2      Last Pain:  Vitals:   06/18/20 0540  TempSrc: Oral  PainSc:          Complications: No complications documented.

## 2020-06-18 NOTE — Progress Notes (Signed)
NIF -30. VC 0.7L.

## 2020-06-18 NOTE — Anesthesia Procedure Notes (Signed)
Arterial Line Insertion Start/End11/26/2021 6:35 AM, 06/18/2020 7:00 AM Performed by: Rosiland Oz, CRNA, CRNA  Preanesthetic checklist: patient identified, IV checked, site marked, risks and benefits discussed, surgical consent, monitors and equipment checked, pre-op evaluation, timeout performed and anesthesia consent Lidocaine 1% used for infiltration Right, radial was placed Catheter size: 20 G Hand hygiene performed , maximum sterile barriers used  and Seldinger technique used  Attempts: 1 Procedure performed without using ultrasound guided technique. Following insertion, dressing applied and Biopatch. Post procedure assessment: normal and unchanged  Patient tolerated the procedure well with no immediate complications.

## 2020-06-19 ENCOUNTER — Inpatient Hospital Stay (HOSPITAL_COMMUNITY): Payer: Medicare HMO

## 2020-06-19 LAB — CBC
HCT: 31.6 % — ABNORMAL LOW (ref 36.0–46.0)
HCT: 30.5 % — ABNORMAL LOW (ref 36.0–46.0)
Hemoglobin: 10 g/dL — ABNORMAL LOW (ref 12.0–15.0)
Hemoglobin: 9.8 g/dL — ABNORMAL LOW (ref 12.0–15.0)
MCH: 28.6 pg (ref 26.0–34.0)
MCH: 29 pg (ref 26.0–34.0)
MCHC: 31.6 g/dL (ref 30.0–36.0)
MCHC: 32.1 g/dL (ref 30.0–36.0)
MCV: 90.3 fL (ref 80.0–100.0)
MCV: 90.2 fL (ref 80.0–100.0)
Platelets: 177 10*3/uL (ref 150–400)
Platelets: 144 10*3/uL — ABNORMAL LOW (ref 150–400)
RBC: 3.5 MIL/uL — ABNORMAL LOW (ref 3.87–5.11)
RBC: 3.38 MIL/uL — ABNORMAL LOW (ref 3.87–5.11)
RDW: 14.1 % (ref 11.5–15.5)
RDW: 14.3 % (ref 11.5–15.5)
WBC: 13.1 10*3/uL — ABNORMAL HIGH (ref 4.0–10.5)
WBC: 11.9 10*3/uL — ABNORMAL HIGH (ref 4.0–10.5)
nRBC: 0 % (ref 0.0–0.2)
nRBC: 0 % (ref 0.0–0.2)

## 2020-06-19 LAB — BASIC METABOLIC PANEL
Anion gap: 12 (ref 5–15)
Anion gap: 11 (ref 5–15)
BUN: 17 mg/dL (ref 8–23)
BUN: 16 mg/dL (ref 8–23)
CO2: 21 mmol/L — ABNORMAL LOW (ref 22–32)
CO2: 18 mmol/L — ABNORMAL LOW (ref 22–32)
Calcium: 7.9 mg/dL — ABNORMAL LOW (ref 8.9–10.3)
Calcium: 7.8 mg/dL — ABNORMAL LOW (ref 8.9–10.3)
Chloride: 103 mmol/L (ref 98–111)
Chloride: 101 mmol/L (ref 98–111)
Creatinine, Ser: 1.11 mg/dL — ABNORMAL HIGH (ref 0.44–1.00)
Creatinine, Ser: 0.97 mg/dL (ref 0.44–1.00)
GFR, Estimated: 52 mL/min — ABNORMAL LOW (ref >60)
GFR, Estimated: >60 mL/min (ref >60)
Glucose, Bld: 135 mg/dL — ABNORMAL HIGH (ref 70–99)
Glucose, Bld: 247 mg/dL — ABNORMAL HIGH (ref 70–99)
Potassium: 4 mmol/L (ref 3.5–5.1)
Potassium: 3.5 mmol/L (ref 3.5–5.1)
Sodium: 136 mmol/L (ref 135–145)
Sodium: 130 mmol/L — ABNORMAL LOW (ref 135–145)

## 2020-06-19 LAB — GLUCOSE, CAPILLARY
Glucose-Capillary: 108 mg/dL — ABNORMAL HIGH (ref 70–99)
Glucose-Capillary: 111 mg/dL — ABNORMAL HIGH (ref 70–99)
Glucose-Capillary: 112 mg/dL — ABNORMAL HIGH (ref 70–99)
Glucose-Capillary: 120 mg/dL — ABNORMAL HIGH (ref 70–99)
Glucose-Capillary: 120 mg/dL — ABNORMAL HIGH (ref 70–99)
Glucose-Capillary: 127 mg/dL — ABNORMAL HIGH (ref 70–99)
Glucose-Capillary: 135 mg/dL — ABNORMAL HIGH (ref 70–99)
Glucose-Capillary: 139 mg/dL — ABNORMAL HIGH (ref 70–99)
Glucose-Capillary: 140 mg/dL — ABNORMAL HIGH (ref 70–99)
Glucose-Capillary: 154 mg/dL — ABNORMAL HIGH (ref 70–99)
Glucose-Capillary: 166 mg/dL — ABNORMAL HIGH (ref 70–99)
Glucose-Capillary: 183 mg/dL — ABNORMAL HIGH (ref 70–99)
Glucose-Capillary: 183 mg/dL — ABNORMAL HIGH (ref 70–99)
Glucose-Capillary: 188 mg/dL — ABNORMAL HIGH (ref 70–99)
Glucose-Capillary: 190 mg/dL — ABNORMAL HIGH (ref 70–99)
Glucose-Capillary: 200 mg/dL — ABNORMAL HIGH (ref 70–99)
Glucose-Capillary: 210 mg/dL — ABNORMAL HIGH (ref 70–99)
Glucose-Capillary: 221 mg/dL — ABNORMAL HIGH (ref 70–99)
Glucose-Capillary: 239 mg/dL — ABNORMAL HIGH (ref 70–99)

## 2020-06-19 LAB — MAGNESIUM
Magnesium: 2.6 mg/dL — ABNORMAL HIGH (ref 1.7–2.4)
Magnesium: 2.2 mg/dL (ref 1.7–2.4)

## 2020-06-19 MED ORDER — THIAMINE HCL 100 MG/ML IJ SOLN
Freq: Once | INTRAVENOUS | Status: AC
  Filled 2020-06-19: qty 1000

## 2020-06-19 MED ORDER — MILRINONE LACTATE IN DEXTROSE 20-5 MG/100ML-% IV SOLN
0.2500 ug/kg/min | INTRAVENOUS | Status: DC
  Administered 2020-06-19 (×2): 0.25 ug/kg/min via INTRAVENOUS
  Filled 2020-06-19 (×2): qty 100

## 2020-06-19 MED ORDER — ISOSORBIDE DINITRATE 10 MG PO TABS
10.0000 mg | ORAL_TABLET | Freq: Three times a day (TID) | ORAL | Status: DC
  Administered 2020-06-19 (×3): 10 mg via ORAL
  Filled 2020-06-19 (×3): qty 1

## 2020-06-19 MED ORDER — AMIODARONE HCL IN DEXTROSE 360-4.14 MG/200ML-% IV SOLN
30.0000 mg/h | INTRAVENOUS | Status: DC
  Administered 2020-06-19 – 2020-06-20 (×2): 30 mg/h via INTRAVENOUS
  Filled 2020-06-19 (×3): qty 200

## 2020-06-19 MED ORDER — AMIODARONE HCL IN DEXTROSE 360-4.14 MG/200ML-% IV SOLN
60.0000 mg/h | INTRAVENOUS | Status: AC
  Administered 2020-06-19: 60 mg/h via INTRAVENOUS

## 2020-06-19 MED ORDER — COLCHICINE 0.3 MG HALF TABLET
0.3000 mg | ORAL_TABLET | Freq: Two times a day (BID) | ORAL | Status: DC
  Administered 2020-06-19 – 2020-06-21 (×5): 0.3 mg via ORAL
  Filled 2020-06-19 (×7): qty 1

## 2020-06-19 MED ORDER — ASPIRIN EC 81 MG PO TBEC
81.0000 mg | DELAYED_RELEASE_TABLET | Freq: Every day | ORAL | Status: DC
  Administered 2020-06-19 – 2020-06-25 (×7): 81 mg via ORAL
  Filled 2020-06-19 (×7): qty 1

## 2020-06-19 MED ORDER — POTASSIUM CHLORIDE CRYS ER 20 MEQ PO TBCR
20.0000 meq | EXTENDED_RELEASE_TABLET | ORAL | Status: AC
  Administered 2020-06-19 – 2020-06-20 (×3): 20 meq via ORAL
  Filled 2020-06-19 (×3): qty 1

## 2020-06-19 NOTE — Progress Notes (Signed)
1 Day Post-Op Procedure(s) (LRB): CORONARY ARTERY BYPASS GRAFTING (CABG), ON PUMP, TIMES FOUR, USING LEFT INTERNAL MAMMARY ARTERY, ENDOSCOPICALLY HARVESTED RIGHT GREATER SAPHENOUS VEIN, AND LEFT RADIAL ARTERY (OPEN HARVEST) (N/A) RADIAL ARTERY HARVEST (Left) TRANSESOPHAGEAL ECHOCARDIOGRAM (TEE) (N/A) INDOCYANINE GREEN FLUORESCENCE IMAGING (ICG) (N/A) Subjective: No complaints  Objective: Vital signs in last 24 hours: Temp:  [95.9 F (35.5 C)-99.5 F (37.5 C)] 96.8 F (36 C) (11/27 0737) Pulse Rate:  [80-93] 92 (11/27 0737) Cardiac Rhythm: Atrial paced (11/27 0737) Resp:  [12-28] 19 (11/27 0737) BP: (93-140)/(57-72) 135/67 (11/27 0737) SpO2:  [92 %-100 %] 96 % (11/27 0737) Arterial Line BP: (93-164)/(46-71) 128/55 (11/27 0737) FiO2 (%):  [40 %-50 %] 40 % (11/26 1621) Weight:  [112.2 kg] 112.2 kg (11/27 0530)  Hemodynamic parameters for last 24 hours: PAP: (35-57)/(17-31) 56/26 CO:  [3.3 L/min-6.8 L/min] 6.8 L/min CI:  [1.6 L/min/m2-3.2 L/min/m2] 3.2 L/min/m2  Intake/Output from previous day: 11/26 0701 - 11/27 0700 In: 5951 [P.O.:600; I.V.:3886.3; Blood:565; IV Piggyback:899.7] Out: 6130 [Urine:4420; Blood:1130; Chest Tube:580] Intake/Output this shift: Total I/O In: -  Out: 36 [Urine:16; Chest Tube:20]  General appearance: alert and cooperative Neurologic: intact Heart: regular rate and rhythm, S1, S2 normal, no murmur, click, rub or gallop Lungs: clear to auscultation bilaterally Abdomen: soft, non-tender; bowel sounds normal; no masses,  no organomegaly Extremities: extremities normal, atraumatic, no cyanosis or edema Wound: dressed, dry  Lab Results: Recent Labs    06/18/20 1930 06/19/20 0300  WBC 13.5* 13.1*  HGB 10.4* 10.0*  HCT 32.1* 31.6*  PLT 185 177   BMET:  Recent Labs    06/18/20 1930 06/19/20 0300  NA 142 136  K 4.4 4.0  CL 107 103  CO2 26 21*  GLUCOSE 141* 135*  BUN 21 17  CREATININE 1.12* 1.11*  CALCIUM 8.1* 7.9*    PT/INR:  Recent  Labs    06/18/20 1345  LABPROT 17.0*  INR 1.4*   ABG    Component Value Date/Time   PHART 7.269 (L) 06/18/2020 1828   HCO3 21.8 06/18/2020 1828   TCO2 23 06/18/2020 1828   ACIDBASEDEF 5.0 (H) 06/18/2020 1828   O2SAT 96.0 06/18/2020 1828   CBG (last 3)  Recent Labs    06/19/20 0247 06/19/20 0502 06/19/20 0732  GLUCAP 135* 120* 210*    Assessment/Plan: S/P Procedure(s) (LRB): CORONARY ARTERY BYPASS GRAFTING (CABG), ON PUMP, TIMES FOUR, USING LEFT INTERNAL MAMMARY ARTERY, ENDOSCOPICALLY HARVESTED RIGHT GREATER SAPHENOUS VEIN, AND LEFT RADIAL ARTERY (OPEN HARVEST) (N/A) RADIAL ARTERY HARVEST (Left) TRANSESOPHAGEAL ECHOCARDIOGRAM (TEE) (N/A) INDOCYANINE GREEN FLUORESCENCE IMAGING (ICG) (N/A) Mobilize amio gtt  Wean drips Gentle resuscitation   LOS: 3 days    Angela Frazier 06/19/2020

## 2020-06-19 NOTE — Progress Notes (Signed)
Report received from [name], Charity fundraiser.  Agree with documented 1200 assessment of patient.

## 2020-06-20 ENCOUNTER — Inpatient Hospital Stay (HOSPITAL_COMMUNITY): Payer: Medicare HMO

## 2020-06-20 LAB — CBC
HCT: 30.2 % — ABNORMAL LOW (ref 36.0–46.0)
Hemoglobin: 9.7 g/dL — ABNORMAL LOW (ref 12.0–15.0)
MCH: 28.7 pg (ref 26.0–34.0)
MCHC: 32.1 g/dL (ref 30.0–36.0)
MCV: 89.3 fL (ref 80.0–100.0)
Platelets: 139 10*3/uL — ABNORMAL LOW (ref 150–400)
RBC: 3.38 MIL/uL — ABNORMAL LOW (ref 3.87–5.11)
RDW: 14.2 % (ref 11.5–15.5)
WBC: 11.9 10*3/uL — ABNORMAL HIGH (ref 4.0–10.5)
nRBC: 0 % (ref 0.0–0.2)

## 2020-06-20 LAB — GLUCOSE, CAPILLARY
Glucose-Capillary: 112 mg/dL — ABNORMAL HIGH (ref 70–99)
Glucose-Capillary: 113 mg/dL — ABNORMAL HIGH (ref 70–99)
Glucose-Capillary: 114 mg/dL — ABNORMAL HIGH (ref 70–99)
Glucose-Capillary: 116 mg/dL — ABNORMAL HIGH (ref 70–99)
Glucose-Capillary: 120 mg/dL — ABNORMAL HIGH (ref 70–99)
Glucose-Capillary: 128 mg/dL — ABNORMAL HIGH (ref 70–99)
Glucose-Capillary: 129 mg/dL — ABNORMAL HIGH (ref 70–99)
Glucose-Capillary: 172 mg/dL — ABNORMAL HIGH (ref 70–99)
Glucose-Capillary: 176 mg/dL — ABNORMAL HIGH (ref 70–99)
Glucose-Capillary: 239 mg/dL — ABNORMAL HIGH (ref 70–99)
Glucose-Capillary: 241 mg/dL — ABNORMAL HIGH (ref 70–99)
Glucose-Capillary: 89 mg/dL (ref 70–99)

## 2020-06-20 LAB — BASIC METABOLIC PANEL
Anion gap: 10 (ref 5–15)
BUN: 13 mg/dL (ref 8–23)
CO2: 21 mmol/L — ABNORMAL LOW (ref 22–32)
Calcium: 8 mg/dL — ABNORMAL LOW (ref 8.9–10.3)
Chloride: 99 mmol/L (ref 98–111)
Creatinine, Ser: 0.88 mg/dL (ref 0.44–1.00)
GFR, Estimated: >60 mL/min (ref >60)
Glucose, Bld: 124 mg/dL — ABNORMAL HIGH (ref 70–99)
Potassium: 4.1 mmol/L (ref 3.5–5.1)
Sodium: 130 mmol/L — ABNORMAL LOW (ref 135–145)

## 2020-06-20 LAB — COOXEMETRY PANEL
Carboxyhemoglobin: 1.1 % (ref 0.5–1.5)
Carboxyhemoglobin: 1 % (ref 0.5–1.5)
Methemoglobin: 0.9 % (ref 0.0–1.5)
Methemoglobin: 0.7 % (ref 0.0–1.5)
O2 Saturation: 42.5 %
O2 Saturation: 48.9 %
Total hemoglobin: 10 g/dL — ABNORMAL LOW (ref 12.0–16.0)
Total hemoglobin: 10.4 g/dL — ABNORMAL LOW (ref 12.0–16.0)

## 2020-06-20 MED ORDER — INSULIN ASPART 100 UNIT/ML ~~LOC~~ SOLN
0.0000 [IU] | Freq: Every day | SUBCUTANEOUS | Status: DC
  Administered 2020-06-20 – 2020-06-21 (×2): 2 [IU] via SUBCUTANEOUS

## 2020-06-20 MED ORDER — MILRINONE LACTATE IN DEXTROSE 20-5 MG/100ML-% IV SOLN
0.1250 ug/kg/min | INTRAVENOUS | Status: DC
  Administered 2020-06-20: 0.125 ug/kg/min via INTRAVENOUS
  Filled 2020-06-20: qty 100

## 2020-06-20 MED ORDER — CLOPIDOGREL BISULFATE 75 MG PO TABS
75.0000 mg | ORAL_TABLET | Freq: Every day | ORAL | Status: DC
  Administered 2020-06-20 – 2020-06-25 (×6): 75 mg via ORAL
  Filled 2020-06-20 (×6): qty 1

## 2020-06-20 MED ORDER — ISOSORBIDE DINITRATE 10 MG PO TABS
20.0000 mg | ORAL_TABLET | Freq: Three times a day (TID) | ORAL | Status: DC
  Administered 2020-06-20 – 2020-06-25 (×16): 20 mg via ORAL
  Filled 2020-06-20 (×16): qty 2

## 2020-06-20 MED ORDER — INSULIN DETEMIR 100 UNIT/ML ~~LOC~~ SOLN
20.0000 [IU] | Freq: Once | SUBCUTANEOUS | Status: AC
  Administered 2020-06-20: 20 [IU] via SUBCUTANEOUS
  Filled 2020-06-20: qty 0.2

## 2020-06-20 MED ORDER — AMIODARONE HCL 200 MG PO TABS
400.0000 mg | ORAL_TABLET | Freq: Two times a day (BID) | ORAL | Status: DC
  Administered 2020-06-20 – 2020-06-21 (×4): 400 mg via ORAL
  Filled 2020-06-20 (×4): qty 2

## 2020-06-20 MED ORDER — MILRINONE LACTATE IN DEXTROSE 20-5 MG/100ML-% IV SOLN: 0.1250 ug/kg/min | INTRAVENOUS | Status: DC

## 2020-06-20 MED ORDER — INSULIN DETEMIR 100 UNIT/ML ~~LOC~~ SOLN
40.0000 [IU] | Freq: Every day | SUBCUTANEOUS | Status: DC
  Administered 2020-06-20 – 2020-06-24 (×5): 40 [IU] via SUBCUTANEOUS
  Filled 2020-06-20 (×7): qty 0.4

## 2020-06-20 MED ORDER — INSULIN ASPART 100 UNIT/ML ~~LOC~~ SOLN
0.0000 [IU] | Freq: Three times a day (TID) | SUBCUTANEOUS | Status: DC
  Administered 2020-06-20: 3 [IU] via SUBCUTANEOUS
  Administered 2020-06-21: 8 [IU] via SUBCUTANEOUS
  Administered 2020-06-21 (×2): 5 [IU] via SUBCUTANEOUS
  Administered 2020-06-24 – 2020-06-25 (×2): 3 [IU] via SUBCUTANEOUS

## 2020-06-20 NOTE — Progress Notes (Signed)
2 Days Post-Op Procedure(s) (LRB): CORONARY ARTERY BYPASS GRAFTING (CABG), ON PUMP, TIMES FOUR, USING LEFT INTERNAL MAMMARY ARTERY, ENDOSCOPICALLY HARVESTED RIGHT GREATER SAPHENOUS VEIN, AND LEFT RADIAL ARTERY (OPEN HARVEST) (N/A) RADIAL ARTERY HARVEST (Left) TRANSESOPHAGEAL ECHOCARDIOGRAM (TEE) (N/A) INDOCYANINE GREEN FLUORESCENCE IMAGING (ICG) (N/A) Subjective: No complaints Objective: Vital signs in last 24 hours: Temp:  [96.6 F (35.9 C)-99.1 F (37.3 C)] 99.1 F (37.3 C) (11/28 0755) Pulse Rate:  [66-93] 84 (11/28 0600) Cardiac Rhythm: Atrial paced (11/27 2000) Resp:  [16-30] 26 (11/28 0600) BP: (99-125)/(60-85) 125/70 (11/27 1600) SpO2:  [91 %-99 %] 96 % (11/28 0600) Arterial Line BP: (87-179)/(46-66) 166/59 (11/28 0600) Weight:  [113.7 kg] 113.7 kg (11/28 0500)  Hemodynamic parameters for last 24 hours: PAP: (33-54)/(16-24) 37/20 CO:  [4.1 L/min-5.7 L/min] 4.6 L/min CI:  [1.9 L/min/m2-2.7 L/min/m2] 2.2 L/min/m2  Intake/Output from previous day: 11/27 0701 - 11/28 0700 In: 2022.1 [P.O.:660; I.V.:1112.1; IV Piggyback:250] Out: 1021 [Urine:760; Stool:1; Chest Tube:260] Intake/Output this shift: No intake/output data recorded.  General appearance: alert and cooperative Neurologic: intact Heart: regular rate and rhythm, S1, S2 normal, no murmur, click, rub or gallop Lungs: clear to auscultation bilaterally Abdomen: soft, non-tender; bowel sounds normal; no masses,  no organomegaly Extremities: extremities normal, atraumatic, no cyanosis or edema Wound: dressed  Lab Results: Recent Labs    06/19/20 1739 06/20/20 0419  WBC 11.9* 11.9*  HGB 9.8* 9.7*  HCT 30.5* 30.2*  PLT 144* 139*   BMET:  Recent Labs    06/19/20 1739 06/20/20 0419  NA 130* 130*  K 3.5 4.1  CL 101 99  CO2 18* 21*  GLUCOSE 247* 124*  BUN 16 13  CREATININE 0.97 0.88  CALCIUM 7.8* 8.0*    PT/INR:  Recent Labs    06/18/20 1345  LABPROT 17.0*  INR 1.4*   ABG    Component Value  Date/Time   PHART 7.269 (L) 06/18/2020 1828   HCO3 21.8 06/18/2020 1828   TCO2 23 06/18/2020 1828   ACIDBASEDEF 5.0 (H) 06/18/2020 1828   O2SAT 42.5 06/20/2020 0425   CBG (last 3)  Recent Labs    06/20/20 0305 06/20/20 0425 06/20/20 0541  GLUCAP 128* 112* 113*    Assessment/Plan: S/P Procedure(s) (LRB): CORONARY ARTERY BYPASS GRAFTING (CABG), ON PUMP, TIMES FOUR, USING LEFT INTERNAL MAMMARY ARTERY, ENDOSCOPICALLY HARVESTED RIGHT GREATER SAPHENOUS VEIN, AND LEFT RADIAL ARTERY (OPEN HARVEST) (N/A) RADIAL ARTERY HARVEST (Left) TRANSESOPHAGEAL ECHOCARDIOGRAM (TEE) (N/A) INDOCYANINE GREEN FLUORESCENCE IMAGING (ICG) (N/A) Mobilize Diuresis Plan for transfer to step-down: see transfer orders   LOS: 4 days    Linden Dolin 06/20/2020

## 2020-06-20 NOTE — Plan of Care (Signed)
  Problem: Education: Goal: Knowledge of General Education information will improve Description: Including pain rating scale, medication(s)/side effects and non-pharmacologic comfort measures Outcome: Progressing   Problem: Health Behavior/Discharge Planning: Goal: Ability to manage health-related needs will improve Outcome: Progressing   Problem: Clinical Measurements: Goal: Ability to maintain clinical measurements within normal limits will improve Outcome: Progressing Goal: Will remain free from infection Outcome: Progressing Goal: Diagnostic test results will improve Outcome: Progressing Goal: Respiratory complications will improve Outcome: Progressing Goal: Cardiovascular complication will be avoided Outcome: Progressing   Problem: Activity: Goal: Risk for activity intolerance will decrease Outcome: Progressing   Problem: Nutrition: Goal: Adequate nutrition will be maintained Outcome: Progressing   Problem: Coping: Goal: Level of anxiety will decrease Outcome: Progressing   Problem: Elimination: Goal: Will not experience complications related to bowel motility Outcome: Progressing Goal: Will not experience complications related to urinary retention Outcome: Progressing   Problem: Pain Managment: Goal: General experience of comfort will improve Outcome: Progressing   Problem: Safety: Goal: Ability to remain free from injury will improve Outcome: Progressing   Problem: Skin Integrity: Goal: Risk for impaired skin integrity will decrease Outcome: Progressing   Problem: Education: Goal: Understanding of cardiac disease, CV risk reduction, and recovery process will improve Outcome: Progressing Goal: Understanding of medication regimen will improve Outcome: Progressing Goal: Individualized Educational Video(s) Outcome: Progressing   Problem: Activity: Goal: Ability to tolerate increased activity will improve Outcome: Progressing   Problem: Cardiac: Goal:  Ability to achieve and maintain adequate cardiopulmonary perfusion will improve Outcome: Progressing   Problem: Health Behavior/Discharge Planning: Goal: Ability to safely manage health-related needs after discharge will improve Outcome: Progressing   Problem: Education: Goal: Will demonstrate proper wound care and an understanding of methods to prevent future damage Outcome: Progressing Goal: Knowledge of disease or condition will improve Outcome: Progressing Goal: Knowledge of the prescribed therapeutic regimen will improve Outcome: Progressing Goal: Individualized Educational Video(s) Outcome: Progressing   Problem: Activity: Goal: Risk for activity intolerance will decrease Outcome: Progressing   Problem: Cardiac: Goal: Will achieve and/or maintain hemodynamic stability Outcome: Progressing   Problem: Clinical Measurements: Goal: Postoperative complications will be avoided or minimized Outcome: Progressing   Problem: Respiratory: Goal: Respiratory status will improve Outcome: Progressing   Problem: Skin Integrity: Goal: Wound healing without signs and symptoms of infection Outcome: Progressing Goal: Risk for impaired skin integrity will decrease Outcome: Progressing   Problem: Urinary Elimination: Goal: Ability to achieve and maintain adequate renal perfusion and functioning will improve Outcome: Progressing   Problem: Education: Goal: Will demonstrate proper wound care and an understanding of methods to prevent future damage Outcome: Progressing Goal: Knowledge of disease or condition will improve Outcome: Progressing Goal: Knowledge of the prescribed therapeutic regimen will improve Outcome: Progressing Goal: Individualized Educational Video(s) Outcome: Progressing   Problem: Activity: Goal: Risk for activity intolerance will decrease Outcome: Progressing   Problem: Cardiac: Goal: Will achieve and/or maintain hemodynamic stability Outcome:  Progressing   Problem: Clinical Measurements: Goal: Postoperative complications will be avoided or minimized Outcome: Progressing   Problem: Respiratory: Goal: Respiratory status will improve Outcome: Progressing   Problem: Skin Integrity: Goal: Wound healing without signs and symptoms of infection Outcome: Progressing Goal: Risk for impaired skin integrity will decrease Outcome: Progressing   Problem: Urinary Elimination: Goal: Ability to achieve and maintain adequate renal perfusion and functioning will improve Outcome: Progressing

## 2020-06-20 NOTE — Evaluation (Signed)
Physical Therapy Evaluation Patient Details Name: Angela Frazier MRN: [id number] DOB: 1945/10/30 Today's Date: 06/20/2020   History of Present Illness  Pt is a 74 y/o F presenting to the ED on 11/23 for chest pain, nausea, and radiating pain in L arm with NSTEMI. 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 PMH includes HTN, hyperlipidemia, OSA, hypothyroidism, asthma, and DM II with neuropathy.  Clinical Impression  Pt very pleasant sitting in chair on arrival stating having already walked with nursing this morning. Pt educated for sternal precautions with handout provided, transfers, gait and progressive mobility. Pt with decreased strength, activity tolerance, mobility and gait. Pt will benefit from acute therapy to maximize mobility, safety and function adhering to precautions prior to D/C. Pt encouraged to be OOB for meals and walk 3x/day with nursing.     Follow Up Recommendations Home health PT    Equipment Recommendations  Rolling walker with 5" wheels;3in1 (PT)    Recommendations for Other Services       Precautions / Restrictions Precautions Precautions: Sternal Precaution Booklet Issued: Yes (comment) Restrictions Weight Bearing Restrictions: Yes      Mobility  Bed Mobility Overal bed mobility: Needs Assistance Bed Mobility: Sit to Sidelying         Sit to sidelying: Mod assist General bed mobility comments: physical assist to lift legs to surface, cues for sequence    Transfers Overall transfer level: Needs assistance   Transfers: Sit to/from Stand Sit to Stand: Supervision         General transfer comment: cues for hand placement  Ambulation/Gait Ambulation/Gait assistance: Min guard Gait Distance (Feet): 160 Feet Assistive device: Rolling walker (2 wheeled) Gait Pattern/deviations: Step-through pattern;Decreased stride length;Trunk flexed   Gait velocity interpretation: 1.31 - 2.62 ft/sec, indicative of limited community ambulator General Gait  Details: cues for posture and position in RW, cues for breathing technique with standing rest breaks  Stairs            Wheelchair Mobility    Modified Rankin (Stroke Patients Only)       Balance Overall balance assessment: Needs assistance   Sitting balance-Leahy Scale: Good       Standing balance-Leahy Scale: Fair Standing balance comment: use of rW for gait                             Pertinent Vitals/Pain Pain Assessment: No/denies pain    Home Living Family/patient expects to be discharged to:: Private residence Living Arrangements: Alone Available Help at Discharge: Family;Available 24 hours/day   Home Access: Stairs to enter Entrance Stairs-Rails: Right;Left Entrance Stairs-Number of Steps: 3 Home Layout: Two level;Able to live on main level with bedroom/bathroom;Bed/bath upstairs Home Equipment: None Additional Comments: pt normally lives alone at her single story but will stay at son's house at D/C    Prior Function Level of Independence: Independent         Comments: works part time at [employer]        Extremity/Trunk Assessment                Communication   Communication: No difficulties  Cognition Arousal/Alertness: Awake/alert Behavior During Therapy: WFL for tasks assessed/performed Overall Cognitive Status: Within Functional Limits for tasks assessed  General Comments      Exercises     Assessment/Plan    PT Assessment Patient needs continued PT services  PT Problem List Decreased mobility;Decreased activity tolerance;Decreased knowledge of use of DME;Cardiopulmonary status limiting activity       PT Treatment Interventions DME instruction;Gait training;Stair training;Functional mobility training;Therapeutic activities;Patient/family education    PT Goals (Current goals can be found in the Care Plan section)  Acute Rehab PT  Goals Patient Stated Goal: make crafts and play games PT Goal Formulation: With patient Time For Goal Achievement: 07/04/20 Potential to Achieve Goals: Good    Frequency Min 3X/week   Barriers to discharge        Co-evaluation               AM-PAC PT "6 Clicks" Mobility  Outcome Measure Help needed turning from your back to your side while in a flat bed without using bedrails?: A Little Help needed moving from lying on your back to sitting on the side of a flat bed without using bedrails?: A Lot Help needed moving to and from a bed to a chair (including a wheelchair)?: A Little Help needed standing up from a chair using your arms (e.g., wheelchair or bedside chair)?: A Little Help needed to walk in hospital room?: A Little Help needed climbing 3-5 steps with a railing? : A Lot 6 Click Score: 16    End of Session   Activity Tolerance: Patient tolerated treatment well Patient left: in bed;with call bell/phone within reach Nurse Communication: Mobility status PT Visit Diagnosis: Other abnormalities of gait and mobility (R26.89)    Time: 4696-2952 PT Time Calculation (min) (ACUTE ONLY): 30 min   Charges:   PT Evaluation $PT Eval Moderate Complexity: 1 Mod PT Treatments $Therapeutic Activity: 8-22 mins        Cyruss Arata P, PT Acute Rehabilitation Services Pager: [phone] Office: [phone]   Enedina Finner Ryun Velez 06/20/2020, 8:53 AM

## 2020-06-21 ENCOUNTER — Inpatient Hospital Stay (HOSPITAL_COMMUNITY): Payer: Medicare HMO

## 2020-06-21 ENCOUNTER — Encounter (HOSPITAL_COMMUNITY): Payer: Self-pay | Admitting: Interventional Cardiology

## 2020-06-21 LAB — BPAM RBC
Blood Product Expiration Date: 202112252359
Blood Product Expiration Date: 202112252359
Blood Product Expiration Date: 202112252359
Blood Product Expiration Date: 202112252359
ISSUE DATE / TIME: 202111260856
ISSUE DATE / TIME: 202111260856
ISSUE DATE / TIME: 202111260856
ISSUE DATE / TIME: 202111261610
Unit Type and Rh: 6200
Unit Type and Rh: 6200
Unit Type and Rh: 6200
Unit Type and Rh: 6200

## 2020-06-21 LAB — TYPE AND SCREEN
ABO/RH(D): A POS
Antibody Screen: NEGATIVE
Unit division: 0
Unit division: 0
Unit division: 0
Unit division: 0

## 2020-06-21 LAB — BASIC METABOLIC PANEL
Anion gap: 10 (ref 5–15)
BUN: 15 mg/dL (ref 8–23)
CO2: 18 mmol/L — ABNORMAL LOW (ref 22–32)
Calcium: 8.1 mg/dL — ABNORMAL LOW (ref 8.9–10.3)
Chloride: 98 mmol/L (ref 98–111)
Creatinine, Ser: 0.99 mg/dL (ref 0.44–1.00)
GFR, Estimated: >60 mL/min (ref >60)
Glucose, Bld: 219 mg/dL — ABNORMAL HIGH (ref 70–99)
Potassium: 4.7 mmol/L (ref 3.5–5.1)
Sodium: 126 mmol/L — ABNORMAL LOW (ref 135–145)

## 2020-06-21 LAB — CBC
HCT: 31.6 % — ABNORMAL LOW (ref 36.0–46.0)
Hemoglobin: 10.3 g/dL — ABNORMAL LOW (ref 12.0–15.0)
MCH: 28.5 pg (ref 26.0–34.0)
MCHC: 32.6 g/dL (ref 30.0–36.0)
MCV: 87.3 fL (ref 80.0–100.0)
Platelets: 183 10*3/uL (ref 150–400)
RBC: 3.62 MIL/uL — ABNORMAL LOW (ref 3.87–5.11)
RDW: 14.3 % (ref 11.5–15.5)
WBC: 13.7 10*3/uL — ABNORMAL HIGH (ref 4.0–10.5)
nRBC: 0 % (ref 0.0–0.2)

## 2020-06-21 LAB — GLUCOSE, CAPILLARY
Glucose-Capillary: 209 mg/dL — ABNORMAL HIGH (ref 70–99)
Glucose-Capillary: 271 mg/dL — ABNORMAL HIGH (ref 70–99)
Glucose-Capillary: 235 mg/dL — ABNORMAL HIGH (ref 70–99)
Glucose-Capillary: 213 mg/dL — ABNORMAL HIGH (ref 70–99)

## 2020-06-21 MED ORDER — GUAIFENESIN ER 600 MG PO TB12
600.0000 mg | ORAL_TABLET | Freq: Two times a day (BID) | ORAL | Status: DC
  Administered 2020-06-21 – 2020-06-25 (×9): 600 mg via ORAL
  Filled 2020-06-21 (×9): qty 1

## 2020-06-21 MED ORDER — FAMOTIDINE 20 MG PO TABS
10.0000 mg | ORAL_TABLET | Freq: Every day | ORAL | Status: DC
  Administered 2020-06-21 – 2020-06-25 (×5): 10 mg via ORAL
  Filled 2020-06-21 (×5): qty 1

## 2020-06-21 MED ORDER — SPIRONOLACTONE 25 MG PO TABS
25.0000 mg | ORAL_TABLET | Freq: Every day | ORAL | Status: DC
  Administered 2020-06-21 – 2020-06-25 (×5): 25 mg via ORAL
  Filled 2020-06-21 (×5): qty 1

## 2020-06-21 MED ORDER — INSULIN ASPART 100 UNIT/ML ~~LOC~~ SOLN
4.0000 [IU] | Freq: Three times a day (TID) | SUBCUTANEOUS | Status: DC
  Administered 2020-06-21 – 2020-06-25 (×9): 4 [IU] via SUBCUTANEOUS

## 2020-06-21 MED ORDER — METOPROLOL TARTRATE 25 MG PO TABS
25.0000 mg | ORAL_TABLET | Freq: Two times a day (BID) | ORAL | Status: DC
  Administered 2020-06-21 – 2020-06-25 (×8): 25 mg via ORAL
  Filled 2020-06-21 (×8): qty 1

## 2020-06-21 MED ORDER — METOPROLOL TARTRATE 25 MG/10 ML ORAL SUSPENSION
25.0000 mg | Freq: Two times a day (BID) | ORAL | Status: DC
  Filled 2020-06-21 (×9): qty 10

## 2020-06-21 NOTE — Progress Notes (Signed)
CARDIAC REHAB PHASE I   Offered to walk with pt. Pt states recent ambulation with PT, still having loose stools. Stressed importance of ambulation and IS use. Will continue to follow.  Reynold Bowen, RN BSN 06/21/2020 1:48 PM

## 2020-06-21 NOTE — Care Management Important Message (Signed)
Important Message  Patient Details  Name: Angela Frazier MRN: [id number] Date of Birth: [birth year]   Medicare Important Message Given:  Yes     Renie Ora 06/21/2020, 11:05 AM

## 2020-06-21 NOTE — Progress Notes (Signed)
CARDIAC REHAB PHASE I   Offered to walk with pt. Pt declining stating she is trying to finish breakfast, requesting to walk later. Stressed importance of ambulation and IS use. Will continue to follow.   Reynold Bowen, RN BSN 06/21/2020 08:05 AM

## 2020-06-21 NOTE — Evaluation (Signed)
Occupational Therapy Evaluation Patient Details Name: Angela Frazier MRN: [id number] DOB: 1945-08-04 Today's Date: 06/21/2020    History of Present Illness Pt is a 74 y/o F presenting to the ED on 11/23 for chest pain, nausea, and radiating pain in L arm with NSTEMI. 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 PMH includes HTN, hyperlipidemia, OSA, hypothyroidism, asthma, and DM II with neuropathy.   Clinical Impression   PTA, pt was living alone and was independent. Pt planning to dc to her son's home for increased support after sx. Pt currently requiring Mod A for UB ADLs, Max A for LB ADLs, and Min guard A for functional mobility with RW. Pt presenting with limited ROM and decreased activity tolerance impacting her functional performance. Pt would benefit from further acute OT to facilitate safe dc. Recommend dc to home with HHOT for further OT to optimize safety, independence with ADLs, and return to PLOF.     Follow Up Recommendations  Home health OT;Supervision/Assistance - 24 hour    Equipment Recommendations  3 in 1 bedside commode    Recommendations for Other Services PT consult     Precautions / Restrictions Precautions Precautions: Sternal Precaution Booklet Issued: Yes (comment) Precaution Comments: verbal cues required to adhere functionally Restrictions Weight Bearing Restrictions: Yes (sternal precautions)      Mobility Bed Mobility               General bed mobility comments: pt up in chair upon PT arrival    Transfers Overall transfer level: Needs assistance Equipment used: None Transfers: Sit to/from Stand Sit to Stand: Min guard         General transfer comment: verbal cues for hand placement, rocking method, v/c's to place hand on R knee    Balance Overall balance assessment: Needs assistance   Sitting balance-Leahy Scale: Good       Standing balance-Leahy Scale: Fair Standing balance comment: unilateral support on RW                            ADL either performed or assessed with clinical judgement   ADL Overall ADL's : Needs assistance/impaired Eating/Feeding: Set up;Sitting   Grooming: Set up;Sitting   Upper Body Bathing: Moderate assistance;Sitting   Lower Body Bathing: Maximal assistance;Sit to/from stand   Upper Body Dressing : Moderate assistance;Sitting   Lower Body Dressing: Maximal assistance;Sit to/from stand Lower Body Dressing Details (indicate cue type and reason): Pt able to bring ankles to knees for figure four method, but with difficulty. May need AE for LB Dressing Toilet Transfer: Min guard;Ambulation (simulated to recliner) Toilet Transfer Details (indicate cue type and reason): Min Guard A for safety Toileting- Clothing Manipulation and Hygiene: Maximal assistance;Sit to/from stand Toileting - Clothing Manipulation Details (indicate cue type and reason): Initated education on compensatory techniques     Functional mobility during ADLs: Min guard;Rolling walker General ADL Comments: Pt presenting with decreased actvity tolerance as seen by fatigue and shortness of breath during activity     Vision         Perception     Praxis      Pertinent Vitals/Pain Pain Assessment: No/denies pain     Hand Dominance Right   Extremity/Trunk Assessment Upper Extremity Assessment Upper Extremity Assessment: Overall WFL for tasks assessed   Lower Extremity Assessment Lower Extremity Assessment: Overall WFL for tasks assessed   Cervical / Trunk Assessment Cervical / Trunk Assessment: Other exceptions Cervical /  Trunk Exceptions: rounded shoulders   Communication Communication Communication: No difficulties   Cognition Arousal/Alertness: Awake/alert Behavior During Therapy: WFL for tasks assessed/performed Overall Cognitive Status: Within Functional Limits for tasks assessed                                     General Comments  HR 70s. SpO2 90s on RA     Exercises     Shoulder Instructions      Home Living Family/patient expects to be discharged to:: Private residence Living Arrangements: Alone Available Help at Discharge: Family;Available 24 hours/day Type of Home: House Home Access: Stairs to enter [employer] of Steps: 3 Entrance Stairs-Rails: Right;Left Home Layout: Two level;Able to live on main level with bedroom/bathroom;Bed/bath upstairs     Bathroom Shower/Tub: Chief Strategy Officer: Standard     Home Equipment: Emergency planning/management officer - 4 wheels   Additional Comments: Pt planning to dc to her son's house as she lives alone. Above information for son's home      Prior Functioning/Environment Level of Independence: Independent        Comments: ADLs, IADLs, and works part time at [employer]        OT Problem List: Decreased strength;Decreased range of motion;Decreased activity tolerance;Impaired balance (sitting and/or standing);Decreased knowledge of use of DME or AE;Decreased knowledge of precautions;Cardiopulmonary status limiting activity      OT Treatment/Interventions: Self-care/ADL training;Therapeutic exercise;Energy conservation;DME and/or AE instruction;Therapeutic activities;Patient/family education    OT Goals(Current goals can be found in the care plan section) Acute Rehab OT Goals Patient Stated Goal: make crafts and play games OT Goal Formulation: With patient Time For Goal Achievement: 07/05/20 Potential to Achieve Goals: Good  OT Frequency: Min 2X/week   Barriers to D/C:            Co-evaluation              AM-PAC OT "6 Clicks" Daily Activity     Outcome Measure Help from another person eating meals?: None Help from another person taking care of personal grooming?: A Little Help from another person toileting, which includes using toliet, bedpan, or urinal?: A Lot Help from another person bathing (including washing, rinsing, drying)?: A Lot Help from  another person to put on and taking off regular upper body clothing?: A Lot Help from another person to put on and taking off regular lower body clothing?: A Lot 6 Click Score: 15   End of Session Equipment Utilized During Treatment: Engineer, water Communication: Mobility status  Activity Tolerance: Patient tolerated treatment well Patient left: in chair;with call bell/phone within reach  OT Visit Diagnosis: Unsteadiness on feet (R26.81);Other abnormalities of gait and mobility (R26.89);Muscle weakness (generalized) (M62.81)                Time: 9735-3299 OT Time Calculation (min): 20 min Charges:  OT General Charges $OT Visit: 1 Visit OT Evaluation $OT Eval Moderate Complexity: 1 Mod  Jozelynn Danielson MSOT, OTR/L Acute Rehab Pager: [phone] Office: [phone]  Theodoro Grist Deletha Jaffee 06/21/2020, 4:39 PM

## 2020-06-21 NOTE — Progress Notes (Signed)
Physical Therapy Treatment Patient Details Name: Angela Frazier MRN: [id number] DOB: June 19, 1946 Today's Date: 06/21/2020    History of Present Illness Pt is a 74 y/o F presenting to the ED on 11/23 for chest pain, nausea, and radiating pain in L arm with NSTEMI. 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 PMH includes HTN, hyperlipidemia, OSA, hypothyroidism, asthma, and DM II with neuropathy.    PT Comments    Pt with frequent liquid stools today due to taking a stool softener. Pt with 4/4 DOE during ambulation today limiting amb distance. Pt requires use of RW for safe ambulation. Pt dependent for hygiene s/p BM.  Acute PT to cont to follow to progress mobility towards independence. Pt will need 24/7 assist for safe d/c home once medically stable.   Follow Up Recommendations  Home health PT;Supervision/Assistance - 24 hour     Equipment Recommendations  Rolling walker with 5" wheels;3in1 (PT)    Recommendations for Other Services       Precautions / Restrictions Precautions Precautions: Sternal Precaution Booklet Issued: Yes (comment) Precaution Comments: verbal cues required to adhere functionally Restrictions Weight Bearing Restrictions: Yes (sternal precautions)    Mobility  Bed Mobility               General bed mobility comments: pt up in chair upon PT arrival  Transfers Overall transfer level: Needs assistance Equipment used: None Transfers: Sit to/from Stand Sit to Stand: Min guard         General transfer comment: verbal cues for hand placement, rocking method, v/c's to place hand on R knee  Ambulation/Gait Ambulation/Gait assistance: Min guard Gait Distance (Feet): 130 Feet Assistive device: Rolling walker (2 wheeled) Gait Pattern/deviations: Step-through pattern;Decreased stride length;Trunk flexed Gait velocity: slow   General Gait Details: pt very SOB, unable to get a reading on pulse ox, pt stopped 4 times, had to pull mask down, upon  return to room, pt SpO2 67% but not a good wave form, when ready was accurate SpO2 at 99%.   Stairs             Wheelchair Mobility    Modified Rankin (Stroke Patients Only)       Balance Overall balance assessment: Needs assistance   Sitting balance-Leahy Scale: Good       Standing balance-Leahy Scale: Fair Standing balance comment: unilateral support on RW                            Cognition Arousal/Alertness: Awake/alert Behavior During Therapy: WFL for tasks assessed/performed Overall Cognitive Status: Within Functional Limits for tasks assessed                                        Exercises      General Comments General comments (skin integrity, edema, etc.): pt with request to use BSC, pt dependent for hygiene due to sternal precautions and large body habitus      Pertinent Vitals/Pain Pain Assessment: No/denies pain    Home Living                      Prior Function            PT Goals (current goals can now be found in the care plan section) Progress towards PT goals: Progressing toward goals    Frequency  Min 3X/week      PT Plan Current plan remains appropriate    Co-evaluation              AM-PAC PT "6 Clicks" Mobility   Outcome Measure  Help needed turning from your back to your side while in a flat bed without using bedrails?: A Little Help needed moving from lying on your back to sitting on the side of a flat bed without using bedrails?: A Lot Help needed moving to and from a bed to a chair (including a wheelchair)?: A Little Help needed standing up from a chair using your arms (e.g., wheelchair or bedside chair)?: A Little Help needed to walk in hospital room?: A Little Help needed climbing 3-5 steps with a railing? : A Lot 6 Click Score: 16    End of Session   Activity Tolerance: Patient limited by fatigue Patient left: in chair;with call bell/phone within reach;with  nursing/sitter in room Nurse Communication: Mobility status PT Visit Diagnosis: Other abnormalities of gait and mobility (R26.89)     Time: 1241-1310 PT Time Calculation (min) (ACUTE ONLY): 29 min  Charges:  $Gait Training: 8-22 mins $Therapeutic Activity: 8-22 mins                     Lewis Shock, PT, DPT Acute Rehabilitation Services Pager #: [phone] Office #: [phone]    Iona Hansen 06/21/2020, 1:34 PM

## 2020-06-21 NOTE — Progress Notes (Signed)
Inpatient Diabetes Program Recommendations  AACE/ADA: New Consensus Statement on Inpatient Glycemic Control (2015)  Target Ranges:  Prepandial:   less than 140 mg/dL      Peak postprandial:   less than 180 mg/dL (1-2 hours)      Critically ill patients:  140 - 180 mg/dL   Lab Results  Component Value Date   GLUCAP 271 (H) 06/21/2020   HGBA1C 9.1 (H) 06/17/2020    Review of Glycemic Control Results for Angela Frazier, Angela Frazier (MRN [id number]) as of 06/21/2020 13:13  Ref. Range 06/20/2020 07:53 06/20/2020 09:20 06/20/2020 10:55 06/20/2020 11:54 06/20/2020 14:47 06/20/2020 16:19 06/20/2020 21:30 06/21/2020 05:13 06/21/2020 11:09  Glucose-Capillary Latest Ref Range: 70 - 99 mg/dL 592 (H) 924 (H) 462 (H) 116 (H) 172 (H) 176 (H) 239 (H) 209 (H) 271 (H)   Diabetes history: DM 2 Outpatient Diabetes medications:  Metformin XR 1000 mg bid Novolog 70/30 mix 0-70 units bid Current orders for Inpatient glycemic control:  Levemir 40 units daily Novolog moderate tid with meals and HS Inpatient Diabetes Program Recommendations:    Please add Novolog 4 units tid with meals (hold if patient eats less than 50%).   Thanks  Beryl Meager, RN, BC-ADM Inpatient Diabetes Coordinator Pager [phone] (8a-5p)

## 2020-06-21 NOTE — Progress Notes (Signed)
Mobility Specialist - Progress Note   06/21/20 1423  Mobility  Activity Refused mobility   Refused due to feeling fatigued from recent PT session.   Mamie Levers Mobility Specialist Mobility Specialist Phone: [phone]

## 2020-06-21 NOTE — Progress Notes (Addendum)
[address]       [place] [address]             [phone]      3 Days Post-Op Procedure(s) (LRB): CORONARY ARTERY BYPASS GRAFTING (CABG), ON PUMP, TIMES FOUR, USING LEFT INTERNAL MAMMARY ARTERY, ENDOSCOPICALLY HARVESTED RIGHT GREATER SAPHENOUS VEIN, AND LEFT RADIAL ARTERY (OPEN HARVEST) (N/A) RADIAL ARTERY HARVEST (Left) TRANSESOPHAGEAL ECHOCARDIOGRAM (TEE) (N/A) INDOCYANINE GREEN FLUORESCENCE IMAGING (ICG) (N/A) Subjective: Feels okay this morning. She did have some shortness of breath getting in and out of bed.  Objective: Vital signs in last 24 hours: Temp:  [97.9 F (36.6 C)-99.1 F (37.3 C)] 97.9 F (36.6 C) (11/29 0326) Pulse Rate:  [62-79] 67 (11/29 0326) Cardiac Rhythm: Normal sinus rhythm (11/28 1900) Resp:  [18-26] 20 (11/29 0326) BP: (125-144)/(67-96) 137/96 (11/29 0326) SpO2:  [95 %-100 %] 96 % (11/29 0326) Arterial Line BP: (101-193)/(63-95) 113/92 (11/28 1400) Weight:  [112.9 kg] 112.9 kg (11/29 0442)     Intake/Output from previous day: 11/28 0701 - 11/29 0700 In: 164.2 [I.V.:164.2] Out: 300 [Urine:300] Intake/Output this shift: No intake/output data recorded.  General appearance: alert, cooperative and no distress Heart: regular rate and rhythm, S1, S2 normal, no murmur, click, rub or gallop Lungs: clear to auscultation bilaterally Abdomen: soft, non-tender; bowel sounds normal; no masses,  no organomegaly Extremities: extremities normal, atraumatic, no cyanosis or edema Wound: clean and dry covered with a prevena dressing  Lab Results: Recent Labs    06/20/20 0419 06/21/20 0218  WBC 11.9* 13.7*  HGB 9.7* 10.3*  HCT 30.2* 31.6*  PLT 139* 183   BMET:  Recent Labs    06/20/20 0419 06/21/20 0218  NA 130* 126*  K 4.1 4.7  CL 99 98  CO2 21* 18*  GLUCOSE 124* 219*  BUN 13 15  CREATININE 0.88 0.99  CALCIUM 8.0* 8.1*    PT/INR:  Recent Labs    06/18/20 1345  LABPROT 17.0*  INR 1.4*   ABG    Component  Value Date/Time   PHART 7.269 (L) 06/18/2020 1828   HCO3 21.8 06/18/2020 1828   TCO2 23 06/18/2020 1828   ACIDBASEDEF 5.0 (H) 06/18/2020 1828   O2SAT 48.9 06/20/2020 1106   CBG (last 3)  Recent Labs    06/20/20 1619 06/20/20 2130 06/21/20 0513  GLUCAP 176* 239* 209*    Assessment/Plan: S/P Procedure(s) (LRB): CORONARY ARTERY BYPASS GRAFTING (CABG), ON PUMP, TIMES FOUR, USING LEFT INTERNAL MAMMARY ARTERY, ENDOSCOPICALLY HARVESTED RIGHT GREATER SAPHENOUS VEIN, AND LEFT RADIAL ARTERY (OPEN HARVEST) (N/A) RADIAL ARTERY HARVEST (Left) TRANSESOPHAGEAL ECHOCARDIOGRAM (TEE) (N/A) INDOCYANINE GREEN FLUORESCENCE IMAGING (ICG) (N/A)  1. CV-NSR in the 60s, On asa, statin, lopressor, and amio PO. Continue isordil for radial artery harvest. Milrinone has been stopped, will discontinue order.  2. Pulm-on room air with good saturation. Off and on shortness of breath per the patient. + cough with sputum, will add mucinex.  3. Renal-creatinine 0.99, electrolytes okay. Remains 10lbs above baseline weight. Not on lasix due to prolonged QT interval. 4. H and H stable 10.3/31.6, stable 5. GI- no n/v, eating small amounts 6. Blood glucose on the high side  Plan: Continue ambulation in the halls. No more episodes of atrial fibrillation. Encourage use of incentive spirometer. Will change Pepcid IV to PO. Will add back her spirolactone. Will order a EKG for tomorrow morning to monitor QTc.    LOS: 5 days    Sharlene Dory 06/21/2020 Agree with documentation;  d/c colchicine due to diarrhea. Nearing readiness for discharge  Marjorie Lussier Z. Vickey Sages, MD [phone]

## 2020-06-22 DIAGNOSIS — E782 Mixed hyperlipidemia: Secondary | ICD-10-CM | POA: Diagnosis not present

## 2020-06-22 DIAGNOSIS — I214 Non-ST elevation (NSTEMI) myocardial infarction: Secondary | ICD-10-CM | POA: Diagnosis not present

## 2020-06-22 DIAGNOSIS — E1165 Type 2 diabetes mellitus with hyperglycemia: Secondary | ICD-10-CM | POA: Diagnosis not present

## 2020-06-22 DIAGNOSIS — E669 Obesity, unspecified: Secondary | ICD-10-CM | POA: Diagnosis not present

## 2020-06-22 LAB — BASIC METABOLIC PANEL
Anion gap: 12 (ref 5–15)
BUN: 16 mg/dL (ref 8–23)
CO2: 22 mmol/L (ref 22–32)
Calcium: 8.6 mg/dL — ABNORMAL LOW (ref 8.9–10.3)
Chloride: 99 mmol/L (ref 98–111)
Creatinine, Ser: 1.09 mg/dL — ABNORMAL HIGH (ref 0.44–1.00)
GFR, Estimated: 54 mL/min — ABNORMAL LOW (ref >60)
Glucose, Bld: 119 mg/dL — ABNORMAL HIGH (ref 70–99)
Potassium: 3.9 mmol/L (ref 3.5–5.1)
Sodium: 133 mmol/L — ABNORMAL LOW (ref 135–145)

## 2020-06-22 LAB — GLUCOSE, CAPILLARY
Glucose-Capillary: 112 mg/dL — ABNORMAL HIGH (ref 70–99)
Glucose-Capillary: 100 mg/dL — ABNORMAL HIGH (ref 70–99)
Glucose-Capillary: 120 mg/dL — ABNORMAL HIGH (ref 70–99)
Glucose-Capillary: 158 mg/dL — ABNORMAL HIGH (ref 70–99)

## 2020-06-22 MED ORDER — FUROSEMIDE 40 MG PO TABS
40.0000 mg | ORAL_TABLET | Freq: Every day | ORAL | Status: DC
  Administered 2020-06-22: 40 mg via ORAL
  Filled 2020-06-22: qty 1

## 2020-06-22 MED ORDER — AMIODARONE HCL 200 MG PO TABS
200.0000 mg | ORAL_TABLET | Freq: Two times a day (BID) | ORAL | Status: DC
  Administered 2020-06-22 – 2020-06-25 (×7): 200 mg via ORAL
  Filled 2020-06-22 (×7): qty 1

## 2020-06-22 MED FILL — Mannitol IV Soln 20%: INTRAVENOUS | Qty: 500 | Status: AC

## 2020-06-22 MED FILL — Potassium Chloride Inj 2 mEq/ML: INTRAVENOUS | Qty: 40 | Status: AC

## 2020-06-22 MED FILL — Calcium Chloride Inj 10%: INTRAVENOUS | Qty: 10 | Status: AC

## 2020-06-22 MED FILL — Magnesium Sulfate Inj 50%: INTRAMUSCULAR | Qty: 10 | Status: AC

## 2020-06-22 MED FILL — Heparin Sodium (Porcine) Inj 1000 Unit/ML: INTRAMUSCULAR | Qty: 30 | Status: AC

## 2020-06-22 MED FILL — Sodium Chloride IV Soln 0.9%: INTRAVENOUS | Qty: 2000 | Status: AC

## 2020-06-22 MED FILL — Electrolyte-R (PH 7.4) Solution: INTRAVENOUS | Qty: 4000 | Status: AC

## 2020-06-22 MED FILL — Heparin Sodium (Porcine) Inj 1000 Unit/ML: INTRAMUSCULAR | Qty: 10 | Status: AC

## 2020-06-22 MED FILL — Sodium Bicarbonate IV Soln 8.4%: INTRAVENOUS | Qty: 50 | Status: AC

## 2020-06-22 NOTE — Progress Notes (Signed)
      [address]       [place] [address]             [phone]      4 Days Post-Op Procedure(s) (LRB): CORONARY ARTERY BYPASS GRAFTING (CABG), ON PUMP, TIMES FOUR, USING LEFT INTERNAL MAMMARY ARTERY, ENDOSCOPICALLY HARVESTED RIGHT GREATER SAPHENOUS VEIN, AND LEFT RADIAL ARTERY (OPEN HARVEST) (N/A) RADIAL ARTERY HARVEST (Left) TRANSESOPHAGEAL ECHOCARDIOGRAM (TEE) (N/A) INDOCYANINE GREEN FLUORESCENCE IMAGING (ICG) (N/A) Subjective: Feels okay today. She does have some shortness of breath with ambulation.   Objective: Vital signs in last 24 hours: Temp:  [97.2 F (36.2 C)-98.5 F (36.9 C)] 97.6 F (36.4 C) (11/30 0425) Pulse Rate:  [56-69] 56 (11/30 0425) Cardiac Rhythm: Normal sinus rhythm (11/29 1900) Resp:  [18-20] 18 (11/30 0025) BP: (136-175)/(69-89) 154/73 (11/30 0425) SpO2:  [96 %-99 %] 96 % (11/30 0425) Weight:  [113.1 kg] 113.1 kg (11/30 0425)     Intake/Output from previous day: 11/29 0701 - 11/30 0700 In: 426.2 [P.O.:240; I.V.:186.2] Out: 350 [Urine:350] Intake/Output this shift: No intake/output data recorded.  General appearance: alert, cooperative and no distress Heart: sinus bradycardia Lungs: clear to auscultation bilaterally Abdomen: soft, non-tender; bowel sounds normal; no masses,  no organomegaly Extremities: extremities normal, atraumatic, no cyanosis or edema Wound: clean and dry  Lab Results: Recent Labs    06/20/20 0419 06/21/20 0218  WBC 11.9* 13.7*  HGB 9.7* 10.3*  HCT 30.2* 31.6*  PLT 139* 183   BMET:  Recent Labs    06/21/20 0218 06/22/20 0032  NA 126* 133*  K 4.7 3.9  CL 98 99  CO2 18* 22  GLUCOSE 219* 119*  BUN 15 16  CREATININE 0.99 1.09*  CALCIUM 8.1* 8.6*    PT/INR: No results for input(s): LABPROT, INR in the last 72 hours. ABG    Component Value Date/Time   PHART 7.269 (L) 06/18/2020 1828   HCO3 21.8 06/18/2020 1828   TCO2 23 06/18/2020 1828   ACIDBASEDEF 5.0 (H) 06/18/2020 1828   O2SAT  48.9 06/20/2020 1106   CBG (last 3)  Recent Labs    06/21/20 1631 06/21/20 2027 06/22/20 0644  GLUCAP 235* 213* 112*    Assessment/Plan: S/P Procedure(s) (LRB): CORONARY ARTERY BYPASS GRAFTING (CABG), ON PUMP, TIMES FOUR, USING LEFT INTERNAL MAMMARY ARTERY, ENDOSCOPICALLY HARVESTED RIGHT GREATER SAPHENOUS VEIN, AND LEFT RADIAL ARTERY (OPEN HARVEST) (N/A) RADIAL ARTERY HARVEST (Left) TRANSESOPHAGEAL ECHOCARDIOGRAM (TEE) (N/A) INDOCYANINE GREEN FLUORESCENCE IMAGING (ICG) (N/A)  1. CV-NSR in the 50s, On asa, statin, lopressor (holding parameters in place), and amio PO. Decrease Amio to 200mg  BID due to bradycardia. Continue isordil for radial artery harvest. Off milrinone.  2. Pulm-on room air with good saturation. Off and on shortness of breath per the patient. + cough with sputum, will add mucinex. Atelectasis on CXR. Continue IS. 3. Renal-creatinine 1.09, electrolytes okay. Remains 10lbs above baseline weight. Not on lasix due to prolonged QT interval. 4. H and H stable 10.3/31.6, stable 5. GI- no n/v, eating small amounts 6. Blood glucose on the high side-changes made yesterday  Plan: Prevena removed this morning. Sternal incision looks good. Amio decreased to 200mg  BID. Keep EPW due to bradycardia. Weight remains 11kg over baseline. We may need to add some lasix, ordered EKG this morning to look at prolonged QT. Encouraged to ambulate in the halls and use her incentive spirometer.    LOS: 6 days    04-25-1972 06/22/2020

## 2020-06-22 NOTE — Progress Notes (Signed)
CARDIAC REHAB PHASE I   PRE:  Rate/Rhythm: 56 SB  BP:  Supine:   Sitting: 120/62  Standing:    SaO2: 96%RA  MODE:  Ambulation: 140 ft   POST:  Rate/Rhythm: 76 SR  BP:  Supine:   Sitting: 165/83  Standing:    SaO2: 95%RA hall and 94%RA room 0947-1015 Pt receptive to walking. Pt walked 140 ft on RA with rolling walker and minimal asst. Pt stated distance is limited by her shortness of breath and mask seems to worsen it. Sats good on RA in hallway.  To recliner with call bell. Encouraged IS and more walks.   Luetta Nutting, RN BSN  06/22/2020 10:12 AM

## 2020-06-22 NOTE — Progress Notes (Signed)
Occupational Therapy Treatment Patient Details Name: Angela Frazier MRN: [id number] DOB: April 03, 1946 Today's Date: 06/22/2020    History of present illness Pt is a 74 y/o F presenting to the ED on 11/23 for chest pain, nausea, and radiating pain in L arm with NSTEMI. 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 PMH includes HTN, hyperlipidemia, OSA, hypothyroidism, asthma, and DM II with neuropathy.   OT comments  Patient continues to make steady progress towards goals in skilled OT session. Patient's session encompassed further education with regard to AE equipment for home to aide in dressing and toileting. Therapist provided demonstration with long handled sponge, reacher, and toilet tongs with verbal acknowledgement from patient. Patient able to verbally walk through appropriate sequences with devices after demonstration with therapist (adhering to sternal precautions as well). Handout provided at end of session to allow son or pt to procure items for safe discharge home. Pt handed off to cardiac rehab at end of session; therapy to continue to follow.    Follow Up Recommendations  Home health OT;Supervision/Assistance - 24 hour    Equipment Recommendations  3 in 1 bedside commode    Recommendations for Other Services      Precautions / Restrictions Precautions Precautions: Sternal Precaution Booklet Issued: Yes (comment) Precaution Comments: reviewed verbally with patient       Mobility Bed Mobility               General bed mobility comments: up in chair upon OTA arrival  Transfers                      Balance     Sitting balance-Leahy Scale: Good                                     ADL either performed or assessed with clinical judgement   ADL Overall ADL's : Needs assistance/impaired                                       General ADL Comments: Session focus on AE equipment education for safe discharge to sons home      Vision       Perception     Praxis      Cognition Arousal/Alertness: Awake/alert Behavior During Therapy: WFL for tasks assessed/performed Overall Cognitive Status: Within Functional Limits for tasks assessed                                          Exercises     Shoulder Instructions       General Comments      Pertinent Vitals/ Pain       Pain Assessment: No/denies pain  Home Living                                          Prior Functioning/Environment              Frequency  Min 2X/week        Progress Toward Goals  OT Goals(current goals can now be found in the care plan section)  Progress towards  OT goals: Progressing toward goals  Acute Rehab OT Goals Patient Stated Goal: to get stronger OT Goal Formulation: With patient Time For Goal Achievement: 07/05/20 Potential to Achieve Goals: Good  Plan Discharge plan remains appropriate    Co-evaluation                 AM-PAC OT "6 Clicks" Daily Activity     Outcome Measure   Help from another person eating meals?: None Help from another person taking care of personal grooming?: A Little Help from another person toileting, which includes using toliet, bedpan, or urinal?: A Lot Help from another person bathing (including washing, rinsing, drying)?: A Lot Help from another person to put on and taking off regular upper body clothing?: A Lot Help from another person to put on and taking off regular lower body clothing?: A Lot 6 Click Score: 15    End of Session    OT Visit Diagnosis: Unsteadiness on feet (R26.81);Other abnormalities of gait and mobility (R26.89);Muscle weakness (generalized) (M62.81)   Activity Tolerance Patient tolerated treatment well   Patient Left in chair;with call bell/phone within reach;Other (comment) (cardiac rehab in room)   Nurse Communication Mobility status        Time: [phone] OT Time Calculation (min): 18  min  Charges: OT General Charges $OT Visit: 1 Visit OT Treatments $Self Care/Home Management : 8-22 mins  Pollyann Glen E. Alyanna Stoermer, COTA/L Acute Rehabilitation Services [phone]   Cherlyn Cushing 06/22/2020, 3:52 PM

## 2020-06-22 NOTE — TOC Initial Note (Signed)
Transition of Care (TOC) - Initial/Assessment Note  Donn Pierini RN, BSN Transitions of Care Unit 4E- RN Case Manager See Treatment Team for direct phone #    Patient Details  Name: Angela Frazier MRN: [id number] Date of Birth: [birth year]  Transition of Care [facility]) CM/SW Contact:    Darrold Span, RN Phone Number: 06/22/2020, 4:45 PM  Clinical Narrative:                 Pt s/p CABG, from home, order placed for HHPT and DME needs- CM spoke with pt at bedside pt reports she has a RW at home and does not need a new one- agreeable to 3n1 for home and will use in house provider for delivery to room. Discussed HH- pt provided list for [facility] choice- Per CMS guidelines from medicare.gov website with star ratings (copy placed in shadow chart)- per pt she does not have  A preference and defers to this Clinical research associate to secure agency- agreeable to checking with [facility] first and [facility] second.  Pt does report she will be staying at her son's home here in [place] for several weeks- does not have address but will provide it tomorrow. Pt's address confirmed in epic along with phone #- pt's mobile # is [phone] and her PCP is Tilford Pillar in [facility].   Call made to [employer] line for DME referral- 3n1 to be delivered to room prior to discharge  Call made to [facility] for HHPT referral- awaiting return call to confirm they can accept.   Expected Discharge Plan: Home w Home Health Services Barriers to Discharge: Continued Medical Work up   Patient Goals and CMS Choice Patient states their goals for this hospitalization and ongoing recovery are:: return home, get stronger CMS Medicare.gov Compare Post Acute Care list provided to:: Patient Choice offered to / list presented to : Patient  Expected Discharge Plan and Services Expected Discharge Plan: Home w Home Health Services   Discharge Planning Services: CM Consult Post Acute Care Choice: Durable Medical Equipment, Home Health Living  arrangements for the past 2 months: Single Family Home                 DME Arranged: 3-N-1, Walker rolling DME Agency: [employer] Date DME Agency Contacted: 06/22/20 Time DME Agency Contacted: [phone] Representative spoke with at DME Agency: Silvio Pate HH Arranged: PT   Date HH Agency Contacted: 06/22/20 Time HH Agency Contacted: 1641    Prior Living Arrangements/Services Living arrangements for the past 2 months: Single Family Home Lives with:: Self Patient language and need for interpreter reviewed:: Yes Do you feel safe going back to the place where you live?: Yes      Need for Family Participation in Patient Care: Yes (Comment) Care giver support system in place?: Yes (comment)   Criminal Activity/Legal Involvement Pertinent to Current Situation/Hospitalization: No - Comment as needed  Activities of Daily Living Home Assistive Devices/Equipment: CBG Meter, Eyeglasses ADL Screening (condition at time of admission) Patient's cognitive ability adequate to safely complete daily activities?: Yes Is the patient deaf or have difficulty hearing?: No Does the patient have difficulty seeing, even when wearing glasses/contacts?: No Does the patient have difficulty concentrating, remembering, or making decisions?: No Patient able to express need for assistance with ADLs?: Yes Does the patient have difficulty dressing or bathing?: No Independently performs ADLs?: Yes (appropriate for developmental age) Does the patient have difficulty walking or climbing stairs?: No Weakness of Legs: None Weakness of Arms/Hands: None  Permission Sought/Granted  Permission sought to share information with : Oceanographer granted to share information with : Yes, Verbal Permission Granted     Permission granted to share info w AGENCY: HH agency        Emotional Assessment Appearance:: Appears stated age Attitude/Demeanor/Rapport: Engaged Affect (typically observed):  Appropriate Orientation: : Oriented to Self, Oriented to Place, Oriented to  Time, Oriented to Situation Alcohol / Substance Use: Not Applicable Psych Involvement: No (comment)  Admission diagnosis:  NSTEMI (non-ST elevated myocardial infarction) (HCC) [I21.4] S/P CABG x 4 [Z95.1] Patient Active Problem List   Diagnosis Date Noted  . S/P CABG x 4 06/18/2020  . Chest pain 06/16/2020  . Nausea 06/16/2020  . Obesity (BMI 30-39.9) 06/16/2020  . Essential hypertension 06/16/2020  . Hyperlipidemia 06/16/2020  . Hypothyroidism 06/16/2020  . Asthma, chronic, unspecified asthma severity, with acute exacerbation 06/16/2020  . Hyperglycemia due to diabetes mellitus (HCC) 06/16/2020  . Leukocytosis 06/16/2020  . Prolonged QT interval 06/16/2020  . NSTEMI (non-ST elevated myocardial infarction) (HCC) 06/15/2020   PCP:  System, Provider Not In Pharmacy:   [facility] - [place], [place] - [address] [place] [address] Phone: [phone] Fax: [phone]     Social Determinants of Health (SDOH) Interventions    Readmission Risk Interventions No flowsheet data found.

## 2020-06-22 NOTE — Progress Notes (Signed)
PT Cancellation Note  Patient Details Name: Angela Frazier MRN: [id number] DOB: April 03, 1946   Cancelled Treatment:    Reason Eval/Treat Not Completed: (P) Patient at procedure or test/unavailable (RN into pull pacing wires, pt will be on bed rest x 1 hour, will f/u per POC.)   Patra Gherardi Artis Delay 06/22/2020, 2:36 PM  Bonney Leitz , PTA Acute Rehabilitation Services Pager [phone] Office [phone]

## 2020-06-22 NOTE — Discharge Summary (Signed)
Physician Discharge Summary  Patient ID: Angela Frazier MRN: [id number] DOB/AGE: 1946/03/27 74 y.o.  Admit date: 06/15/2020 Discharge date: 06/25/2020  Admission Diagnoses:  Non-ST elevation myocardial infarction Multivessel coronary artery disease Obesity Hypertension Dyslipidemia History of chronic asthma History of prolonged QT interval  Discharge Diagnoses:  Principal Problem:   NSTEMI (non-ST elevated myocardial infarction) (HCC) Active Problems:   Chest pain   Nausea   Obesity (BMI 30-39.9)   Essential hypertension   Hyperlipidemia   Hypothyroidism   Asthma, chronic, unspecified asthma severity, with acute exacerbation   Hyperglycemia due to diabetes mellitus (HCC)   Leukocytosis   Prolonged QT interval   S/P CABG x 4   Discharged Condition: good  History of Present Illness:       74 yo lady with HTN, HL, DM and OSA experienced sudden, relatively mild chest pain but it was severe and persistent enough to get her attention. Called 911 who suggested ED visit. There, NSTEMI dx'd. 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 Consult for CABG. Stable sx on heparin. Denies CP or SOB.  Hospital Course:   Angela Frazier remained stable following left heart catheterization.  She decided to proceed with surgery.  She was taken to the OR on 06/18/2020 where CABG x3 was performed.  Please see the operative note below.  She separated from cardiopulmonary bypass without difficulty and was transferred to the ICU in stable condition on low-dose inotropic support.  She remained hemodynamically stable.  She was weaned from the mechanical ventilator and extubated in the early evening following surgery.  She was mobilized with physical therapy on the first postoperative day.  Glucose was monitored and sliding scale insulin was provided as required.  She was also seen by the diabetes coordinator and recommendations were made for inpatient management.  She developed  atrial fibrillation postoperatively and was loaded with amiodarone.  This resulted in conversion back to sinus rhythm but with a relatively slow rate.  She was evaluated by both the occupational therapy and physical therapy teams and was felt to be appropriate for eventual discharge to home with home health services.  Equipment recommendations included a rolling walker and a 3 and 1 bedside commode.  Arrangements were made to provide these at the time of discharge.  She was transferred to 4 E. progressive care on postop day 2.  She continued to make slow progress with mobility.  She continued to have normal sinus rhythm with sinus bradycardia in the 50s.  An oral nitrate was also continued postoperatively for optimal radial artery graft patency.  In addition, she was started on oral Plavix postoperatively for preop diagnosis of non-ST elevation myocardial infarction.She continued diuretics on the floor for several days. Her shortness of breath continued with ambulation but was improving. Today, her incisions are healing well, she is on room air with good oxygen saturation, and tolerating ambulation. She is ready for discharge home today and she is going to stay with her son.     Consults: cardiology  Significant Diagnostic Studies:   LEFT HEART CATH AND CORONARY ANGIOGRAPHY  Conclusion    Dist RCA lesion is 100% stenosed. Left to right collaterals. Mid RCA lesion is 50% stenosed. Prox Cx lesion is 80% stenosed. Mid Cx lesion is 80% stenosed. 1st Mrg lesion is 75% stenosed. This is a small vessel and may not be a target for CABG. The OM2 is a large vessel that is likely a better bypass target. Ost LAD to  Prox LAD lesion is 90% stenosed. There is moderate left ventricular systolic dysfunction. LV end diastolic pressure is moderately elevated. The left ventricular ejection fraction is 35-45% by visual estimate. There is no aortic valve stenosis. RPDA lesion is 80% stenosed. RPAV lesion is 80%  stenosed.   Severe three vessel disease.   Will obtain CVTS consult for CABG.  Medical therapy for LV dysfunction.   Likely will need grafts to LAD, OM2, PDA, PLA Wall Motion  Resting               Left Heart  Left Ventricle The left ventricular size is normal. There is moderate left ventricular systolic dysfunction. LV end diastolic pressure is moderately elevated. The left ventricular ejection fraction is 35-45% by visual estimate. There are LV function abnormalities due to segmental dysfunction.  Aortic Valve There is no aortic valve stenosis.  Coronary Diagrams  Diagnostic Dominance: Right       ECHOCARDIOGRAM REPORT     Patient Name:   Angela Frazier Date of Exam: 06/16/2020  Medical Rec #:  [id number]    Height:       65.0 in  Accession #:    [id number]   Weight:       226.6 lb  Date of Birth:  09/13/1945     BSA:          2.086 m  Patient Age:    74 years     BP:           129/65 mmHg  Patient Gender: F            HR:           69 bpm.  Exam Location:  Inpatient   Procedure: 2D Echo, 3D Echo, Color Doppler, Cardiac Doppler and  Intracardiac             Opacification Agent   Indications:    Acute Coronary Syndrome i24.9     History:        Patient has no prior history of Echocardiogram  examinations.                  NSTEMI; Risk Factors:Hypertension, Diabetes and  Dyslipidemia.     Sonographer:    Angela Frazier  Referring Phys: [phone] [address] Frazier   IMPRESSIONS     1. Left ventricular ejection fraction, by estimation, is 35 to 40%. The  left ventricle has moderately decreased function. The left ventricle  demonstrates regional wall motion abnormalities (see scoring  diagram/findings for description). There is mild  concentric left ventricular hypertrophy. Left ventricular diastolic  parameters are consistent with Grade II diastolic dysfunction  (pseudonormalization). Elevated left ventricular end-diastolic pressure.   2. Right ventricular systolic  function is normal. The right ventricular  size is normal. There is mildly elevated pulmonary artery systolic  pressure.   3. Left atrial size was mildly dilated.   4. Right atrial size was mildly dilated.   5. The mitral valve is normal in structure. Trivial mitral valve  regurgitation. No evidence of mitral stenosis.   6. The aortic valve is grossly normal. There is mild calcification of the  aortic valve. Aortic valve regurgitation is not visualized. No aortic  stenosis is present.   7. The inferior vena cava is dilated in size with <50% respiratory  variability, suggesting right atrial pressure of 15 mmHg.   Conclusion(s)/Recommendation(s): Reduced LVEF, focal wall motion  abnormalities as noted. No LV thrombus seen with contrast.  FINDINGS   Left Ventricle: There is an area of concern for thrombus at the apex, but  it does not fully exclude contrast with definity. Therefore, not  consistent with LV thrombus. Left ventricular ejection fraction, by  estimation, is 35 to 40%. The left ventricle  has moderately decreased function. The left ventricle demonstrates  regional wall motion abnormalities. The left ventricular internal cavity  size was normal in size. There is mild concentric left ventricular  hypertrophy. Left ventricular diastolic  parameters are consistent with Grade II diastolic dysfunction  (pseudonormalization). Elevated left ventricular end-diastolic pressure.      LV Wall Scoring:  The mid and distal anterior wall and apex are akinetic. The mid and distal  lateral wall, mid and distal anterior septum, entire inferior wall, mid  anterolateral segment, mid inferoseptal segment, and basal anterior  segment  are hypokinetic. The basal anteroseptal segment, basal inferolateral  segment,  basal anterolateral segment, and basal inferoseptal segment are normal.   Right Ventricle: The right ventricular size is normal. No increase in  right ventricular wall thickness.  Right ventricular systolic function is  normal. There is mildly elevated pulmonary artery systolic pressure. The  tricuspid regurgitant velocity is 2.58   m/s, and with an assumed right atrial pressure of 15 mmHg, the estimated  right ventricular systolic pressure is 41.6 mmHg.   Left Atrium: Left atrial size was mildly dilated.   Right Atrium: Right atrial size was mildly dilated.   Pericardium: Trivial pericardial effusion is present. Presence of  pericardial fat pad.   Mitral Valve: The mitral valve is normal in structure. Trivial mitral  valve regurgitation. No evidence of mitral valve stenosis.   Tricuspid Valve: The tricuspid valve is normal in structure. Tricuspid  valve regurgitation is trivial. No evidence of tricuspid stenosis.   Aortic Valve: The aortic valve is grossly normal. There is mild  calcification of the aortic valve. Aortic valve regurgitation is not  visualized. No aortic stenosis is present.   Pulmonic Valve: The pulmonic valve was not well visualized. Pulmonic valve  regurgitation is trivial. No evidence of pulmonic stenosis.   Aorta: The aortic root and ascending aorta are structurally normal, with  no evidence of dilitation.   Venous: The inferior vena cava is dilated in size with less than 50%  respiratory variability, suggesting right atrial pressure of 15 mmHg.   IAS/Shunts: The atrial septum is grossly normal.      LEFT VENTRICLE  PLAX 2D  LVIDd:         4.70 cm      Diastology  LVIDs:         3.10 cm      LV e' medial:    3.81 cm/s  LV PW:         1.20 cm      LV E/e' medial:  31.2  LV IVS:        1.30 cm      LV e' lateral:   3.59 cm/s  LVOT diam:     2.10 cm      LV E/e' lateral: 33.1  LV SV:         60  LV SV Index:   29  LVOT Area:     3.46 cm     LV Volumes (MOD)  LV vol d, MOD A2C: 105.0 ml  LV vol d, MOD A4C: 96.6 ml  LV vol s, MOD A2C: 64.1 ml  LV vol s, MOD A4C: 50.8 ml  LV  SV MOD A2C:     40.9 ml  LV SV MOD A4C:     96.6 ml   LV SV MOD BP:      43.2 ml   RIGHT VENTRICLE  RV S prime:     8.59 cm/s  TAPSE (M-mode): 2.3 cm   LEFT ATRIUM             Index       RIGHT ATRIUM           Index  LA diam:        4.10 cm 1.97 cm/m  RA Area:     17.40 cm  LA Vol (A2C):   52.1 ml 24.98 ml/m RA Volume:   49.40 ml  23.68 ml/m  LA Vol (A4C):   51.6 ml 24.74 ml/m  LA Biplane Vol: 55.7 ml 26.70 ml/m   AORTIC VALVE  LVOT Vmax:   75.70 cm/s  LVOT Vmean:  58.300 cm/s  LVOT VTI:    0.172 m     AORTA  Ao Root diam: 2.80 cm  Ao Asc diam:  3.50 cm   MITRAL VALVE                TRICUSPID VALVE  MV Area (PHT): 3.31 cm     TR Peak grad:   26.6 mmHg  MV Decel Time: 229 msec     TR Vmax:        258.00 cm/s  MV E velocity: 119.00 cm/s  MV A velocity: 111.00 cm/s  SHUNTS  MV E/A ratio:  1.07         Systemic VTI:  0.17 m                              Systemic Diam: 2.10 cm   Jodelle Red MD  Electronically signed by Jodelle Red MD  Signature Date/Time: 06/16/2020/7:15:49 PM      Treatments:   CARDIOTHORACIC SURGERY OPERATIVE NOTE   Date of Procedure:    06/18/2020   Preoperative Diagnosis:      Severe 3-vessel Coronary Artery Disease, s/p NSTEMI   Postoperative Diagnosis:    Same   Procedure:        Coronary Artery Bypass Grafting x 4              Left Internal Mammary Artery to Distal Left Anterior Descending Coronary Artery; Saphenous Vein Graft to Posterior Descending Coronary Artery; Saphenous Vein Graft to 2nd Obtuse Marginal Branch of Left Circumflex Coronary Artery; left radial artery graft to right Posterolateral Coronary Artery; Endoscopic Vein Harvest from right thigh  Open left radial artery harvesting Completion graft surveillance with indocyanine green fluorescence imaging (SPY)   Surgeon:        B. Micheline Maze, MD   Assistant:       Gaynelle Arabian, PA-C   Anesthesia:    get   Operative Findings: Mildly reduced left ventricular systolic function good quality left internal  mammary artery conduit good quality saphenous vein conduit and radial artery conduit good quality target vessels for grafting       BRIEF CLINICAL NOTE AND INDICATIONS FOR SURGERY   74 yo lady presented with NSTEMI; LHC showed severe multivessel CAD. Referred for CABG. She has been thoroughly evaluated and is considered a good candidate for surgery.       Discharge Exam: Blood pressure 138/78, pulse (!) 54, temperature 97.9 F (36.6 C), temperature source Oral, resp.  rate 18, height 5\' 5"  (1.651 m), weight 107.9 kg, SpO2 97 %.   General appearance: alert, cooperative and no distress Heart: sinus bradycardia Lungs: clear to auscultation bilaterally Abdomen: soft, non-tender; bowel sounds normal; no masses,  no organomegaly Extremities: extremities normal, atraumatic, no cyanosis or edema Wound: clean and dry  Disposition: Discharge disposition: 01-Home or Self Care         Allergies as of 06/25/2020       Reactions   Cefaclor    Codeine    Penicillins    Sulfa Antibiotics    Trazodone And Nefazodone    Vibramycin [doxycycline Calcium]    Erythromycin Rash        Medication List     STOP taking these medications    carvedilol 6.25 MG tablet Commonly known as: COREG   LORazepam 1 MG tablet Commonly known as: ATIVAN   losartan 100 MG tablet Commonly known as: COZAAR   metoprolol succinate 25 MG 24 hr tablet Commonly known as: TOPROL-XL   rOPINIRole 0.25 MG tablet Commonly known as: REQUIP   simvastatin 20 MG tablet Commonly known as: ZOCOR       TAKE these medications    acetaminophen 500 MG tablet Commonly known as: TYLENOL Take 500 mg by mouth every 6 (six) hours as needed for mild pain, fever or headache.   albuterol 108 (90 Base) MCG/ACT inhaler Commonly known as: VENTOLIN HFA Inhale 2 puffs into the lungs every 6 (six) hours as needed for wheezing or shortness of breath.   amiodarone 200 MG tablet Commonly known as: PACERONE Take 1  tablet (200 mg total) by mouth 2 (two) times daily.   aspirin 81 MG tablet Take 81 mg by mouth daily.   atorvastatin 80 MG tablet Commonly known as: LIPITOR Take 1 tablet (80 mg total) by mouth daily.   B-12 PO Take 1 tablet by mouth daily.   clopidogrel 75 MG tablet Commonly known as: PLAVIX Take 1 tablet (75 mg total) by mouth daily.   dapagliflozin propanediol 10 MG Tabs tablet Commonly known as: Farxiga Take 1 tablet (10 mg total) by mouth daily.   escitalopram 20 MG tablet Commonly known as: LEXAPRO Take 20 mg by mouth daily.   fluticasone 50 MCG/ACT nasal spray Commonly known as: FLONASE Place 1 spray into both nostrils daily.   furosemide 40 MG tablet Commonly known as: Lasix Take 1 tablet (40 mg total) by mouth daily.   gabapentin 100 MG capsule Commonly known as: NEURONTIN Take 300 mg by mouth at bedtime.   isosorbide dinitrate 20 MG tablet Commonly known as: ISORDIL Take 1 tablet (20 mg total) by mouth 3 (three) times daily.   levothyroxine 137 MCG tablet Commonly known as: SYNTHROID Take 137 mcg by mouth daily.   metFORMIN 500 MG 24 hr tablet Commonly known as: GLUCOPHAGE-XR Take 1,000 mg by mouth 2 (two) times daily.   metoprolol tartrate 25 MG tablet Commonly known as: LOPRESSOR Take 1 tablet (25 mg total) by mouth 2 (two) times daily.   montelukast 10 MG tablet Commonly known as: SINGULAIR Take 10 mg by mouth at bedtime.   NovoLOG Mix 70/30 FlexPen (70-30) 100 UNIT/ML FlexPen Generic drug: insulin aspart protamine - aspart Inject 0-70 Units into the skin 2 (two) times daily.   polyvinyl alcohol 1.4 % ophthalmic solution Commonly known as: LIQUIFILM TEARS Place 1 drop into both eyes as needed for dry eyes.   potassium chloride SA 20 MEQ tablet Commonly known as: KLOR-CON Take  1 tablet (20 mEq total) by mouth daily.   spironolactone 25 MG tablet Commonly known as: ALDACTONE Take 50 mg by mouth 2 (two) times daily.   Symbicort 80-4.5  MCG/ACT inhaler Generic drug: budesonide-formoterol Inhale 2 puffs into the lungs daily.   traMADol 50 MG tablet Commonly known as: Ultram Take 1 tablet (50 mg total) by mouth every 8 (eight) hours as needed.   Vitamin D 50 MCG (2000 UT) Caps Take 2,000 Units by mouth daily.               Durable Medical Equipment  (From admission, onward)           Start     Ordered   06/21/20 1009  For home use only DME Walker rolling  Once       Question Answer Comment  Walker: With 5 Inch Wheels   Patient needs a walker to treat with the following condition Debility      06/21/20 1008   06/21/20 1008  For home use only DME 3 n 1  Once        06/21/20 1007            Follow-up Information     Atkins, Merri Brunette, MD Follow up.   Specialty: Cardiothoracic Surgery Why: Your routine follow-up appointment is on  12/13 @11 :30. Please arrive at 11:00am for a chest xray located at [facility] which is located on the first floor of our building. Contact information: [address] [place] [address] [phone]         Manson Passey, PA Follow up.   Specialty: Cardiology Why: Chelsea Aus, PA-C 12/9 @11 :15 am ([facility])  Contact information: [address] [place] [address] [phone]         Marykay Lex, MD. Call in 1 day(s).   Specialty: Cardiology Contact information: [address] [place] [address] [phone]                The patient has been discharged on:   1.Beta Blocker:  Yes Mahler.Beck   ]                              No   [   ]                              If No, reason:  2.Ace Inhibitor/ARB: Yes [   ]                                     No  [   no ]                                     If No, reason: renal function  3.Statin:   Yes [ yes ]                  No  [   ]                  If No, reason:  4.Ecasa:  Yes  [  yes ]  No   [   ]                   If No, reason:      Signed: Sharlene Dory 06/25/2020, 9:17 AM

## 2020-06-22 NOTE — Progress Notes (Signed)
PT Cancellation Note  Patient Details Name: Angela Frazier MRN: [id number] DOB: August 02, 1945   Cancelled Treatment:    Reason Eval/Treat Not Completed: (P) Other (comment) (Pt eating will f/u per POC.)   Nachmen Mansel Artis Delay 06/22/2020, 5:11 PM  Bonney Leitz , PTA Acute Rehabilitation Services Pager [phone] Office [phone]

## 2020-06-22 NOTE — Progress Notes (Addendum)
Progress Note  Patient Name: Angela Frazier Date of Encounter: 06/22/2020  [facility] Cardiologist: No primary care provider on file.   Subjective   Walked this morning, did feel short of breath.   Inpatient Medications    Scheduled Meds: . acetaminophen  1,000 mg Oral Q6H   Or  . acetaminophen (TYLENOL) oral liquid 160 mg/5 mL  1,000 mg Per Tube Q6H  . amiodarone  200 mg Oral BID  . aspirin EC  81 mg Oral Daily  . atorvastatin  80 mg Oral Daily  . bisacodyl  10 mg Oral Daily   Or  . bisacodyl  10 mg Rectal Daily  . Chlorhexidine Gluconate Cloth  6 each Topical Daily  . clopidogrel  75 mg Oral Daily  . docusate sodium  200 mg Oral Daily  . famotidine  10 mg Oral Daily  . guaiFENesin  600 mg Oral BID  . insulin aspart  0-15 Units Subcutaneous TID WC  . insulin aspart  0-5 Units Subcutaneous QHS  . insulin aspart  4 Units Subcutaneous TID WC  . insulin detemir  40 Units Subcutaneous QHS  . isosorbide dinitrate  20 mg Oral TID  . levothyroxine  137 mcg Oral QAC breakfast  . metoprolol tartrate  25 mg Oral BID   Or  . metoprolol tartrate  25 mg Per Tube BID  . spironolactone  25 mg Oral Daily   Continuous Infusions: . lactated ringers     PRN Meds: dextrose, lactated ringers, metoprolol tartrate, oxyCODONE   Vital Signs    Vitals:   06/21/20 2017 06/22/20 0025 06/22/20 0425 06/22/20 0830  BP: (!) 159/72 136/69 (!) 154/73 (!) 157/73  Pulse: 62 (!) 58 (!) 56 63  Resp: 20 18  18   Temp: 98.1 F (36.7 C) 97.6 F (36.4 C) 97.6 F (36.4 C) 97.6 F (36.4 C)  TempSrc: Oral Oral Oral Oral  SpO2: 96% 99% 96% 96%  Weight:   113.1 kg   Height:        Intake/Output Summary (Last 24 hours) at 06/22/2020 1030 Last data filed at 06/22/2020 0400 Gross per 24 hour  Intake 426.21 ml  Output 0 ml  Net 426.21 ml   Last 3 Weights 06/22/2020 06/21/2020 06/20/2020  Weight (lbs) 249 lb 5.4 oz 248 lb 13.3 oz 250 lb 10.6 oz  Weight (kg) 113.1 kg 112.87 kg 113.7 kg       Telemetry    SB rates in the 50s- Personally Reviewed  ECG    SB, rate 55bpm, Qtc 438  - Personally Reviewed  Physical Exam  Pleasant older female GEN: No acute distress.   Neck: No JVD Cardiac: RRR, no murmurs, rubs, or gallops.  Respiratory: Diminished bilaterally in lower lobes GI: Soft, nontender, non-distended  MS: 1+ LE edema bilaterally; No deformity. Neuro:  Nonfocal  Psych: Normal affect   Labs    High Sensitivity Troponin:   Recent Labs  Lab 06/15/20 2134 06/16/20 0123 06/16/20 0232 06/16/20 0513 06/16/20 0736  TROPONINIHS 6,026* 9,657* 10,270* 10,433* 9,038*      Chemistry Recent Labs  Lab 06/16/20 0232 06/17/20 0031 06/20/20 0419 06/21/20 0218 06/22/20 0032  NA 138   < > 130* 126* 133*  K 4.2   < > 4.1 4.7 3.9  CL 103   < > 99 98 99  CO2 24   < > 21* 18* 22  GLUCOSE 202*   < > 124* 219* 119*  BUN 12   < > 13  15 16  CREATININE 1.07*   < > 0.88 0.99 1.09*  CALCIUM 9.0   < > 8.0* 8.1* 8.6*  PROT 6.7  --   --   --   --   ALBUMIN 3.3*  --   --   --   --   AST 32  --   --   --   --   ALT 16  --   --   --   --   ALKPHOS 52  --   --   --   --   BILITOT 1.0  --   --   --   --   GFRNONAA 55*   < > >60 >60 54*  ANIONGAP 11   < > 10 10 12    < > = values in this interval not displayed.     Hematology Recent Labs  Lab 06/19/20 1739 06/20/20 0419 06/21/20 0218  WBC 11.9* 11.9* 13.7*  RBC 3.38* 3.38* 3.62*  HGB 9.8* 9.7* 10.3*  HCT 30.5* 30.2* 31.6*  MCV 90.2 89.3 87.3  MCH 29.0 28.7 28.5  MCHC 32.1 32.1 32.6  RDW 14.3 14.2 14.3  PLT 144* 139* 183    BNPNo results for input(s): BNP, PROBNP in the last 168 hours.   DDimer No results for input(s): DDIMER in the last 168 hours.   Radiology    DG Chest 2 View  Result Date: 06/21/2020 CLINICAL DATA:  History of open heart surgery. EXAM: CHEST - 2 VIEW COMPARISON:  06/20/2020 FINDINGS: Chest tubes and right jugular central line have been removed. Streaky densities in the mid left lung are  suggestive for atelectasis. Heart size remains enlarged. Prior median sternotomy. Negative for pneumothorax. Hazy densities at the right lung base. No evidence for pulmonary edema. IMPRESSION: 1. Removal of support apparatuses.  Negative for pneumothorax. 2. Hazy densities at the right lung base. Findings are suggestive for atelectasis. Difficult to exclude trace right pleural fluid. 3. Atelectasis in the mid left lung. Electronically Signed   By: 06/22/2020 M.D.   On: 06/21/2020 08:28    Cardiac Studies   Echo: 06/16/20  IMPRESSIONS    1. Left ventricular ejection fraction, by estimation, is 35 to 40%. The  left ventricle has moderately decreased function. The left ventricle  demonstrates regional wall motion abnormalities (see scoring  diagram/findings for description). There is mild  concentric left ventricular hypertrophy. Left ventricular diastolic  parameters are consistent with Grade II diastolic dysfunction  (pseudonormalization). Elevated left ventricular end-diastolic pressure.  2. Right ventricular systolic function is normal. The right ventricular  size is normal. There is mildly elevated pulmonary artery systolic  pressure.  3. Left atrial size was mildly dilated.  4. Right atrial size was mildly dilated.  5. The mitral valve is normal in structure. Trivial mitral valve  regurgitation. No evidence of mitral stenosis.  6. The aortic valve is grossly normal. There is mild calcification of the  aortic valve. Aortic valve regurgitation is not visualized. No aortic  stenosis is present.  7. The inferior vena cava is dilated in size with <50% respiratory  variability, suggesting right atrial pressure of 15 mmHg.   Conclusion(s)/Recommendation(s): Reduced LVEF, focal wall motion  abnormalities as noted. No LV thrombus seen with contrast.   Patient Profile     74 y.o. 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 Multivessel CAD s/p CABG.   Assessment &  Plan    1. CAD with NSTEMI s/p CABG: progressing well post op. Treated with ASA/plavix, statin and BB. Working with CR. On isordil for radial harvest  2. Post volume overload: weight is up with LE edema noted. Lasix has been held in the setting of prolong QTc but this has improved on EKG this morning. Would agree with lasix today  3. Post op PAF: treated with IV amiodarone, now transitioned to 200mg  BID. Remains in NSR.   4. HTN: borderline elevated. Unable to further titrate BB with bradycardia. On spiro 25mg  daily. Consider resuming losartan with stable renal function.   5. Acute combined systolic and diastolic HF: as above would agree with lasix. HR are low but tolerating low dose BB. Home ARB was losartan 100mg  daily  6. DM: Hgb A1c 9.1 on admission. Treated with metformin, insulin 70-30 prior to admission. Will check coverage for SGLT2.  -- on SSI   7. HLD: on Zocor prior to admission. Now on atorvastatin 80mg .  -- FLP/LFTs in 8 weeks  For questions or updates, please contact [facility] Please consult [email or web address] for contact info under        Signed, , NP  06/22/2020, 10:30 AM     I have examined the patient and reviewed assessment and plan and discussed with patient.  Agree with above as stated.  Aggressive DM management.  Beta blocker for low EF.  Tolerating bradycardia.    DAPT given NSTEMI.  01-[birth year]-1997

## 2020-06-22 NOTE — Progress Notes (Signed)
Pacing wire D/C per order. Pt tolerated well. Educated on 1-hr bed rest. Will continue to monitor pt. VSS.   Everlean Cherry, RN

## 2020-06-22 NOTE — Progress Notes (Signed)
Mobility Specialist: Progress Note   06/22/20 1451  Mobility  Activity Ambulated in hall  Level of Assistance Independent  Assistive Device None  Distance Ambulated (ft) 140 ft  Mobility Response Tolerated fair  Mobility performed by Mobility specialist  Bed Position Chair  $Mobility charge 1 Mobility   Pre-Mobility: 58 HR Post-Mobility: 80 HR  Pt c/o feeling SOB during ambulation. Pt stopped to take 3 standing rest breaks during ambulation due to feeling SOB, RN notified. Pt to BR after returning to room, RN present.   [facility] Jazzmyn Filion Mobility Specialist

## 2020-06-23 DIAGNOSIS — I5021 Acute systolic (congestive) heart failure: Secondary | ICD-10-CM

## 2020-06-23 LAB — GLUCOSE, CAPILLARY
Glucose-Capillary: 84 mg/dL (ref 70–99)
Glucose-Capillary: 90 mg/dL (ref 70–99)
Glucose-Capillary: 78 mg/dL (ref 70–99)
Glucose-Capillary: 119 mg/dL — ABNORMAL HIGH (ref 70–99)

## 2020-06-23 LAB — BASIC METABOLIC PANEL
Anion gap: 12 (ref 5–15)
BUN: 18 mg/dL (ref 8–23)
CO2: 23 mmol/L (ref 22–32)
Calcium: 8.6 mg/dL — ABNORMAL LOW (ref 8.9–10.3)
Chloride: 101 mmol/L (ref 98–111)
Creatinine, Ser: 1.18 mg/dL — ABNORMAL HIGH (ref 0.44–1.00)
GFR, Estimated: 49 mL/min — ABNORMAL LOW (ref >60)
Glucose, Bld: 121 mg/dL — ABNORMAL HIGH (ref 70–99)
Potassium: 3.8 mmol/L (ref 3.5–5.1)
Sodium: 136 mmol/L (ref 135–145)

## 2020-06-23 MED ORDER — POTASSIUM CHLORIDE CRYS ER 20 MEQ PO TBCR
20.0000 meq | EXTENDED_RELEASE_TABLET | Freq: Two times a day (BID) | ORAL | Status: DC
  Administered 2020-06-23 – 2020-06-25 (×5): 20 meq via ORAL
  Filled 2020-06-23 (×5): qty 1

## 2020-06-23 MED ORDER — FUROSEMIDE 10 MG/ML IJ SOLN
40.0000 mg | Freq: Two times a day (BID) | INTRAMUSCULAR | Status: DC
  Administered 2020-06-23 – 2020-06-24 (×4): 40 mg via INTRAVENOUS
  Filled 2020-06-23 (×5): qty 4

## 2020-06-23 NOTE — Progress Notes (Signed)
Inpatient Diabetes Program Recommendations  AACE/ADA: New Consensus Statement on Inpatient Glycemic Control (2015)  Target Ranges:  Prepandial:   less than 140 mg/dL      Peak postprandial:   less than 180 mg/dL (1-2 hours)      Critically ill patients:  140 - 180 mg/dL   Lab Results  Component Value Date   GLUCAP 90 06/23/2020   HGBA1C 9.1 (H) 06/17/2020    Review of Glycemic Control Results for CAPRIA, CARTAYA (MRN [id number]) as of 06/23/2020 12:10  Ref. Range 06/22/2020 11:23 06/22/2020 15:39 06/22/2020 21:10 06/23/2020 06:10 06/23/2020 11:48  Glucose-Capillary Latest Ref Range: 70 - 99 mg/dL 782 (H) 956 (H) 213 (H) 84 90   Current orders for Inpatient glycemic control:  Levemir 40 units QHS Novolog 4 units tid with meals Novolog 0-15 units tid & 0-5 QHS  Inpatient Diabetes Program Recommendations:     Please consider decreasing Levemir to 32 units QHS as fasting CBG was 84 mg/dL this morning.  Will continue to follow while inpatient.  Thank you, Dulce Sellar, RN, BSN Diabetes Coordinator Inpatient Diabetes Program [phone] (team pager from 8a-5p)

## 2020-06-23 NOTE — Progress Notes (Signed)
Mobility Specialist: Progress Note   06/23/20 1355  Mobility  Activity Ambulated in hall  Level of Assistance Modified independent, requires aide device or extra time  Assistive Device Front wheel walker  Distance Ambulated (ft) 240 ft  Mobility Response Tolerated well  Mobility performed by Mobility specialist  Bed Position Chair  $Mobility charge 1 Mobility   Pre-Mobility: 56 HR, 130/49 BP, 95% SpO2 Post-Mobility: 68 HR, 168/66 BP, 96% SpO2  Pt c/o feeling a little SOB during walk but said she felt better today than yesterday. Pt did not have to take any breaks during ambulation today.   [facility] Britlee Skolnik Mobility Specialist

## 2020-06-23 NOTE — Progress Notes (Signed)
      [address]       [place] [address]             [phone]      5 Days Post-Op Procedure(s) (LRB): CORONARY ARTERY BYPASS GRAFTING (CABG), ON PUMP, TIMES FOUR, USING LEFT INTERNAL MAMMARY ARTERY, ENDOSCOPICALLY HARVESTED RIGHT GREATER SAPHENOUS VEIN, AND LEFT RADIAL ARTERY (OPEN HARVEST) (N/A) RADIAL ARTERY HARVEST (Left) TRANSESOPHAGEAL ECHOCARDIOGRAM (TEE) (N/A) INDOCYANINE GREEN FLUORESCENCE IMAGING (ICG) (N/A) Subjective: She feels okay today. Still having to stop several times when she was walking in the halls.   Objective: Vital signs in last 24 hours: Temp:  [97.6 F (36.4 C)-98.5 F (36.9 C)] 98 F (36.7 C) (12/01 0346) Pulse Rate:  [49-66] 60 (12/01 0346) Cardiac Rhythm: Sinus bradycardia (11/30 1915) Resp:  [18-20] 20 (12/01 0346) BP: (129-181)/(64-77) 156/67 (12/01 0346) SpO2:  [95 %-100 %] 95 % (12/01 0346) Weight:  [111.4 kg] 111.4 kg (12/01 0346)    Intake/Output from previous day: 11/30 0701 - 12/01 0700 In: 480 [P.O.:480] Out: -  Intake/Output this shift: No intake/output data recorded.  General appearance: alert, cooperative and no distress Heart: sinus bradycardia Lungs: clear to auscultation bilaterally Abdomen: soft, non-tender; bowel sounds normal; no masses,  no organomegaly Extremities: 1-2+ pitting edema in lower ext Wound: clean and dry sternal wound and left radial incision  Lab Results: Recent Labs    06/21/20 0218  WBC 13.7*  HGB 10.3*  HCT 31.6*  PLT 183   BMET:  Recent Labs    06/22/20 0032 06/23/20 0139  NA 133* 136  K 3.9 3.8  CL 99 101  CO2 22 23  GLUCOSE 119* 121*  BUN 16 18  CREATININE 1.09* 1.18*  CALCIUM 8.6* 8.6*    PT/INR: No results for input(s): LABPROT, INR in the last 72 hours. ABG    Component Value Date/Time   PHART 7.269 (L) 06/18/2020 1828   HCO3 21.8 06/18/2020 1828   TCO2 23 06/18/2020 1828   ACIDBASEDEF 5.0 (H) 06/18/2020 1828   O2SAT 48.9 06/20/2020 1106   CBG  (last 3)  Recent Labs    06/22/20 1539 06/22/20 2110 06/23/20 0610  GLUCAP 120* 158* 84    Assessment/Plan: S/P Procedure(s) (LRB): CORONARY ARTERY BYPASS GRAFTING (CABG), ON PUMP, TIMES FOUR, USING LEFT INTERNAL MAMMARY ARTERY, ENDOSCOPICALLY HARVESTED RIGHT GREATER SAPHENOUS VEIN, AND LEFT RADIAL ARTERY (OPEN HARVEST) (N/A) RADIAL ARTERY HARVEST (Left) TRANSESOPHAGEAL ECHOCARDIOGRAM (TEE) (N/A) INDOCYANINE GREEN FLUORESCENCE IMAGING (ICG) (N/A)   1. CV-NSR in the 50s, On asa, statin, lopressor (holding parameters in place), and amio PO. Continue Amio to 200mg  BID due to bradycardia. Continue isordil for radial artery harvest. Off milrinone.  2. Pulm-on room air with good saturation. Off and on shortness of breath per the patient. + cough with sputum, will add mucinex. Atelectasis on CXR. Continue IS. 3. Renal-creatinine 1.18, electrolytes okay. Remains 9kgs above baseline weight. On spirolactone and lasix 4. H and H stable 10.3/31.6, stable 5. GI- no n/v, eating small amounts 6. Blood glucose better controlled  Plan: Continue diuretics. Creatinine beginning to increase slightly. Blood glucose looks better. Tolerating sinus bradycardia. EPW removed yesterday. Will discuss home PT/OT with case management today.   LOS: 7 days    04-25-1972 06/23/2020

## 2020-06-23 NOTE — Progress Notes (Signed)
CARDIAC REHAB PHASE I   PRE:  Rate/Rhythm: 57 SB  BP:  Supine:   Sitting: 144/55  Standing:    SaO2: 95%RA  MODE:  Ambulation: 175 ft   POST:  Rate/Rhythm: 76 SR  BP:  Supine:   Sitting: 184/69,  161/72  Standing:    SaO2: 93%RA [id number] Pt walked 175 ft on RA with rolling walker with steady gait. Stopped once to rest. Still with c/o shortness of breath with mask and activity. To recliner with call bell.    Luetta Nutting, RN BSN  06/23/2020 8:36 AM

## 2020-06-23 NOTE — Progress Notes (Signed)
Progress Note  Patient Name: Angela Frazier Date of Encounter: 06/23/2020  [facility] Cardiologist: No primary care provider on file.   Subjective   Feeling better this morning.   Inpatient Medications    Scheduled Meds: . acetaminophen  1,000 mg Oral Q6H   Or  . acetaminophen (TYLENOL) oral liquid 160 mg/5 mL  1,000 mg Per Tube Q6H  . amiodarone  200 mg Oral BID  . aspirin EC  81 mg Oral Daily  . atorvastatin  80 mg Oral Daily  . bisacodyl  10 mg Oral Daily   Or  . bisacodyl  10 mg Rectal Daily  . Chlorhexidine Gluconate Cloth  6 each Topical Daily  . clopidogrel  75 mg Oral Daily  . docusate sodium  200 mg Oral Daily  . famotidine  10 mg Oral Daily  . furosemide  40 mg Intravenous BID  . guaiFENesin  600 mg Oral BID  . insulin aspart  0-15 Units Subcutaneous TID WC  . insulin aspart  0-5 Units Subcutaneous QHS  . insulin aspart  4 Units Subcutaneous TID WC  . insulin detemir  40 Units Subcutaneous QHS  . isosorbide dinitrate  20 mg Oral TID  . levothyroxine  137 mcg Oral QAC breakfast  . metoprolol tartrate  25 mg Oral BID   Or  . metoprolol tartrate  25 mg Per Tube BID  . potassium chloride  20 mEq Oral BID  . spironolactone  25 mg Oral Daily   Continuous Infusions: . lactated ringers     PRN Meds: dextrose, lactated ringers, metoprolol tartrate, oxyCODONE   Vital Signs    Vitals:   06/22/20 1952 06/22/20 2327 06/23/20 0346 06/23/20 0731  BP: (!) 158/77 (!) 145/67 (!) 156/67 (!) 144/55  Pulse: 66 (!) 49 60 (!) 56  Resp: 19 18 20 18   Temp: 98.5 F (36.9 C) 98.1 F (36.7 C) 98 F (36.7 C) (!) 97.5 F (36.4 C)  TempSrc: Oral Oral Oral Oral  SpO2: 96% 98% 95% 96%  Weight:   111.4 kg   Height:        Intake/Output Summary (Last 24 hours) at 06/23/2020 0948 Last data filed at 06/22/2020 1500 Gross per 24 hour  Intake 240 ml  Output --  Net 240 ml   Last 3 Weights 06/23/2020 06/22/2020 06/21/2020  Weight (lbs) 245 lb 11.2 oz 249 lb 5.4 oz 248 lb  13.3 oz  Weight (kg) 111.449 kg 113.1 kg 112.87 kg      Telemetry    SR - Personally Reviewed  ECG    No new tracing  Physical Exam  Pleasant older female GEN: No acute distress.   Neck: No JVD Cardiac: RRR, no murmurs, rubs, or gallops.  Respiratory: Clear to auscultation bilaterally. GI: Soft, nontender, non-distended  MS: 2+ LE edema; No deformity. Neuro:  Nonfocal  Psych: Normal affect   Labs    High Sensitivity Troponin:   Recent Labs  Lab 06/15/20 2134 06/16/20 0123 06/16/20 0232 06/16/20 0513 06/16/20 0736  TROPONINIHS 6,026* 9,657* 10,270* 10,433* 9,038*      Chemistry Recent Labs  Lab 06/21/20 0218 06/22/20 0032 06/23/20 0139  NA 126* 133* 136  K 4.7 3.9 3.8  CL 98 99 101  CO2 18* 22 23  GLUCOSE 219* 119* 121*  BUN 15 16 18   CREATININE 0.99 1.09* 1.18*  CALCIUM 8.1* 8.6* 8.6*  GFRNONAA >60 54* 49*  ANIONGAP 10 12 12      Hematology Recent Labs  Lab  06/19/20 1739 06/20/20 0419 06/21/20 0218  WBC 11.9* 11.9* 13.7*  RBC 3.38* 3.38* 3.62*  HGB 9.8* 9.7* 10.3*  HCT 30.5* 30.2* 31.6*  MCV 90.2 89.3 87.3  MCH 29.0 28.7 28.5  MCHC 32.1 32.1 32.6  RDW 14.3 14.2 14.3  PLT 144* 139* 183    BNPNo results for input(s): BNP, PROBNP in the last 168 hours.   DDimer No results for input(s): DDIMER in the last 168 hours.   Radiology    No results found.  Cardiac Studies   Echo: 06/16/20  IMPRESSIONS    1. Left ventricular ejection fraction, by estimation, is 35 to 40%. The  left ventricle has moderately decreased function. The left ventricle  demonstrates regional wall motion abnormalities (see scoring  diagram/findings for description). There is mild  concentric left ventricular hypertrophy. Left ventricular diastolic  parameters are consistent with Grade II diastolic dysfunction  (pseudonormalization). Elevated left ventricular end-diastolic pressure.  2. Right ventricular systolic function is normal. The right ventricular  size  is normal. There is mildly elevated pulmonary artery systolic  pressure.  3. Left atrial size was mildly dilated.  4. Right atrial size was mildly dilated.  5. The mitral valve is normal in structure. Trivial mitral valve  regurgitation. No evidence of mitral stenosis.  6. The aortic valve is grossly normal. There is mild calcification of the  aortic valve. Aortic valve regurgitation is not visualized. No aortic  stenosis is present.  7. The inferior vena cava is dilated in size with <50% respiratory  variability, suggesting right atrial pressure of 15 mmHg.   Conclusion(s)/Recommendation(s): Reduced LVEF, focal wall motion  abnormalities as noted. No LV thrombus seen with contrast.   Patient Profile     74 y.o. female with PMH of hypertension, untreated OSA, and poorly-controlled type 2 diabetes complicated by neuropathy who presented with NSTEMI and taken for cath. Multivessel CAD s/p CABG.   Assessment & Plan    1. CAD with NSTEMI s/p CABG: progressing well post op. Treated with ASA/plavix, statin and BB. Working with CR. On isordil for radial harvest  2. Combined systolic/diastolic HF with post op volume overload: started on lasix yesterday, weight is down today 249>>245lbs. I&Os not recorded. Would continue lasix as tolerated as she remains volume overloaded -- of note was on coreg 6.25mg  BID prior to admission. With reduced EF would favor transitioning back to coreg or switching to Toprol XL  3. Post op PAF: treated with IV amiodarone, now transitioned to 200mg  BID. Remains in NSR.   4. HTN: borderline elevated. Unable to further titrate BB with bradycardia. On spiro 25mg  daily. Consider resuming losartan with stable renal function.   5. Acute combined systolic and diastolic HF: as above would agree with lasix. HR are low but tolerating low dose BB. Home ARB was losartan 100mg  daily  6. DM: Hgb A1c 9.1 on admission. Treated with metformin, insulin 70-30 prior to  admission.  -- cost check on Farxiga, HF PharmD contacted to help with medication assistance prior to discharge -- on SSI   7. HLD: on Zocor prior to admission. Now on atorvastatin 80mg .  -- FLP/LFTs in 8 weeks  For questions or updates, please contact [facility] Please consult [email or web address] for contact info under        Signed, , NP  06/23/2020, 9:48 AM

## 2020-06-23 NOTE — Progress Notes (Signed)
Physical Therapy Treatment Patient Details Name: Angela Frazier MRN: [id number] DOB: 1945/09/28 Today's Date: 06/23/2020    History of Present Illness Pt is a 74 y/o F presenting to the ED on 11/23 for chest pain, nausea, and radiating pain in L arm with NSTEMI. 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 PMH includes HTN, hyperlipidemia, OSA, hypothyroidism, asthma, and DM II with neuropathy.    PT Comments    Pt seated in recliner.  Pt agreeable to PT session. Continues to get [facility] but maintained sats on RA.  Performed stair training this session, LE exercises, and postural exercises.  Pt continues to require HHPT at this time.     Follow Up Recommendations  Home health PT;Supervision/Assistance - 24 hour     Equipment Recommendations  Rolling walker with 5" wheels;3in1 (PT)    Recommendations for Other Services       Precautions / Restrictions Precautions Precautions: Sternal Precaution Booklet Issued: Yes (comment) Precaution Comments: reviewed verbally with patient Restrictions Weight Bearing Restrictions: Yes (sternal)    Mobility  Bed Mobility               General bed mobility comments: Pt seated in recliner chair on arrival this session.  Transfers Overall transfer level: Needs assistance Equipment used: None Transfers: Sit to/from Stand           General transfer comment: verbal cues for hand placement, rocking method, v/c's to place hand on R knee and avoid pushing and reaching for arm rests.  Ambulation/Gait Ambulation/Gait assistance: Min guard Gait Distance (Feet): 75 Feet (x2 (seated rest break between trials)) Assistive device: Rolling walker (2 wheeled) Gait Pattern/deviations: Step-through pattern;Decreased stride length;Trunk flexed Gait velocity: slow   General Gait Details: Continues to get [facility] but SPO2 90% during gt training at it's lowest.  Pt required lengthy seated rest period between gt trials.   Stairs Stairs: Yes Stairs  assistance: Min assist Stair Management: One rail Right Number of Stairs: 3 General stair comments: Cues for sequencing and precautions using R rail for balance.   Wheelchair Mobility    Modified Rankin (Stroke Patients Only)       Balance Overall balance assessment: Needs assistance   Sitting balance-Leahy Scale: Good       Standing balance-Leahy Scale: Fair                              Cognition Arousal/Alertness: Awake/alert Behavior During Therapy: WFL for tasks assessed/performed;Anxious Overall Cognitive Status: Within Functional Limits for tasks assessed                                        Exercises General Exercises - Lower Extremity Ankle Circles/Pumps: AROM;Both;20 reps;Seated Long Arc Quad: AROM;Both;20 reps;Seated Shoulder Exercises Neck Flexion: AROM;10 reps;Seated Neck Extension: AROM;10 reps;Seated Other Exercises Other Exercises: Cervical rotation to R and L side x 10 reps. Other Exercises: B shoulder elevation x 10 reps. Other Exercises: B shoulder circles backward x 10 reps. Other Exercises: B scapular retraction x 10 reps gentle ROM.    General Comments        Pertinent Vitals/Pain Pain Assessment: No/denies pain    Home Living                      Prior Function  PT Goals (current goals can now be found in the care plan section) Acute Rehab PT Goals Patient Stated Goal: to get stronger Potential to Achieve Goals: Good    Frequency    Min 3X/week      PT Plan Current plan remains appropriate    Co-evaluation              AM-PAC PT "6 Clicks" Mobility   Outcome Measure  Help needed turning from your back to your side while in a flat bed without using bedrails?: A Little Help needed moving from lying on your back to sitting on the side of a flat bed without using bedrails?: A Lot Help needed moving to and from a bed to a chair (including a wheelchair)?: A Little Help  needed standing up from a chair using your arms (e.g., wheelchair or bedside chair)?: A Little Help needed to walk in hospital room?: A Little Help needed climbing 3-5 steps with a railing? : A Little 6 Click Score: 17    End of Session Equipment Utilized During Treatment: Gait belt Activity Tolerance: Patient limited by fatigue Patient left: in chair;with call bell/phone within reach;with nursing/sitter in room Nurse Communication: Mobility status PT Visit Diagnosis: Other abnormalities of gait and mobility (R26.89)     Time: 1210-1229 PT Time Calculation (min) (ACUTE ONLY): 19 min  Charges:  $Gait Training: 8-22 mins                     Bonney Leitz , PTA Acute Rehabilitation Services Pager [phone] Office [phone]     Angela Frazier 06/23/2020, 1:28 PM

## 2020-06-23 NOTE — Anesthesia Postprocedure Evaluation (Signed)
Anesthesia Post Note  Patient: Angela Frazier  Procedure(s) Performed: CORONARY ARTERY BYPASS GRAFTING (CABG), ON PUMP, TIMES FOUR, USING LEFT INTERNAL MAMMARY ARTERY, ENDOSCOPICALLY HARVESTED RIGHT GREATER SAPHENOUS VEIN, AND LEFT RADIAL ARTERY (OPEN HARVEST) (N/A Chest) RADIAL ARTERY HARVEST (Left Arm Lower) TRANSESOPHAGEAL ECHOCARDIOGRAM (TEE) (N/A ) INDOCYANINE GREEN FLUORESCENCE IMAGING (ICG) (N/A Chest)     Patient location during evaluation: SICU Anesthesia Type: General Level of consciousness: sedated Pain management: pain level controlled Vital Signs Assessment: post-procedure vital signs reviewed and stable Respiratory status: patient remains intubated per anesthesia plan Cardiovascular status: stable Postop Assessment: no apparent nausea or vomiting Anesthetic complications: no   No complications documented.  Last Vitals:  Vitals:   06/23/20 0346 06/23/20 0731  BP: (!) 156/67 (!) 144/55  Pulse: 60 (!) 56  Resp: 20 18  Temp: 36.7 C (!) 36.4 C  SpO2: 95% 96%    Last Pain:  Vitals:   06/23/20 0731  TempSrc: Oral  PainSc:                  Leba Tibbitts S

## 2020-06-23 NOTE — TOC Progression Note (Signed)
Transition of Care (TOC) - Progression Note  Donn Pierini RN, BSN Transitions of Care Unit 4E- RN Case Manager See Treatment Team for direct phone #    Patient Details  Name: Angela Frazier MRN: [id number] Date of Birth: [birth year]  Transition of Care [facility]) CM/SW Contact  Zenda Alpers Lenn Sink, RN Phone Number: 06/23/2020, 3:45 PM  Clinical Narrative:    Follow up done with pt for address to her son's home where she will be staying upon discharge for a few weeks.  [address]  [place] [address]  [facility] updated with address [name] will be able to service pt both at her son's home in [place] and then also when she returns to her home in [place].  Expected Discharge Plan: Home w Home Health Services Barriers to Discharge: Continued Medical Work up  Expected Discharge Plan and Services Expected Discharge Plan: Home w Home Health Services   Discharge Planning Services: CM Consult Post Acute Care Choice: Durable Medical Equipment, Home Health Living arrangements for the past 2 months: Single Family Home                 DME Arranged: 3-N-1, Walker rolling DME Agency: [employer] Date DME Agency Contacted: 06/22/20 Time DME Agency Contacted: [phone] Representative spoke with at DME Agency: Silvio Pate HH Arranged: PT   Date HH Agency Contacted: 06/22/20 Time HH Agency Contacted: 1641     Social Determinants of Health (SDOH) Interventions    Readmission Risk Interventions No flowsheet data found.

## 2020-06-23 NOTE — Progress Notes (Signed)
Heart Failure Stewardship Pharmacist Progress Note   PCP: System, Provider Not In PCP-Cardiologist: No primary care provider on file.    HPI:  74 yo F with PMH of HTN, OSA, and T2DM. 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 ECHO done 06/16/20 and LVEF was reduced to 35-40%. S/p CABG on 06/18/20.  Current HF Medications: Furosemide 40 mg IV BID Metoprolol tartrate 25 mg BID Spironolactone 25 mg daily Isosorbide dinitrate 20 mg TID  Prior to admission HF Medications: Carvedilol 6.25 mg BID & metoprolol XL 25 mg daily  Losartan 100 mg daily Spironolactone 50 mg BID  Pertinent Lab Values: . Serum creatinine 1.18, BUN 18, Potassium 3.8, Sodium 136, Magnesium   Vital Signs: . Weight: 245 lbs o Admission weight: 226 lbs o Post-op weight: 247 lbs . Blood pressure: 150/60s  . Heart rate: 50-60s   Medication Assistance / Insurance Benefits Check: Does the patient have prescription insurance?  Yes Type of insurance plan: [employer] Medicare  Does the patient qualify for medication assistance through manufacturers or grants?   Yes . Eligible grants and/or patient assistance programs: [employer]ard . Medication assistance applications in progress: [employer] . Medication assistance applications approved: none Approved medication assistance renewals will be completed by: [facility]  Outpatient Pharmacy:  Prior to admission outpatient pharmacy: [facility] Is the patient willing to use [facility] TOC pharmacy at discharge? Yes  Assessment: 1. 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 NYHA class II symptoms. - Continue furosemide 40 mg IV BID. Weight is still up 19 lbs from admission/pre-CABG weight. - Consider switching metoprolol tartrate to XL or carvedilol. Of note, patient reported taking both PTA, will educate to avoid duplicate therapy. - Consider starting Entresto once SCr stable (1.09>1.18  today) rather than resuming losartan given low EF (<57%) - Continue spironolactone 25 mg daily - Continue isosorbide dinitrate 20 mg TID for both radial harvest + HFrEF management - Consider adding Farxiga prior to discharge   Plan: 1) Medication changes recommended at this time: - Continue current regimen at this time - Prior to discharge:   1) Switch metoprolol tartrate to XL or to carvedilol   2) Start Entresto 24/26 mg BID instead of PTA losartan   3) Add Farxiga 10 mg daily  2) Patient assistance application(s): - Will begin patient assistance application for Farxiga and Entresto  3)  Education  - To be completed prior to discharge  Sharen Hones, PharmD, BCPS Heart Failure Engineer, building services Phone [phone]

## 2020-06-24 ENCOUNTER — Encounter (HOSPITAL_COMMUNITY): Payer: Self-pay | Admitting: Cardiothoracic Surgery

## 2020-06-24 LAB — GLUCOSE, CAPILLARY
Glucose-Capillary: 104 mg/dL — ABNORMAL HIGH (ref 70–99)
Glucose-Capillary: 112 mg/dL — ABNORMAL HIGH (ref 70–99)
Glucose-Capillary: 173 mg/dL — ABNORMAL HIGH (ref 70–99)
Glucose-Capillary: 170 mg/dL — ABNORMAL HIGH (ref 70–99)

## 2020-06-24 LAB — BASIC METABOLIC PANEL
Anion gap: 15 (ref 5–15)
BUN: 17 mg/dL (ref 8–23)
CO2: 20 mmol/L — ABNORMAL LOW (ref 22–32)
Calcium: 8.4 mg/dL — ABNORMAL LOW (ref 8.9–10.3)
Chloride: 103 mmol/L (ref 98–111)
Creatinine, Ser: 1.32 mg/dL — ABNORMAL HIGH (ref 0.44–1.00)
GFR, Estimated: 43 mL/min — ABNORMAL LOW (ref >60)
Glucose, Bld: 118 mg/dL — ABNORMAL HIGH (ref 70–99)
Potassium: 3.7 mmol/L (ref 3.5–5.1)
Sodium: 138 mmol/L (ref 135–145)

## 2020-06-24 MED ORDER — ACETAMINOPHEN 325 MG PO TABS
650.0000 mg | ORAL_TABLET | Freq: Four times a day (QID) | ORAL | Status: DC | PRN
  Administered 2020-06-24 (×2): 650 mg via ORAL
  Filled 2020-06-24 (×2): qty 2

## 2020-06-24 NOTE — Progress Notes (Signed)
CARDIAC REHAB PHASE I   PRE:  Rate/Rhythm: 58 SB  BP:  Supine:   Sitting: 134/62  Standing:    SaO2: 97%RA  MODE:  Ambulation: 200 ft   POST:  Rate/Rhythm: 76 SR  BP:  Supine:   Sitting: 151/7  Standing:    SaO2: 96%RA 1008-1038 Pt walked 200 ft on RA with rolling walker with steady gait. Stopped once to rest due to SOB. Not as dyspneic as she has been with walking. Rocks to stand and adheres to sternal precautions. To recliner with call bell. Encouraged more walks today.   Luetta Nutting, RN BSN  06/24/2020 10:35 AM

## 2020-06-24 NOTE — Progress Notes (Addendum)
Progress Note  Patient Name: Angela Frazier Date of Encounter: 06/24/2020  [facility] Cardiologist: Bryan Lemma, MD   Subjective   Up walking to the bathroom. Feels her LE edema is improved.   Inpatient Medications    Scheduled Meds: . amiodarone  200 mg Oral BID  . aspirin EC  81 mg Oral Daily  . atorvastatin  80 mg Oral Daily  . bisacodyl  10 mg Oral Daily   Or  . bisacodyl  10 mg Rectal Daily  . Chlorhexidine Gluconate Cloth  6 each Topical Daily  . clopidogrel  75 mg Oral Daily  . docusate sodium  200 mg Oral Daily  . famotidine  10 mg Oral Daily  . furosemide  40 mg Intravenous BID  . guaiFENesin  600 mg Oral BID  . insulin aspart  0-15 Units Subcutaneous TID WC  . insulin aspart  0-5 Units Subcutaneous QHS  . insulin aspart  4 Units Subcutaneous TID WC  . insulin detemir  40 Units Subcutaneous QHS  . isosorbide dinitrate  20 mg Oral TID  . levothyroxine  137 mcg Oral QAC breakfast  . metoprolol tartrate  25 mg Oral BID   Or  . metoprolol tartrate  25 mg Per Tube BID  . potassium chloride  20 mEq Oral BID  . spironolactone  25 mg Oral Daily   Continuous Infusions: . lactated ringers     PRN Meds: dextrose, lactated ringers, metoprolol tartrate, oxyCODONE   Vital Signs    Vitals:   06/23/20 2333 06/24/20 0441 06/24/20 0442 06/24/20 0719  BP: (!) 147/65 (!) 132/56  (!) 164/60  Pulse: 60 60  60  Resp: 19 18    Temp: 97.9 F (36.6 C) 98 F (36.7 C)  98.2 F (36.8 C)  TempSrc: Oral Oral  Oral  SpO2: 96% 99%  96%  Weight:   109.5 kg   Height:        Intake/Output Summary (Last 24 hours) at 06/24/2020 0902 Last data filed at 06/23/2020 2100 Gross per 24 hour  Intake --  Output 400 ml  Net -400 ml   Last 3 Weights 06/24/2020 06/23/2020 06/22/2020  Weight (lbs) 241 lb 6.4 oz 245 lb 11.2 oz 249 lb 5.4 oz  Weight (kg) 109.498 kg 111.449 kg 113.1 kg      Telemetry    SR - Personally Reviewed  ECG    No new tracing this morning  Physical  Exam  Pleasant older female GEN: No acute distress.   Neck: No JVD Cardiac: RRR, no murmurs, rubs, or gallops.  Respiratory: Clear to auscultation bilaterally. GI: Soft, nontender, non-distended  MS: 1+ LE edema; No deformity. Neuro:  Nonfocal  Psych: Normal affect   Labs    High Sensitivity Troponin:   Recent Labs  Lab 06/15/20 2134 06/16/20 0123 06/16/20 0232 06/16/20 0513 06/16/20 0736  TROPONINIHS 6,026* 9,657* 10,270* 10,433* 9,038*      Chemistry Recent Labs  Lab 06/22/20 0032 06/23/20 0139 06/24/20 0340  NA 133* 136 138  K 3.9 3.8 3.7  CL 99 101 103  CO2 22 23 20*  GLUCOSE 119* 121* 118*  BUN 16 18 17   CREATININE 1.09* 1.18* 1.32*  CALCIUM 8.6* 8.6* 8.4*  GFRNONAA 54* 49* 43*  ANIONGAP 12 12 15      Hematology Recent Labs  Lab 06/19/20 1739 06/20/20 0419 06/21/20 0218  WBC 11.9* 11.9* 13.7*  RBC 3.38* 3.38* 3.62*  HGB 9.8* 9.7* 10.3*  HCT 30.5* 30.2* 31.6*  MCV 90.2 89.3 87.3  MCH 29.0 28.7 28.5  MCHC 32.1 32.1 32.6  RDW 14.3 14.2 14.3  PLT 144* 139* 183    BNPNo results for input(s): BNP, PROBNP in the last 168 hours.   DDimer No results for input(s): DDIMER in the last 168 hours.   Radiology    No results found.  Cardiac Studies   Echo: 06/16/20  IMPRESSIONS    1. Left ventricular ejection fraction, by estimation, is 35 to 40%. The  left ventricle has moderately decreased function. The left ventricle  demonstrates regional wall motion abnormalities (see scoring  diagram/findings for description). There is mild  concentric left ventricular hypertrophy. Left ventricular diastolic  parameters are consistent with Grade II diastolic dysfunction  (pseudonormalization). Elevated left ventricular end-diastolic pressure.  2. Right ventricular systolic function is normal. The right ventricular  size is normal. There is mildly elevated pulmonary artery systolic  pressure.  3. Left atrial size was mildly dilated.  4. Right atrial  size was mildly dilated.  5. The mitral valve is normal in structure. Trivial mitral valve  regurgitation. No evidence of mitral stenosis.  6. The aortic valve is grossly normal. There is mild calcification of the  aortic valve. Aortic valve regurgitation is not visualized. No aortic  stenosis is present.  7. The inferior vena cava is dilated in size with <50% respiratory  variability, suggesting right atrial pressure of 15 mmHg.   Conclusion(s)/Recommendation(s): Reduced LVEF, focal wall motion  abnormalities as noted. No LV thrombus seen with contrast.   Patient Profile     74 y.o. female with PMH of hypertension, untreated OSA, and poorly-controlled type 2 diabetes complicated by neuropathywho presented with NSTEMI and taken for cath. Multivessel CAD s/p CABG.  Assessment & Plan    1. CAD with NSTEMI s/p CABG:progressing well post op. Treated with ASA/plavix, statin and BB. Working with CR. On isordil for radial harvest  2. Combined systolic/diastolic HF with post op volume overload: started on lasix yesterday, weight is down today 249>>245>>241lbs. I&Os not recorded. Would continue lasix as tolerated as she remains volume overloaded -- of note was on coreg 6.25mg  BID prior to admission. With reduced EF would favor transitioning back to coreg or switching to Toprol XL prior to discharge  3. Post op IPJ:[id number] with IV amiodarone, now transitioned to 200mg  BID. Remains in NSR.   4. HTN: borderline elevated. Unable to further titrate BB with bradycardia. On spiro 25mg  daily.  5. Acute combined systolic and diastolic HF:as above would agree with lasix. HR are low but tolerating low dose BB. Home ARB was losartan 100mg  daily, but Cr is elevated. Entresto consider but will need to wait and consider as an outpatient pending renal function.  6. DM: Hgb A1c 9.1 on admission. Treated with metformin, insulin 70-30 prior to admission.  -- cost check on Farxiga, HF PharmD contacted  to help with medication assistance prior to discharge. Will sign paper work for assistance today.  -- on SSI   7. HLD:on Zocor prior to admission. Now on atorvastatin 80mg .  -- FLP/LFTs in 8 weeks  For questions or updates, please contact [facility] Please consult [email or web address] for contact info under   Signed, , NP  06/24/2020, 9:02 AM    I have examined the patient and reviewed assessment and plan and discussed with patient.  Agree with above as stated.  DOing well.  Secondary prevention and RF modification including DM control is most important.  Trying to obtain cardioprotective meds  for her.     Lance Muss

## 2020-06-24 NOTE — Progress Notes (Signed)
      [address]       [place] [address]             [phone]      6 Days Post-Op Procedure(s) (LRB): CORONARY ARTERY BYPASS GRAFTING (CABG), ON PUMP, TIMES FOUR, USING LEFT INTERNAL MAMMARY ARTERY, ENDOSCOPICALLY HARVESTED RIGHT GREATER SAPHENOUS VEIN, AND LEFT RADIAL ARTERY (OPEN HARVEST) (N/A) RADIAL ARTERY HARVEST (Left) TRANSESOPHAGEAL ECHOCARDIOGRAM (TEE) (N/A) INDOCYANINE GREEN FLUORESCENCE IMAGING (ICG) (N/A) Subjective: Feels okay this morning. Still gets short of breath with ambulation.   Objective: Vital signs in last 24 hours: Temp:  [97.5 F (36.4 C)-98.2 F (36.8 C)] 98.2 F (36.8 C) (12/02 0719) Pulse Rate:  [54-60] 60 (12/02 0719) Cardiac Rhythm: Normal sinus rhythm;Sinus bradycardia (12/02 0400) Resp:  [18-20] 18 (12/02 0441) BP: (129-164)/(55-78) 164/60 (12/02 0719) SpO2:  [96 %-100 %] 96 % (12/02 0719) Weight:  [109.5 kg] 109.5 kg (12/02 0442)     Intake/Output from previous day: 12/01 0701 - 12/02 0700 In: -  Out: 400 [Urine:400] Intake/Output this shift: No intake/output data recorded.  General appearance: alert, cooperative and no distress Heart: NSR, no murmur Lungs: clear to auscultation bilaterally Abdomen: soft, non-tender; bowel sounds normal; no masses,  no organomegaly Extremities: 1-2+ pitting edema in lower ext Wound: clean and dry with prevena in place  Lab Results: No results for input(s): WBC, HGB, HCT, PLT in the last 72 hours. BMET: Recent Labs    06/23/20 0139 06/24/20 0340  NA 136 138  K 3.8 3.7  CL 101 103  CO2 23 20*  GLUCOSE 121* 118*  BUN 18 17  CREATININE 1.18* 1.32*  CALCIUM 8.6* 8.4*    PT/INR: No results for input(s): LABPROT, INR in the last 72 hours. ABG    Component Value Date/Time   PHART 7.269 (L) 06/18/2020 1828   HCO3 21.8 06/18/2020 1828   TCO2 23 06/18/2020 1828   ACIDBASEDEF 5.0 (H) 06/18/2020 1828   O2SAT 48.9 06/20/2020 1106   CBG (last 3)  Recent Labs     06/23/20 1759 06/23/20 2137 06/24/20 0611  GLUCAP 78 119* 104*    Assessment/Plan: S/P Procedure(s) (LRB): CORONARY ARTERY BYPASS GRAFTING (CABG), ON PUMP, TIMES FOUR, USING LEFT INTERNAL MAMMARY ARTERY, ENDOSCOPICALLY HARVESTED RIGHT GREATER SAPHENOUS VEIN, AND LEFT RADIAL ARTERY (OPEN HARVEST) (N/A) RADIAL ARTERY HARVEST (Left) TRANSESOPHAGEAL ECHOCARDIOGRAM (TEE) (N/A) INDOCYANINE GREEN FLUORESCENCE IMAGING (ICG) (N/A)  1. CV-NSR in the60s, On asa, statin, lopressor(holding parameters in place), and amio PO.Continue Amio to 200mg  BID due to bradycardia.Continue isordil for radial artery harvest. 2. Pulm-on room air with good saturation. Off and on shortness of breath per the patient. + cough with sputum, will add mucinex.Atelectasis on CXR. Continue IS. 3. Renal-creatinine1.32, electrolytes okay. Remains 7kgs above baseline weight, weight coming down. On spirolactone and lasix 40mg  IV BID 4. H and H stable 10.3/31.6, stable 5. GI- no n/v, eating small amounts 6. Blood glucose better controlled  Plan: Creatinine continues to climb a little but weight coming down with IV diuretics. Rhythm has been stable and EPW are out. Continue ambulation in the halls and use of incentive spirometer. Hopefully home tomorrow on oral lasix.    LOS: 8 days    06/24/2020

## 2020-06-24 NOTE — Progress Notes (Signed)
Heart Failure Patient Advocate Encounter  Completed [place] Program sent in an effort to reduce the patient's out of pocket expense for Farxiga to $0.    Application completed and faxed to [phone]   [place] patient [place] phone number for follow up is [phone].   Vora Clover Boothby, PharmD, BCPS Heart Failure Stewardship Pharmacist Phone [phone]  Please check [email or web address] for unit-specific pharmacist phone numbers  

## 2020-06-24 NOTE — Progress Notes (Signed)
Heart Failure Stewardship Pharmacist Progress Note   PCP: System, Provider Not In PCP-Cardiologist: Bryan Lemma, MD    HPI:  74 yo F with PMH of HTN, OSA, and T2DM. 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 ECHO done 06/16/20 and LVEF was reduced to 35-40%. S/p CABG on 06/18/20.  Current HF Medications: Furosemide 40 mg IV BID Metoprolol tartrate 25 mg BID Spironolactone 25 mg daily Isosorbide dinitrate 20 mg TID  Prior to admission HF Medications: Carvedilol 6.25 mg BID & metoprolol XL 25 mg daily  Losartan 100 mg daily Spironolactone 50 mg BID  Pertinent Lab Values: . Serum creatinine 1.32, BUN 17, Potassium 3.7, Sodium 138, Magnesium 2.2  Vital Signs: . Weight: 241.4 lbs o Admission weight: 226 lbs o Post-op weight: 247 lbs . Blood pressure: 140s-160s/60s  . Heart rate: 50-60s   Medication Assistance / Insurance Benefits Check: Does the patient have prescription insurance?  Yes Type of insurance plan: [employer] Medicare  Does the patient qualify for medication assistance through manufacturers or grants?   Yes . Eligible grants and/or patient assistance programs: [employer] . Medication assistance applications in progress: [employer] . Medication assistance applications approved: none  Approved medication assistance renewals will be completed by: [facility]  Outpatient Pharmacy:  Prior to admission outpatient pharmacy: [facility] Is the patient willing to use [facility] TOC pharmacy at discharge? Yes  Assessment: 1. Acute systolic CHF (EF 88-32%), due to ICM s/p CABG on 11/26. NYHA class II symptoms. - Continue furosemide 40 mg IV BID. Weight is still up 15 lbs from admission/pre-CABG weight. Plans to switch to oral at discharge tomorrow. I&O not documented. - Consider switching metoprolol tartrate to XL or carvedilol. Of note, patient reported taking both PTA, will educate to avoid duplicate  therapy. - Consider starting Entresto once SCr stable (1.09>1.18>1.32 today) rather than resuming losartan given low EF (<57%). - Continue spironolactone 25 mg daily - Continue isosorbide dinitrate 20 mg TID for both radial harvest + HFrEF management - Considering Farxiga prior to discharge   Plan: 1) Medication changes recommended at this time: - Continue current regimen at this time - Prior to discharge:   1) Switch metoprolol tartrate to XL or to carvedilol   2) Start Entresto 24/26 mg BID instead of PTA losartan   3) Start Farxiga 10 mg daily  2) Patient assistance application(s): - Patient assistance application for Marcelline Deist and Sherryll Burger have been completed. Farxiga application submitted. Will wait to submit Entresto application once initiated.  3)  Education  - To be completed prior to discharge  Theodis Sato, PharmD PGY-1 [facility] Pharmacy Resident   06/24/2020 10:14 AM

## 2020-06-24 NOTE — Progress Notes (Signed)
Mobility Specialist: Progress Note   06/24/20 1327  Mobility  Activity Ambulated in hall  Level of Assistance Modified independent, requires aide device or extra time  Assistive Device Front wheel walker  Distance Ambulated (ft) 220 ft  Mobility Response Tolerated well  Mobility performed by Mobility specialist  Bed Position Chair  $Mobility charge 1 Mobility   Pre-Mobility: 60 HR, 138/60 BP, 95% SpO2 Post-Mobility: 74 HR, 169/66 BP, 100% SpO2  Pt had no c/o during ambulation. Pt is progressing distance with each walk. Encouraged pt to walk one more time today.   [facility] Arlita Buffkin Mobility Specialist

## 2020-06-25 ENCOUNTER — Other Ambulatory Visit (HOSPITAL_COMMUNITY): Payer: Self-pay | Admitting: Physician Assistant

## 2020-06-25 LAB — BASIC METABOLIC PANEL
Anion gap: 12 (ref 5–15)
BUN: 18 mg/dL (ref 8–23)
CO2: 25 mmol/L (ref 22–32)
Calcium: 8.8 mg/dL — ABNORMAL LOW (ref 8.9–10.3)
Chloride: 103 mmol/L (ref 98–111)
Creatinine, Ser: 1.41 mg/dL — ABNORMAL HIGH (ref 0.44–1.00)
GFR, Estimated: 39 mL/min — ABNORMAL LOW (ref >60)
Glucose, Bld: 98 mg/dL (ref 70–99)
Potassium: 4.2 mmol/L (ref 3.5–5.1)
Sodium: 140 mmol/L (ref 135–145)

## 2020-06-25 LAB — GLUCOSE, CAPILLARY
Glucose-Capillary: 175 mg/dL — ABNORMAL HIGH (ref 70–99)
Glucose-Capillary: 221 mg/dL — ABNORMAL HIGH (ref 70–99)

## 2020-06-25 MED ORDER — METOPROLOL SUCCINATE ER 50 MG PO TB24: 50.0000 mg | ORAL_TABLET | Freq: Every day | ORAL | 3 refills | Status: AC

## 2020-06-25 MED ORDER — TRAMADOL HCL 50 MG PO TABS
50.0000 mg | ORAL_TABLET | Freq: Three times a day (TID) | ORAL | 0 refills | Status: DC | PRN
Start: 2020-06-25 — End: 2020-06-25

## 2020-06-25 MED ORDER — DAPAGLIFLOZIN PROPANEDIOL 10 MG PO TABS: 10.0000 mg | ORAL_TABLET | Freq: Every day | ORAL | 1 refills | Status: DC

## 2020-06-25 MED ORDER — ATORVASTATIN CALCIUM 80 MG PO TABS: 80.0000 mg | ORAL_TABLET | Freq: Every day | ORAL | 1 refills | Status: AC

## 2020-06-25 MED ORDER — CLOPIDOGREL BISULFATE 75 MG PO TABS: 75.0000 mg | ORAL_TABLET | Freq: Every day | ORAL | 1 refills | Status: AC

## 2020-06-25 MED ORDER — POTASSIUM CHLORIDE CRYS ER 20 MEQ PO TBCR
20.0000 meq | EXTENDED_RELEASE_TABLET | Freq: Every day | ORAL | 0 refills | Status: DC
Start: 2020-06-25 — End: 2020-07-14

## 2020-06-25 MED ORDER — ISOSORBIDE DINITRATE 20 MG PO TABS
20.0000 mg | ORAL_TABLET | Freq: Three times a day (TID) | ORAL | 0 refills | Status: DC
Start: 2020-06-25 — End: 2020-07-14

## 2020-06-25 MED ORDER — FUROSEMIDE 40 MG PO TABS: 40.0000 mg | ORAL_TABLET | Freq: Every day | ORAL | 0 refills | Status: DC

## 2020-06-25 MED ORDER — AMIODARONE HCL 200 MG PO TABS
200.0000 mg | ORAL_TABLET | Freq: Two times a day (BID) | ORAL | 1 refills | Status: DC
Start: 2020-06-25 — End: 2020-07-01

## 2020-06-25 MED ORDER — METOPROLOL TARTRATE 25 MG PO TABS
25.0000 mg | ORAL_TABLET | Freq: Two times a day (BID) | ORAL | 1 refills | Status: DC
Start: 2020-06-25 — End: 2020-06-25

## 2020-06-25 MED FILL — traMADol HCL 50 MG TABS: 50 | 7 days supply | Qty: 21 | Fill #0

## 2020-06-25 MED FILL — ISOSORBIDE DN 20 MG TABLET: 20 | 30 days supply | Qty: 90 | Fill #0

## 2020-06-25 MED FILL — METOPROLOL SUCCINATE ER 50: 50 | 30 days supply | Qty: 30 | Fill #0

## 2020-06-25 MED FILL — POTASSIUM CHLORIDE 20meqER: 20 | 7 days supply | Qty: 7 | Fill #0

## 2020-06-25 MED FILL — ATORVASTATIN CALCIUM 80 MG: 80 | 30 days supply | Qty: 30 | Fill #0

## 2020-06-25 MED FILL — CLOPIDOGREL 75 MG TABLET: 75 | 30 days supply | Qty: 30 | Fill #0

## 2020-06-25 MED FILL — FUROSEMIDE 40 MG TABLET: 40 | 7 days supply | Qty: 7 | Fill #0

## 2020-06-25 MED FILL — FARXIGA 10 MG TABLET: 10 | 30 days supply | Qty: 30 | Fill #0

## 2020-06-25 MED FILL — AMIODARONE HCL 200 MG TAB: 200 | 30 days supply | Qty: 60 | Fill #0

## 2020-06-25 NOTE — Progress Notes (Addendum)
Heart Failure Stewardship Pharmacist Progress Note   PCP: System, Provider Not In PCP-Cardiologist: Bryan Lemma, MD    HPI:  74 yo F with PMH of HTN, OSA, and T2DM. 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 ECHO done 06/16/20 and LVEF was reduced to 35-40%. S/p CABG on 06/18/20.  Discharge HF Medications: Furosemide 40 mg daily Metoprolol tartrate 25 mg BID Spironolactone 25 mg daily Farxiga 10 mg daily Isosorbide dinitrate 20 mg TID  Prior to admission HF Medications: Carvedilol 6.25 mg BID & metoprolol XL 25 mg daily  Losartan 100 mg daily Spironolactone 50 mg BID  Pertinent Lab Values: . Serum creatinine 1.41, BUN 18, Potassium 4.2, Sodium 140  Vital Signs: . Weight: 237 lbs o Admission weight: 226 lbs o Post-op weight: 247 lbs . Blood pressure: 130/70s  . Heart rate: 50s   Medication Assistance / Insurance Benefits Check: Does the patient have prescription insurance?  Yes Type of insurance plan: [employer] Medicare  Does the patient qualify for medication assistance through manufacturers or grants?   Yes . Eligible grants and/or patient assistance programs: Clifton Custard . Medication assistance applications in progress: [employer] . Medication assistance applications approved: none  Approved medication assistance renewals will be completed by: [facility]  Outpatient Pharmacy:  Prior to admission outpatient pharmacy: [facility] Is the patient willing to use [facility] TOC pharmacy at discharge? Yes  Assessment: 1. Acute systolic CHF (EF 16-10%), due to ICM s/p CABG on 11/26. NYHA class II symptoms. - Continue furosemide 40 mg daily at discharge - Consider switching metoprolol tartrate to XL for discharge (tartrate formulation is not approved HF BB). Patient has been educated to stop taking carvedilol - Consider starting Entresto once SCr stable (1.32>1.41 today) rather than resuming losartan given  low EF (<57%). - Continue spironolactone 25 mg daily - Continue isosorbide dinitrate 20 mg TID for both radial harvest + HFrEF management - Continue Farxiga 10 mg daily   Plan: 1) Medication changes recommended at this time: - Switch metoprolol tartrate to XL or to carvedilol  2) Medication changes recommended at follow up visit: - Start Entresto 24/26 mg BID  3) Patient assistance application(s): - Patient assistance application for Marcelline Deist and Sherryll Burger have been completed. Farxiga application submitted. Will wait to submit Entresto application once initiated.  4) HF education - Patient has been educated on current HF medications (lasix, metoprolol, spironolactone Farxiga, isordil) and potential additions to HF medication regimen Sherryll Burger) - Patient verbalizes understanding that over the next few months, these medication doses may change and more medications may be added to optimize HF regimen - Patient has been educated on basic disease state pathophysiology and goals of therapy - Time spent 30 mins  Sharen Hones, PharmD, BCPS Heart Failure Stewardship Pharmacist Phone [phone]  Please check [email or web address] for unit-specific pharmacist phone numbers

## 2020-06-25 NOTE — Progress Notes (Signed)
      [address]       [place] [address]             [phone]      7 Days Post-Op Procedure(s) (LRB): CORONARY ARTERY BYPASS GRAFTING (CABG), ON PUMP, TIMES FOUR, USING LEFT INTERNAL MAMMARY ARTERY, ENDOSCOPICALLY HARVESTED RIGHT GREATER SAPHENOUS VEIN, AND LEFT RADIAL ARTERY (OPEN HARVEST) (N/A) RADIAL ARTERY HARVEST (Left) TRANSESOPHAGEAL ECHOCARDIOGRAM (TEE) (N/A) INDOCYANINE GREEN FLUORESCENCE IMAGING (ICG) (N/A) Subjective: Feels okay this morning.   Objective: Vital signs in last 24 hours: Temp:  [97.7 F (36.5 C)-98.3 F (36.8 C)] 97.8 F (36.6 C) (12/03 0351) Pulse Rate:  [54-61] 59 (12/03 0351) Cardiac Rhythm: Normal sinus rhythm;Sinus bradycardia (12/03 0340) Resp:  [19-20] 19 (12/03 0351) BP: (108-161)/(57-68) 133/61 (12/03 0351) SpO2:  [94 %-100 %] 97 % (12/03 0351) Weight:  [107.9 kg] 107.9 kg (12/03 0351)     Intake/Output from previous day: 12/02 0701 - 12/03 0700 In: 480 [P.O.:480] Out: 4750 [Urine:4750] Intake/Output this shift: No intake/output data recorded.  General appearance: alert, cooperative and no distress Heart: sinus bradycardia Lungs: clear to auscultation bilaterally Abdomen: soft, non-tender; bowel sounds normal; no masses,  no organomegaly Extremities: extremities normal, atraumatic, no cyanosis or edema Wound: clean and dry  Lab Results: No results for input(s): WBC, HGB, HCT, PLT in the last 72 hours. BMET: Recent Labs    06/24/20 0340 06/25/20 0144  NA 138 140  K 3.7 4.2  CL 103 103  CO2 20* 25  GLUCOSE 118* 98  BUN 17 18  CREATININE 1.32* 1.41*  CALCIUM 8.4* 8.8*    PT/INR: No results for input(s): LABPROT, INR in the last 72 hours. ABG    Component Value Date/Time   PHART 7.269 (L) 06/18/2020 1828   HCO3 21.8 06/18/2020 1828   TCO2 23 06/18/2020 1828   ACIDBASEDEF 5.0 (H) 06/18/2020 1828   O2SAT 48.9 06/20/2020 1106   CBG (last 3)  Recent Labs    06/24/20 1649 06/24/20 2143  06/25/20 0542  GLUCAP 173* 170* 175*    Assessment/Plan: S/P Procedure(s) (LRB): CORONARY ARTERY BYPASS GRAFTING (CABG), ON PUMP, TIMES FOUR, USING LEFT INTERNAL MAMMARY ARTERY, ENDOSCOPICALLY HARVESTED RIGHT GREATER SAPHENOUS VEIN, AND LEFT RADIAL ARTERY (OPEN HARVEST) (N/A) RADIAL ARTERY HARVEST (Left) TRANSESOPHAGEAL ECHOCARDIOGRAM (TEE) (N/A) INDOCYANINE GREEN FLUORESCENCE IMAGING (ICG) (N/A)  1. CV-NSR in the60s, On asa, statin, lopressor(holding parameters in place), and amio PO.ContinueAmio to 200mg  BID due to bradycardia.Continue isordil for radial artery harvest. 2. Pulm-on room air with good saturation. Off and on shortness of breath per the patient. + cough with sputum, will add mucinex.Atelectasis on CXR. Continue IS. 3. Renal-creatinine1.41, electrolytes okay. Remains5kgsabove baseline weight, weight coming down. On spirolactone and lasix 40mg  IV BID 4. H and H stable 10.3/31.6, stable 5. GI- no n/v, eating small amounts 6. Blood glucosebetter controlled  Plan: Possibly home today if okay with Dr. . Would discharge on lasix 40mg  daily. Also, would encourage f/u with cardiology and a BMP for renal function.    LOS: 9 days    04-25-1972 06/25/2020

## 2020-06-25 NOTE — TOC Transition Note (Signed)
Transition of Care (TOC) - CM/SW Discharge Note Donn Pierini RN, BSN Transitions of Care Unit 4E- RN Case Manager See Treatment Team for direct phone #    Patient Details  Name: Angela Frazier MRN: [id number] Date of Birth: 1945/10/01  Transition of Care [facility]) CM/SW Contact:  Darrold Span, RN Phone Number: 06/25/2020, 11:19 AM   Clinical Narrative:    Pt stable for transition home today, 3n1 has been delivered to room for home. HH arrangements in place for HHPT with [facility] to f/u with pt post discharge.  Pt to stay with son in [place] for a while- address has been provided to [facility] for start of care   Final next level of care: Home w Home Health Services Barriers to Discharge: Barriers Resolved   Patient Goals and CMS Choice Patient states their goals for this hospitalization and ongoing recovery are:: return home, get stronger CMS Medicare.gov Compare Post Acute Care list provided to:: Patient Choice offered to / list presented to : Patient  Discharge Placement                 Home with [facility]      Discharge Plan and Services   Discharge Planning Services: CM Consult Post Acute Care Choice: Durable Medical Equipment, Home Health          DME Arranged: 3-N-1, Walker rolling DME Agency: [employer] Date DME Agency Contacted: 06/22/20 Time DME Agency Contacted: [phone] Representative spoke with at DME Agency: Silvio Pate HH Arranged: PT HH Agency: [facility] Date [facility] Agency Contacted: 06/22/20 Time HH Agency Contacted: 1641 Representative spoke with at [facility] Agency: Britney  Social Determinants of Health (SDOH) Interventions     Readmission Risk Interventions Readmission Risk Prevention Plan 06/25/2020  Transportation Screening Complete  PCP or Specialist Appt within 5-7 Days Complete  Home Care Screening Complete  Medication Review (RN CM) Complete  Some recent data might be hidden

## 2020-06-25 NOTE — Care Management Important Message (Signed)
Important Message  Patient Details  Name: Angela Frazier MRN: [id number] Date of Birth: 06/20/1946   Medicare Important Message Given:  Yes     Dionis Autry P Leanne Sisler 06/25/2020, 3:00 PM

## 2020-06-25 NOTE — Progress Notes (Signed)
[id number] Education completed with pt who voiced understanding. Stressed importance of adhering to sternal precautions and staying in the tube. Pt has rolling walker at home to use. Gave diabetic and heart healthy diets and discussed carb counting. Gave walking instructions for exercise. Discussed wound care. Encouraged IS. Discussed CRP 2 and referring to [facility]. Pt did not want to walk at this time. For discharge today. Luetta Nutting RN BSN 06/25/2020 9:54 AM

## 2020-06-25 NOTE — Progress Notes (Signed)
Mobility Specialist: Progress Note   06/25/20 1103  Mobility  Activity Refused mobility   Pt refused mobility stating she is discharging around noon.   [facility] Adalbert Alberto Mobility Specialist

## 2020-06-25 NOTE — Progress Notes (Signed)
Changed metoprolol tartrated 25 mg BID to 50 mg metoprolol succinate for CHF. Sent to [facility] pharmacy.

## 2020-06-25 NOTE — Progress Notes (Signed)
Physical Therapy Treatment Patient Details Name: Angela Frazier MRN: [id number] DOB: Dec 06, 1945 Today's Date: 06/25/2020    History of Present Illness Pt is a 74 y/o F presenting to the ED on 11/23 for chest pain, nausea, and radiating pain in L arm with NSTEMI. 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 PMH includes HTN, hyperlipidemia, OSA, hypothyroidism, asthma, and DM II with neuropathy.    PT Comments    Pt seated in recliner and eager to return home.  Pt assisted with upper and lower body dressing.  She performed gt and stair training with decreased assistance.  Continues to require safety with sternal precautions.  Educated to continue ambulation at home and IS use.      Follow Up Recommendations  Home health PT;Supervision/Assistance - 24 hour     Equipment Recommendations  Rolling walker with 5" wheels;3in1 (PT)    Recommendations for Other Services       Precautions / Restrictions Precautions Precautions: Sternal Precaution Booklet Issued: Yes (comment) Precaution Comments: reviewed verbally with patient Restrictions Weight Bearing Restrictions: Yes (sternal precautions)    Mobility  Bed Mobility               General bed mobility comments: Pt seated in recliner chair on arrival this session.  Transfers Overall transfer level: Needs assistance Equipment used: None Transfers: Sit to/from Stand Sit to Stand: Supervision         General transfer comment: Pt able to recall hand placement to come to standing but cues to keep hands in tube when returning to sit.  Continues to use rocking momentum to achieve standing.  Ambulation/Gait Ambulation/Gait assistance: Supervision Gait Distance (Feet): 100 Feet Assistive device: Rolling walker (2 wheeled) Gait Pattern/deviations: Step-through pattern;Decreased stride length Gait velocity: slow   General Gait Details: WOB noted with gt but able to tolerated without seated rest period.   Stairs Stairs:  Yes Stairs assistance: Min guard Stair Management: One rail Right Number of Stairs: 2 General stair comments: Cues for sequencing and precautions using R rail for balance.  Decreased assistance this session.   Wheelchair Mobility    Modified Rankin (Stroke Patients Only)       Balance Overall balance assessment: Needs assistance   Sitting balance-Leahy Scale: Good       Standing balance-Leahy Scale: Fair Standing balance comment: unilateral support on RW                            Cognition Arousal/Alertness: Awake/alert Behavior During Therapy: WFL for tasks assessed/performed;Anxious Overall Cognitive Status: Within Functional Limits for tasks assessed                                        Exercises Other Exercises Other Exercises: IS x 10 reps    General Comments        Pertinent Vitals/Pain Pain Assessment: No/denies pain    Home Living                      Prior Function            PT Goals (current goals can now be found in the care plan section) Acute Rehab PT Goals Patient Stated Goal: to get stronger Potential to Achieve Goals: Good Progress towards PT goals: Progressing toward goals    Frequency  Min 3X/week      PT Plan Current plan remains appropriate    Co-evaluation              AM-PAC PT "6 Clicks" Mobility   Outcome Measure  Help needed turning from your back to your side while in a flat bed without using bedrails?: A Little Help needed moving from lying on your back to sitting on the side of a flat bed without using bedrails?: A Little Help needed moving to and from a bed to a chair (including a wheelchair)?: None Help needed standing up from a chair using your arms (e.g., wheelchair or bedside chair)?: None Help needed to walk in hospital room?: None Help needed climbing 3-5 steps with a railing? : A Little 6 Click Score: 21    End of Session   Activity Tolerance:  Patient tolerated treatment well Patient left: in chair;with call bell/phone within reach;with nursing/sitter in room Nurse Communication: Mobility status PT Visit Diagnosis: Other abnormalities of gait and mobility (R26.89)     Time: 8343-7357 PT Time Calculation (min) (ACUTE ONLY): 17 min  Charges:  $Gait Training: 8-22 mins                     Bonney Leitz , PTA Acute Rehabilitation Services Pager [phone] Office [phone]     Angela Frazier Artis Delay 06/25/2020, 11:42 AM

## 2020-06-25 NOTE — Progress Notes (Signed)
Discharge orders received.  IV and telemetry removed, CCMD notified. Discharge education and medications reviewed. Pt expresses understanding.  Ride expected to be here around noon.

## 2020-06-29 ENCOUNTER — Telehealth (HOSPITAL_COMMUNITY): Payer: Self-pay

## 2020-06-29 NOTE — Telephone Encounter (Signed)
Contacted [employer] to follow up on pt assistance application submitted on 12/2 for Farxiga.  Application has been received but is still pending - they are in the process of updating and verifying provider SLN.  Will continue to follow

## 2020-06-30 NOTE — Telephone Encounter (Signed)
[employer] application denied. Income verification system through third-party agency found that income was too high for enrollment into the program.  I have left a message for Angela Frazier to call me back. Will need to submit most recent social security statement to dispute denial.  Will continue to follow.

## 2020-07-01 ENCOUNTER — Encounter: Payer: Self-pay | Admitting: Physician Assistant

## 2020-07-01 ENCOUNTER — Other Ambulatory Visit: Payer: Self-pay

## 2020-07-01 ENCOUNTER — Ambulatory Visit: Payer: Medicare HMO | Admitting: Physician Assistant

## 2020-07-01 VITALS — BP 138/78 | HR 48 | Ht 65.0 in | Wt 225.0 lb

## 2020-07-01 DIAGNOSIS — I214 Non-ST elevation (NSTEMI) myocardial infarction: Secondary | ICD-10-CM

## 2020-07-01 DIAGNOSIS — Z951 Presence of aortocoronary bypass graft: Secondary | ICD-10-CM

## 2020-07-01 DIAGNOSIS — I25700 Atherosclerosis of coronary artery bypass graft(s), unspecified, with unstable angina pectoris: Secondary | ICD-10-CM | POA: Diagnosis not present

## 2020-07-01 DIAGNOSIS — R001 Bradycardia, unspecified: Secondary | ICD-10-CM

## 2020-07-01 DIAGNOSIS — I480 Paroxysmal atrial fibrillation: Secondary | ICD-10-CM

## 2020-07-01 DIAGNOSIS — I10 Essential (primary) hypertension: Secondary | ICD-10-CM | POA: Diagnosis not present

## 2020-07-01 DIAGNOSIS — I5042 Chronic combined systolic (congestive) and diastolic (congestive) heart failure: Secondary | ICD-10-CM

## 2020-07-01 MED ORDER — AMIODARONE HCL 200 MG PO TABS: 200.0000 mg | ORAL_TABLET | Freq: Every day | ORAL | 3 refills | Status: DC

## 2020-07-01 MED ORDER — FUROSEMIDE 40 MG PO TABS: 40.0000 mg | ORAL_TABLET | Freq: Every day | ORAL | 0 refills | Status: DC

## 2020-07-01 NOTE — Patient Instructions (Addendum)
Medication Instructions:  Your physician has recommended you make the following change in your medication:  1.  REDUCE the Amiodarone to 1 tablet daily 2.  CONTINUE taking the Lasix 40 mg daily.  I have sent in a refill for this to [facility].    Based upon your lab results, you may start Entresto 24/26 mg twice a day.  WE WILL ADVISE ONCE WE GET YOUR LABS BACK.  YOU WILL BE GIVEN 2 WEEKS OF SAMPLES.. DO NOT START UNTIL YOU ARE INSTRUCTED TO DO SO.    *If you need a refill on your cardiac medications before your next appointment, please call your pharmacy*   Lab Work: None ordered  If you have labs (blood work) drawn today and your tests are completely normal, you will receive your results only by: Marland Kitchen MyChart Message (if you have MyChart) OR . A paper copy in the mail If you have any lab test that is abnormal or we need to change your treatment, we will call you to review the results.   Testing/Procedures: None ordered   Follow-Up: At [facility], you and your health needs are our priority.  As part of our continuing mission to provide you with exceptional heart care, we have created designated Provider Care Teams.  These Care Teams include your primary Cardiologist (physician) and Advanced Practice Providers (APPs -  Physician Assistants and Nurse Practitioners) who all work together to provide you with the care you need, when you need it.  We recommend signing up for the patient portal called "MyChart".  Sign up information is provided on this After Visit Summary.  MyChart is used to connect with patients for Virtual Visits (Telemedicine).  Patients are able to view lab/test results, encounter notes, upcoming appointments, etc.  Non-urgent messages can be sent to your provider as well.   To learn more about what you can do with MyChart, go to [email or web address].    Your next appointment:   2 WEEKS 07/14/2020 ARRIVE AT 11:00  The format for your next appointment:   In  Person  Provider:   You may see Bryan Lemma, MD or one of the following Advanced Practice Providers on your designated Care Team:    Azalee Course, [place]    Other Instructions

## 2020-07-01 NOTE — Addendum Note (Signed)
Addended by: Burnetta Sabin on: 07/01/2020 12:20 PM   Modules accepted: Orders

## 2020-07-01 NOTE — Progress Notes (Addendum)
Cardiology Office Note:    Date:  07/01/2020   ID:  Angela Frazier, DOB 04-Apr-1946, MRN [id number]  PCP:  Zoila Shutter, MD  [facility] Cardiologist:  Bryan Lemma, MD  [facility] Electrophysiologist:  None   Chief Complaint: Hospital follow up   History of Present Illness:    Angela Frazier is a 74 y.o. female presents for hospital follow up.   PMH of hypertension, untreated OSA, and poorly-controlled type 2 diabetes complicated by neuropathywho recently admitted with NSTEMI and taken for cath showing multivessel CAD s/p CABG. On isordil for radial harvest. Had post op atrial fibrillation.  She was placed on IV amiodarone which spontaneous cardioversion to sinus rhythm.  Transition to p.o. amiodarone 200 mg twice daily.  Echocardiogram with LV function at 35 to 40% and grade 2 diastolic dysfunction.  Unable to titrate beta-blocker secondary to baseline bradycardia.  Placed on spironolactone.   Here today for follow up with son.  Patient reports recovering well.  She does have intermittent dizziness.  Heart rate sustains in 40 to 50s.  She is currently taking spironolactone 50 mg twice daily which was her regular dose prior to admission.  However She was getting spironolactone 25 mg during admission.  Losartan hold at discharge.  She denies orthopnea or PND.  Still has lower extremity edema.  Weight has been going down.  Denies chest pain, palpitation, syncope or melena.  Past Medical History:  Diagnosis Date  . Arthritis   . Asthma   . Diabetes mellitus without complication (HCC)   . Hypercholesteremia   . Hypertension   . Hypothyroidism     Past Surgical History:  Procedure Laterality Date  . CORONARY ARTERY BYPASS GRAFT N/A 06/18/2020   Procedure: CORONARY ARTERY BYPASS GRAFTING (CABG), ON PUMP, TIMES FOUR, USING LEFT INTERNAL MAMMARY ARTERY, ENDOSCOPICALLY HARVESTED RIGHT GREATER SAPHENOUS VEIN, AND LEFT RADIAL ARTERY (OPEN HARVEST);  Surgeon: Linden Dolin, MD;   Location: [facility] OR;  Service: Open Heart Surgery;  Laterality: N/A;  . LEFT HEART CATH AND CORONARY ANGIOGRAPHY N/A 06/16/2020   Procedure: LEFT HEART CATH AND CORONARY ANGIOGRAPHY;  Surgeon: Corky Crafts, MD;  Location: [facility] INVASIVE CV LAB;  Service: Cardiovascular;  Laterality: N/A;  . RADIAL ARTERY HARVEST Left 06/18/2020   Procedure: RADIAL ARTERY HARVEST;  Surgeon: Linden Dolin, MD;  Location: [facility] OR;  Service: Open Heart Surgery;  Laterality: Left;  . TEE WITHOUT CARDIOVERSION N/A 06/18/2020   Procedure: TRANSESOPHAGEAL ECHOCARDIOGRAM (TEE);  Surgeon: Linden Dolin, MD;  Location: [facility] OR;  Service: Open Heart Surgery;  Laterality: N/A;  . THYROID SURGERY    . TUBAL LIGATION      Current Medications: Current Meds  Medication Sig  . acetaminophen (TYLENOL) 500 MG tablet Take 500 mg by mouth every 6 (six) hours as needed for mild pain, fever or headache.  . albuterol (PROVENTIL HFA;VENTOLIN HFA) 108 (90 Base) MCG/ACT inhaler Inhale 2 puffs into the lungs every 6 (six) hours as needed for wheezing or shortness of breath.  Marland Kitchen aspirin 81 MG tablet Take 81 mg by mouth daily.  Marland Kitchen atorvastatin (LIPITOR) 80 MG tablet Take 1 tablet (80 mg total) by mouth daily.  . Cholecalciferol (VITAMIN D) 50 MCG (2000 UT) CAPS Take 2,000 Units by mouth daily.  . clopidogrel (PLAVIX) 75 MG tablet Take 1 tablet (75 mg total) by mouth daily.  . Cyanocobalamin (B-12 PO) Take 1 tablet by mouth daily.  . dapagliflozin propanediol (FARXIGA) 10 MG TABS tablet Take 1 tablet (  10 mg total) by mouth daily.  Marland Kitchen escitalopram (LEXAPRO) 20 MG tablet Take 20 mg by mouth daily.  . fluticasone (FLONASE) 50 MCG/ACT nasal spray Place 1 spray into both nostrils daily.  . furosemide (LASIX) 40 MG tablet Take 1 tablet (40 mg total) by mouth daily.  Marland Kitchen gabapentin (NEURONTIN) 100 MG capsule Take 300 mg by mouth at bedtime.  . isosorbide dinitrate (ISORDIL) 20 MG tablet Take 1 tablet (20 mg total) by mouth 3 (three) times daily.   Marland Kitchen levothyroxine (SYNTHROID) 137 MCG tablet Take 137 mcg by mouth daily.  . metFORMIN (GLUCOPHAGE-XR) 500 MG 24 hr tablet Take 1,000 mg by mouth 2 (two) times daily.  . metoprolol succinate (TOPROL XL) 50 MG 24 hr tablet Take 1 tablet (50 mg total) by mouth daily.  . montelukast (SINGULAIR) 10 MG tablet Take 10 mg by mouth at bedtime.  Marland Kitchen NOVOLOG MIX 70/30 FLEXPEN (70-30) 100 UNIT/ML FlexPen Inject 0-70 Units into the skin 2 (two) times daily.  . polyvinyl alcohol (LIQUIFILM TEARS) 1.4 % ophthalmic solution Place 1 drop into both eyes as needed for dry eyes.  . potassium chloride SA (KLOR-CON) 20 MEQ tablet Take 1 tablet (20 mEq total) by mouth daily.  Marland Kitchen spironolactone (ALDACTONE) 25 MG tablet Take 50 mg by mouth 2 (two) times daily.   . SYMBICORT 80-4.5 MCG/ACT inhaler Inhale 2 puffs into the lungs daily.  . traMADol (ULTRAM) 50 MG tablet Take 1 tablet (50 mg total) by mouth every 8 (eight) hours as needed.  . [DISCONTINUED] amiodarone (PACERONE) 200 MG tablet Take 1 tablet (200 mg total) by mouth 2 (two) times daily.     Allergies:   Cefaclor, Codeine, Penicillins, Sulfa antibiotics, Trazodone and nefazodone, Vibramycin [doxycycline calcium], and Erythromycin   Social History   Socioeconomic History  . Marital status: Widowed    Spouse name: Not on file  . Number of children: Not on file  . Years of education: Not on file  . Highest education level: Not on file  Occupational History  . Not on file  Tobacco Use  . Smoking status: Never Smoker  . Smokeless tobacco: Never Used  Vaping Use  . Vaping Use: Never used  Substance and Sexual Activity  . Alcohol use: No  . Drug use: Never  . Sexual activity: Not on file  Other Topics Concern  . Not on file  Social History Narrative  . Not on file   Social Determinants of Health   Financial Resource Strain: Not on file  Food Insecurity: Not on file  Transportation Needs: Not on file  Physical Activity: Not on file  Stress: Not on  file  Social Connections: Not on file     Family History: The patient's family history is not on file.    ROS:   Please see the history of present illness.    All other systems reviewed and are negative.   EKGs/Labs/Other Studies Reviewed:    The following studies were reviewed today:  CORONARY ARTERY BYPASS GRAFTING (CABG), ON PUMP, TIMES FOUR, USING LEFT INTERNAL MAMMARY ARTERY, ENDOSCOPICALLY HARVESTED RIGHT GREATER SAPHENOUS VEIN, AND LEFT RADIAL ARTERY (OPEN HARVEST) (N/A) RADIAL ARTERY HARVEST (Left) TRANSESOPHAGEAL ECHOCARDIOGRAM (TEE) (N/A) INDOCYANINE GREEN FLUORESCENCE IMAGING (ICG) (N/A)  Echo 06/16/2020 1. Left ventricular ejection fraction, by estimation, is 35 to 40%. The  left ventricle has moderately decreased function. The left ventricle  demonstrates regional wall motion abnormalities (see scoring  diagram/findings for description). There is mild  concentric left ventricular hypertrophy. Left  ventricular diastolic  parameters are consistent with Grade II diastolic dysfunction  (pseudonormalization). Elevated left ventricular end-diastolic pressure.  2. Right ventricular systolic function is normal. The right ventricular  size is normal. There is mildly elevated pulmonary artery systolic  pressure.  3. Left atrial size was mildly dilated.  4. Right atrial size was mildly dilated.  5. The mitral valve is normal in structure. Trivial mitral valve  regurgitation. No evidence of mitral stenosis.  6. The aortic valve is grossly normal. There is mild calcification of the  aortic valve. Aortic valve regurgitation is not visualized. No aortic  stenosis is present.  7. The inferior vena cava is dilated in size with <50% respiratory  variability, suggesting right atrial pressure of 15 mmHg.   LEFT HEART CATH AND CORONARY ANGIOGRAPHY  06/16/2020    Conclusion    Dist RCA lesion is 100% stenosed. Left to right collaterals.  Mid RCA lesion is 50%  stenosed.  Prox Cx lesion is 80% stenosed.  Mid Cx lesion is 80% stenosed.  1st Mrg lesion is 75% stenosed. This is a small vessel and may not be a target for CABG. The OM2 is a large vessel that is likely a better bypass target.  Ost LAD to Prox LAD lesion is 90% stenosed.  There is moderate left ventricular systolic dysfunction.  LV end diastolic pressure is moderately elevated.  The left ventricular ejection fraction is 35-45% by visual estimate.  There is no aortic valve stenosis.  RPDA lesion is 80% stenosed.  RPAV lesion is 80% stenosed.   Severe three vessel disease.  Will obtain CVTS consult for CABG.  Medical therapy for LV dysfunction.   Likely will need grafts to LAD, OM2, PDA, PLA    EKG:  EKG is ordered today.  The ekg ordered today demonstrates sinus bradycardia at rate of 48 bpm, inferior anterior lateral T wave inversion  Recent Labs: 06/16/2020: ALT 16 06/19/2020: Magnesium 2.2 06/21/2020: Hemoglobin 10.3; Platelets 183 06/25/2020: BUN 18; Creatinine, Ser 1.41; Potassium 4.2; Sodium 140  Recent Lipid Panel    Component Value Date/Time   CHOL 139 06/16/2020 0513   TRIG 116 06/16/2020 0513   HDL 34 (L) 06/16/2020 0513   CHOLHDL 4.1 06/16/2020 0513   VLDL 23 06/16/2020 0513   LDLCALC 82 06/16/2020 0513   Physical Exam:    VS:  BP 138/78   Pulse (!) 48   Ht 5\' 5"  (1.651 m)   Wt 225 lb 0.6 oz (102.1 kg)   SpO2 96%   BMI 37.45 kg/m     Wt Readings from Last 3 Encounters:  07/01/20 225 lb 0.6 oz (102.1 kg)  06/25/20 237 lb 14.4 oz (107.9 kg)  09/23/15 224 lb (101.6 kg)     GEN: Well nourished, well developed in no acute distress HEENT: Normal NECK: No JVD; No carotid bruits LYMPHATICS: No lymphadenopathy CARDIAC: Regular slow, no murmurs, rubs, gallops.  Surgical staple RESPIRATORY:  Clear to auscultation without rales, wheezing or rhonchi  ABDOMEN: Soft, non-tender, non-distended MUSCULOSKELETAL:  1-2 + BL LE edema; No deformity  SKIN:  Warm and dry NEUROLOGIC:  Alert and oriented x 3 PSYCHIATRIC:  Normal affect   ASSESSMENT AND PLAN:    1. CAD s/p CABG -No angina.  Continue aspirin, Plavix, statin and beta-blocker  2.  Chronic combined CHF -Patient reported is going down.  No orthopnea or PND.  Still has 1+ bilateral lower extremity edema. -Her renal function was elevated in hospital.  Check BMP today. -Continue  Lasix 40 mg daily -Continue Toprol-XL 50 mg daily (see below) -Based on renal function reduce spironolactone and add Entresto (no sample available at [facility] office, given 30-day free supply coupon).  - Continue Farxiga  3.  Postop atrial fibrillation/sinus bradycardia -Reported intermittent dizziness and heart rate in 40s to 50s at home -She is maintaining sinus rhythm at rate of 40 in clinic -Reduce amiodarone to 200 mg daily -Given LV dysfunction I have recommended to continue Toprol-XL 50 mg daily.  If ongoing bradycardia and symptomatic dizziness, advised to reduce Toprol-XL to 25 to see response and give [place] a call.  If persistent issue at follow-up may change to Coreg. -Not on anticoagulation as it is felt due to postop arrhythmia  4.  Hypertension -Blood pressure stable -Medication as summarized above  5. AKI - Check renal function   Medication Adjustments/Labs and Tests Ordered: Current medicines are reviewed at length with the patient today.  Concerns regarding medicines are outlined above.  Orders Placed This Encounter  Procedures  . Basic metabolic panel  . EKG 12-Lead   Meds ordered this encounter  Medications  . amiodarone (PACERONE) 200 MG tablet    Sig: Take 1 tablet (200 mg total) by mouth daily.    Dispense:  90 tablet    Refill:  3    Patient Instructions  Medication Instructions:  Your physician has recommended you make the following change in your medication:  1.  REDUCE the Amiodarone to 1 tablet daily   Based upon your lab results, you may start Entresto 24/26 mg  twice a day.  WE WILL ADVISE ONCE WE GET YOUR LABS BACK.  YOU WILL BE GIVEN 2 WEEKS OF SAMPLES.. DO NOT START UNTIL YOU ARE INSTRUCTED TO DO SO.    *If you need a refill on your cardiac medications before your next appointment, please call your pharmacy*   Lab Work: None ordered  If you have labs (blood work) drawn today and your tests are completely normal, you will receive your results only by: Marland Kitchen MyChart Message (if you have MyChart) OR . A paper copy in the mail If you have any lab test that is abnormal or we need to change your treatment, we will call you to review the results.   Testing/Procedures: None ordered   Follow-Up: At [facility], you and your health needs are our priority.  As part of our continuing mission to provide you with exceptional heart care, we have created designated Provider Care Teams.  These Care Teams include your primary Cardiologist (physician) and Advanced Practice Providers (APPs -  Physician Assistants and Nurse Practitioners) who all work together to provide you with the care you need, when you need it.  We recommend signing up for the patient portal called "MyChart".  Sign up information is provided on this After Visit Summary.  MyChart is used to connect with patients for Virtual Visits (Telemedicine).  Patients are able to view lab/test results, encounter notes, upcoming appointments, etc.  Non-urgent messages can be sent to your provider as well.   To learn more about what you can do with MyChart, go to [email or web address].    Your next appointment:   2 WEEKS 07/14/2020 ARRIVE AT 11:00  The format for your next appointment:   In Person  Provider:   You may see Bryan Lemma, MD or one of the following Advanced Practice Providers on your designated Care Team:    Azalee Course, [place]    Other Instructions  Lorelei Pont, [place]  07/01/2020 11:54 AM    [facility]

## 2020-07-02 ENCOUNTER — Other Ambulatory Visit: Payer: Self-pay | Admitting: Cardiothoracic Surgery

## 2020-07-02 ENCOUNTER — Telehealth: Payer: Self-pay | Admitting: *Deleted

## 2020-07-02 DIAGNOSIS — Z951 Presence of aortocoronary bypass graft: Secondary | ICD-10-CM

## 2020-07-02 LAB — BASIC METABOLIC PANEL
BUN/Creatinine Ratio: 17 (ref 12–28)
BUN: 30 mg/dL — ABNORMAL HIGH (ref 8–27)
CO2: 23 mmol/L (ref 20–29)
Calcium: 9.8 mg/dL (ref 8.7–10.3)
Chloride: 94 mmol/L — ABNORMAL LOW (ref 96–106)
Creatinine, Ser: 1.8 mg/dL — ABNORMAL HIGH (ref 0.57–1.00)
GFR calc Af Amer: 32 mL/min/{1.73_m2} — ABNORMAL LOW (ref >59)
GFR calc non Af Amer: 28 mL/min/{1.73_m2} — ABNORMAL LOW (ref >59)
Glucose: 159 mg/dL — ABNORMAL HIGH (ref 65–99)
Potassium: 5 mmol/L (ref 3.5–5.2)
Sodium: 137 mmol/L (ref 134–144)

## 2020-07-02 MED ORDER — SPIRONOLACTONE 25 MG PO TABS: 25.0000 mg | ORAL_TABLET | Freq: Two times a day (BID) | ORAL | 3 refills | Status: DC

## 2020-07-02 NOTE — Telephone Encounter (Signed)
-----   Message from [place], [place] sent at 07/02/2020 10:28 AM EST ----- Renal function elevated. K normal at upper limit. Do not start Entresto. Reduce spironolactone to 50mg  daily. Follow up in clinic as schedule.

## 2020-07-05 ENCOUNTER — Encounter: Payer: Self-pay | Admitting: Cardiothoracic Surgery

## 2020-07-05 ENCOUNTER — Ambulatory Visit
Admission: RE | Admit: 2020-07-05 | Discharge: 2020-07-05 | Disposition: A | Discharge status: Home / Self Care | Payer: Medicare HMO | Source: Ambulatory Visit | Attending: Cardiothoracic Surgery | Admitting: Cardiothoracic Surgery

## 2020-07-05 ENCOUNTER — Other Ambulatory Visit: Payer: Self-pay

## 2020-07-05 ENCOUNTER — Ambulatory Visit (INDEPENDENT_AMBULATORY_CARE_PROVIDER_SITE_OTHER): Payer: Self-pay | Admitting: Cardiothoracic Surgery

## 2020-07-05 VITALS — BP 113/64 | HR 54 | Resp 18 | Ht 65.0 in | Wt 216.0 lb

## 2020-07-05 DIAGNOSIS — Z951 Presence of aortocoronary bypass graft: Secondary | ICD-10-CM

## 2020-07-05 NOTE — Telephone Encounter (Signed)
Second attempt to reach pt regarding proof of income. No answer. Left voicemail.

## 2020-07-06 NOTE — Telephone Encounter (Signed)
Pt returned call - she will have her sister fax over most recent federal tax statement as she does not have social security statement readily available.

## 2020-07-08 ENCOUNTER — Telehealth (HOSPITAL_COMMUNITY): Payer: Self-pay

## 2020-07-08 NOTE — Telephone Encounter (Signed)
Cardiac rehab referral for Ph.II faxed to [facility]. 

## 2020-07-14 ENCOUNTER — Other Ambulatory Visit: Payer: Self-pay

## 2020-07-14 ENCOUNTER — Encounter: Payer: Self-pay | Admitting: Physician Assistant

## 2020-07-14 ENCOUNTER — Ambulatory Visit: Payer: Medicare HMO | Admitting: Physician Assistant

## 2020-07-14 VITALS — BP 130/80 | HR 57 | Ht 65.0 in | Wt 216.0 lb

## 2020-07-14 DIAGNOSIS — E785 Hyperlipidemia, unspecified: Secondary | ICD-10-CM

## 2020-07-14 DIAGNOSIS — E039 Hypothyroidism, unspecified: Secondary | ICD-10-CM

## 2020-07-14 DIAGNOSIS — I25810 Atherosclerosis of coronary artery bypass graft(s) without angina pectoris: Secondary | ICD-10-CM

## 2020-07-14 DIAGNOSIS — I10 Essential (primary) hypertension: Secondary | ICD-10-CM | POA: Diagnosis not present

## 2020-07-14 DIAGNOSIS — Z79899 Other long term (current) drug therapy: Secondary | ICD-10-CM

## 2020-07-14 DIAGNOSIS — I480 Paroxysmal atrial fibrillation: Secondary | ICD-10-CM

## 2020-07-14 DIAGNOSIS — E119 Type 2 diabetes mellitus without complications: Secondary | ICD-10-CM

## 2020-07-14 DIAGNOSIS — I255 Ischemic cardiomyopathy: Secondary | ICD-10-CM

## 2020-07-14 DIAGNOSIS — N179 Acute kidney failure, unspecified: Secondary | ICD-10-CM

## 2020-07-14 NOTE — Progress Notes (Signed)
Cardiology Office Note:    Date:  07/16/2020   ID:  Angela Frazier, DOB 07/03/1946, MRN [id number]  PCP:  Zoila Shutter, MD  [facility] Cardiologist:  Bryan Lemma, MD  [facility] Electrophysiologist:  None   Referring MD: Zoila Shutter, MD   Chief Complaint  Patient presents with  . Follow-up    Recent CABG    History of Present Illness:    Angela Frazier is a 74 y.o. 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 Patient was recently admitted on 06/15/2020 due to NSTEMI. Cardiac catheterization revealed multivessel CAD. Echocardiogram showed EF 35 to 40% with grade 2 DD. He eventually underwent CABG x4 with LIMA to distal LAD, SVG to PDA, SVG to OM 2 branch of left circumflex, right radial graft to right PLA. Postop course was complicated by atrial fibrillation and was treated with amiodarone. Due to NSTEMI, she was started on Plavix postoperatively as well. Patient was seen for follow-up on 07/01/2020, she was bradycardic in the high 40s at the time. Amiodarone was reduced to 200 mg daily. Spironolactone was reduced and Entresto was added to her medical regimen.  Patient presents to cardiology service for follow-up.  There has been a lot of medication changes.  Lab work obtained after the last visit showed creatinine jumped to 1.8.  Initiation of Entresto was canceled.  Spironolactone was reduced to 25 mg twice daily.  Since then, patient has been seen by Dr. Vickey Sages who discontinued the isosorbide dinitrate potassium supplement, Lasix and amiodarone.  She presents today for follow-up.  Heart rate is regular.  I am fine with permanently discontinue amiodarone therapy.  As far as her the Lasix, I recommend change the Lasix to as needed only.  Due to recent AKI and borderline elevated potassium level, I will further decrease spironolactone to 25 mg daily.  We will need to check  with the surgeon's office to see if the patient need to restart isosorbide dinitrate given radial artery graft which may have spasm issue without nitrate.  After that, we can consider either restart her losartan or add Entresto.  I will bring the patient back in 2 to 3 weeks for further assessment.  Otherwise she appears to be euvolemic on exam.  She denies any chest pain or significant shortness of breath.  She does have 1 spot near the lower border of her sternotomy scar that is noted for well-healed.  She will be seen Dr. Levada Schilling back next week.  Past Medical History:  Diagnosis Date  . Arthritis   . Asthma   . Diabetes mellitus without complication (HCC)   . Hypercholesteremia   . Hypertension   . Hypothyroidism     Past Surgical History:  Procedure Laterality Date  . CORONARY ARTERY BYPASS GRAFT N/A 06/18/2020   Procedure: CORONARY ARTERY BYPASS GRAFTING (CABG), ON PUMP, TIMES FOUR, USING LEFT INTERNAL MAMMARY ARTERY, ENDOSCOPICALLY HARVESTED RIGHT GREATER SAPHENOUS VEIN, AND LEFT RADIAL ARTERY (OPEN HARVEST);  Surgeon: Linden Dolin, MD;  Location: [facility] OR;  Service: Open Heart Surgery;  Laterality: N/A;  . LEFT HEART CATH AND CORONARY ANGIOGRAPHY N/A 06/16/2020   Procedure: LEFT HEART CATH AND CORONARY ANGIOGRAPHY;  Surgeon: Corky Crafts, MD;  Location: [facility] INVASIVE CV LAB;  Service: Cardiovascular;  Laterality: N/A;  . RADIAL ARTERY HARVEST Left 06/18/2020   Procedure: RADIAL ARTERY HARVEST;  Surgeon: Linden Dolin, MD;  Location: [facility] OR;  Service: Open Heart Surgery;  Laterality: Left;  . TEE WITHOUT CARDIOVERSION N/A 06/18/2020   Procedure: TRANSESOPHAGEAL ECHOCARDIOGRAM (TEE);  Surgeon: Linden DolinAtkins, Broadus Z, MD;  [facility];  Service: Open Heart Surgery;  Laterality: N/A;  . THYROID SURGERY    . TUBAL LIGATION      Current Medications: Current Meds  Medication Sig  . acetaminophen (TYLENOL) 500 MG tablet Take 500 mg by mouth every 6 (six) hours as needed for mild  pain, fever or headache.  . albuterol (PROVENTIL HFA;VENTOLIN HFA) 108 (90 Base) MCG/ACT inhaler Inhale 2 puffs into the lungs every 6 (six) hours as needed for wheezing or shortness of breath.  Marland Kitchen. aspirin 81 MG tablet Take 81 mg by mouth daily.  Marland Kitchen. atorvastatin (LIPITOR) 80 MG tablet Take 1 tablet (80 mg total) by mouth daily.  . Cholecalciferol (VITAMIN D) 50 MCG (2000 UT) CAPS Take 2,000 Units by mouth daily.  . clopidogrel (PLAVIX) 75 MG tablet Take 1 tablet (75 mg total) by mouth daily.  . Cyanocobalamin (B-12 PO) Take 1 tablet by mouth daily.  . dapagliflozin propanediol (FARXIGA) 10 MG TABS tablet Take 1 tablet (10 mg total) by mouth daily.  Marland Kitchen. escitalopram (LEXAPRO) 20 MG tablet Take 20 mg by mouth daily.  . fluticasone (FLONASE) 50 MCG/ACT nasal spray Place 1 spray into both nostrils daily.  . furosemide (LASIX) 40 MG tablet Take 40 mg by mouth as needed.  . gabapentin (NEURONTIN) 100 MG capsule Take 300 mg by mouth at bedtime.  Marland Kitchen. levothyroxine (SYNTHROID) 137 MCG tablet Take 137 mcg by mouth daily.  . metFORMIN (GLUCOPHAGE-XR) 500 MG 24 hr tablet Take 1,000 mg by mouth 2 (two) times daily.  . metoprolol succinate (TOPROL XL) 50 MG 24 hr tablet Take 1 tablet (50 mg total) by mouth daily.  . montelukast (SINGULAIR) 10 MG tablet Take 10 mg by mouth at bedtime.  Marland Kitchen. NOVOLOG MIX 70/30 FLEXPEN (70-30) 100 UNIT/ML FlexPen Inject 0-70 Units into the skin 2 (two) times daily.  . polyvinyl alcohol (LIQUIFILM TEARS) 1.4 % ophthalmic solution Place 1 drop into both eyes as needed for dry eyes.  Marland Kitchen. spironolactone (ALDACTONE) 25 MG tablet Take 25 mg by mouth daily.  . SYMBICORT 80-4.5 MCG/ACT inhaler Inhale 2 puffs into the lungs daily.  . traMADol (ULTRAM) 50 MG tablet Take 1 tablet (50 mg total) by mouth every 8 (eight) hours as needed.     Allergies:   Cefaclor, Codeine, Penicillins, Sulfa antibiotics, Trazodone and nefazodone, Vibramycin [doxycycline calcium], and Erythromycin   Social History    Socioeconomic History  . Marital status: Widowed    Spouse name: Not on file  . Number of children: Not on file  . Years of education: Not on file  . Highest education level: Not on file  Occupational History  . Not on file  Tobacco Use  . Smoking status: Never Smoker  . Smokeless tobacco: Never Used  Vaping Use  . Vaping Use: Never used  Substance and Sexual Activity  . Alcohol use: No  . Drug use: Never  . Sexual activity: Not on file  Other Topics Concern  . Not on file  Social History Narrative  . Not on file   Social Determinants of Health   Financial Resource Strain: Not on file  Food Insecurity: Not on file  Transportation Needs: Not on file  Physical Activity: Not on file  Stress: Not on file  Social Connections: Not on file     Family History: The  patient's family history is not on file.  ROS:   Please see the history of present illness.     All other systems reviewed and are negative.  EKGs/Labs/Other Studies Reviewed:    The following studies were reviewed today:  Echo 06/16/2020 1. Left ventricular ejection fraction, by estimation, is 35 to 40%. The  left ventricle has moderately decreased function. The left ventricle  demonstrates regional wall motion abnormalities (see scoring  diagram/findings for description). There is mild  concentric left ventricular hypertrophy. Left ventricular diastolic  parameters are consistent with Grade II diastolic dysfunction  (pseudonormalization). Elevated left ventricular end-diastolic pressure.  2. Right ventricular systolic function is normal. The right ventricular  size is normal. There is mildly elevated pulmonary artery systolic  pressure.  3. Left atrial size was mildly dilated.  4. Right atrial size was mildly dilated.  5. The mitral valve is normal in structure. Trivial mitral valve  regurgitation. No evidence of mitral stenosis.  6. The aortic valve is grossly normal. There is mild calcification  of the  aortic valve. Aortic valve regurgitation is not visualized. No aortic  stenosis is present.  7. The inferior vena cava is dilated in size with <50% respiratory  variability, suggesting right atrial pressure of 15 mmHg.   Conclusion(s)/Recommendation(s): Reduced LVEF, focal wall motion  abnormalities as noted. No LV thrombus seen with contrast.    Cath 06/16/2020  Dist RCA lesion is 100% stenosed. Left to right collaterals.  Mid RCA lesion is 50% stenosed.  Prox Cx lesion is 80% stenosed.  Mid Cx lesion is 80% stenosed.  1st Mrg lesion is 75% stenosed. This is a small vessel and may not be a target for CABG. The OM2 is a large vessel that is likely a better bypass target.  Ost LAD to Prox LAD lesion is 90% stenosed.  There is moderate left ventricular systolic dysfunction.  LV end diastolic pressure is moderately elevated.  The left ventricular ejection fraction is 35-45% by visual estimate.  There is no aortic valve stenosis.  RPDA lesion is 80% stenosed.  RPAV lesion is 80% stenosed.   Severe three vessel disease.  Will obtain CVTS consult for CABG.  Medical therapy for LV dysfunction.   Likely will need grafts to LAD, OM2, PDA, PLA     EKG:  EKG is not ordered today.   Recent Labs: 06/16/2020: ALT 16 06/19/2020: Magnesium 2.2 06/21/2020: Hemoglobin 10.3; Platelets 183 07/14/2020: BUN 21; Creatinine, Ser 1.14; Potassium 4.5; Sodium 143  Recent Lipid Panel    Component Value Date/Time   CHOL 139 06/16/2020 0513   TRIG 116 06/16/2020 0513   HDL 34 (L) 06/16/2020 0513   CHOLHDL 4.1 06/16/2020 0513   VLDL 23 06/16/2020 0513   LDLCALC 82 06/16/2020 0513     Risk Assessment/Calculations:     CHA2DS2-VASc Score = 5  This indicates a 7.2% annual risk of stroke. The patient's score is based upon: CHF History: No HTN History: Yes Diabetes History: Yes Stroke History: No Vascular Disease History: Yes Age Score: 1 Gender Score: 1       Physical Exam:    VS:  BP 130/80   Pulse (!) 57   Ht 5\' 5"  (1.651 m)   Wt 216 lb (98 kg)   SpO2 97%   BMI 35.94 kg/m     Wt Readings from Last 3 Encounters:  07/14/20 216 lb (98 kg)  07/05/20 216 lb (98 kg)  07/01/20 225 lb 0.6 oz (102.1 kg)  GEN:  Well nourished, well developed in no acute distress HEENT: Normal NECK: No JVD; No carotid bruits LYMPHATICS: No lymphadenopathy CARDIAC: RRR, no murmurs, rubs, gallops RESPIRATORY:  Clear to auscultation without rales, wheezing or rhonchi  ABDOMEN: Soft, non-tender, non-distended MUSCULOSKELETAL:  No edema; No deformity  SKIN: Warm and dry NEUROLOGIC:  Alert and oriented x 3 PSYCHIATRIC:  Normal affect   ASSESSMENT:    1. Coronary artery disease involving coronary bypass graft of native heart without angina pectoris   2. Medication management   3. Essential hypertension   4. Hyperlipidemia LDL goal <70   5. Controlled type 2 diabetes mellitus without complication, without long-term current use of insulin (HCC)   6. Acquired hypothyroidism   7. PAF (paroxysmal atrial fibrillation) (HCC)   8. AKI (acute kidney injury) (HCC)   9. Ischemic cardiomyopathy    PLAN:    In order of problems listed above:  1. CAD s/p CABG: Recovering well after recent bypass surgery.  Continue aspirin and Lipitor  2. Hypertension: Blood pressure stable.  Lasix was recently discontinued due to AKI.  Currently on twice a day of spironolactone, will reduce this further down to once daily.  Obtain basic metabolic panel.  According to the patient, her isosorbide dinitrate was also held by CT surgery.  Isosorbide dinitrate was prescribed because of radial artery graft to prevent spasm.  Her blood pressure will continue to improve, I will defer to CT surgery to see if they want to restart isosorbide dinitrate.  3. Hyperlipidemia: Continue Lipitor  4. DM2: Managed by primary care provider  5. Hypothyroidism: On levothyroxine  6. Postop  atrial fibrillation: Per patient, amiodarone was recently discontinued by Dr. Vickey Sages.  CT surgery's office note is currently not complete for me to confirm this.  7. AKI: Creatinine trended up to 1.8.  Lasix was held.  Will obtain basic metabolic panel.  Likely will attempt to add either losartan or Entresto in the future  8. Ischemic cardiomyopathy: Plan to add losartan or Entresto in the future if renal function is stable.        Medication Adjustments/Labs and Tests Ordered: Current medicines are reviewed at length with the patient today.  Concerns regarding medicines are outlined above.  Orders Placed This Encounter  Procedures  . Basic metabolic panel   No orders of the defined types were placed in this encounter.   Patient Instructions  Medication Instructions:   Take Lasix as needed   DECREASE Spironolactone to 25 mg daily  *If you need a refill on your cardiac medications before your next appointment, please call your pharmacy*  Lab Work: Your physician recommends that you return for lab work TODAY:  BMET  If you have labs (blood work) drawn today and your tests are completely normal, you will receive your results only by: Marland Kitchen MyChart Message (if you have MyChart) OR . A paper copy in the mail If you have any lab test that is abnormal or we need to change your treatment, we will call you to review the results.  Testing/Procedures: NONE ordered at this time of appointment   Follow-Up: At [facility], you and your health needs are our priority.  As part of our continuing mission to provide you with exceptional heart care, we have created designated Provider Care Teams.  These Care Teams include your primary Cardiologist (physician) and Advanced Practice Providers (APPs -  Physician Assistants and Nurse Practitioners) who all work together to provide you with the care you need,  when you need it.  We recommend signing up for the patient portal called "MyChart".  Sign  up information is provided on this After Visit Summary.  MyChart is used to connect with patients for Virtual Visits (Telemedicine).  Patients are able to view lab/test results, encounter notes, upcoming appointments, etc.  Non-urgent messages can be sent to your provider as well.   To learn more about what you can do with MyChart, go to [email or web address].    Your next appointment:   2-3 week(s) 2-3 month(s)   The format for your next appointment:   In Person In Person  Provider:   Azalee Course, PA-C Bryan Lemma, MD  Other Instructions      Signed, Azalee Course, PA  07/16/2020 10:32 PM    [facility]

## 2020-07-14 NOTE — Patient Instructions (Addendum)
Medication Instructions:   Take Lasix as needed   DECREASE Spironolactone to 25 mg daily  *If you need a refill on your cardiac medications before your next appointment, please call your pharmacy*  Lab Work: Your physician recommends that you return for lab work TODAY:  BMET  If you have labs (blood work) drawn today and your tests are completely normal, you will receive your results only by: Marland Kitchen MyChart Message (if you have MyChart) OR . A paper copy in the mail If you have any lab test that is abnormal or we need to change your treatment, we will call you to review the results.  Testing/Procedures: NONE ordered at this time of appointment   Follow-Up: At [facility], you and your health needs are our priority.  As part of our continuing mission to provide you with exceptional heart care, we have created designated Provider Care Teams.  These Care Teams include your primary Cardiologist (physician) and Advanced Practice Providers (APPs -  Physician Assistants and Nurse Practitioners) who all work together to provide you with the care you need, when you need it.  We recommend signing up for the patient portal called "MyChart".  Sign up information is provided on this After Visit Summary.  MyChart is used to connect with patients for Virtual Visits (Telemedicine).  Patients are able to view lab/test results, encounter notes, upcoming appointments, etc.  Non-urgent messages can be sent to your provider as well.   To learn more about what you can do with MyChart, go to [email or web address].    Your next appointment:   2-3 week(s) 2-3 month(s)   The format for your next appointment:   In Person In Person  Provider:   Azalee Course, PA-C Bryan Lemma, MD  Other Instructions

## 2020-07-15 LAB — BASIC METABOLIC PANEL
BUN/Creatinine Ratio: 18 (ref 12–28)
BUN: 21 mg/dL (ref 8–27)
CO2: 22 mmol/L (ref 20–29)
Calcium: 9.4 mg/dL (ref 8.7–10.3)
Chloride: 104 mmol/L (ref 96–106)
Creatinine, Ser: 1.14 mg/dL — ABNORMAL HIGH (ref 0.57–1.00)
GFR calc Af Amer: 55 mL/min/{1.73_m2} — ABNORMAL LOW (ref >59)
GFR calc non Af Amer: 48 mL/min/{1.73_m2} — ABNORMAL LOW (ref >59)
Glucose: 132 mg/dL — ABNORMAL HIGH (ref 65–99)
Potassium: 4.5 mmol/L (ref 3.5–5.2)
Sodium: 143 mmol/L (ref 134–144)

## 2020-07-16 ENCOUNTER — Encounter: Payer: Self-pay | Admitting: Physician Assistant

## 2020-07-19 ENCOUNTER — Ambulatory Visit: Payer: Self-pay | Admitting: Cardiothoracic Surgery

## 2020-07-20 NOTE — Telephone Encounter (Signed)
Income statement received. Faxed in to [employer].   Will continue to follow.

## 2020-07-21 ENCOUNTER — Other Ambulatory Visit: Payer: Self-pay

## 2020-07-21 ENCOUNTER — Ambulatory Visit (INDEPENDENT_AMBULATORY_CARE_PROVIDER_SITE_OTHER): Payer: Self-pay | Admitting: Physician Assistant

## 2020-07-21 ENCOUNTER — Telehealth (HOSPITAL_COMMUNITY): Payer: Self-pay

## 2020-07-21 VITALS — BP 151/58 | HR 64 | Temp 97.7°F | Ht 65.0 in

## 2020-07-21 DIAGNOSIS — Z951 Presence of aortocoronary bypass graft: Secondary | ICD-10-CM

## 2020-07-21 DIAGNOSIS — Z5189 Encounter for other specified aftercare: Secondary | ICD-10-CM

## 2020-07-21 NOTE — Telephone Encounter (Signed)
Patient assistance application for Angela Frazier has been approved from 07/20/20 through 07/23/21.  Note: Patient has been off Entresto due to decline in renal function. Last BMET SCr improved. May reattempt Entresto in the future. If this occurs, she will receive Entresto for free from [employer].   Sharen Hones, PharmD, BCPS Heart Failure Stewardship Pharmacist Phone [phone]

## 2020-07-21 NOTE — Progress Notes (Signed)
[address]       [place] [address]             [phone]      Angela Frazier is a 74 y.o. female patient who underwent a coronary bypass grafting x4 with Dr. Renaldo Fiddler about a month ago.  She did return for follow-up appointment with him on December 13.  She presents today for a incisional check.  She had some eschar flake off of the lower portion of her sternotomy incision and underneath was some whitish-yellow sloughing.   1. S/P CABG x 4   2. Visit for wound check    Past Medical History:  Diagnosis Date  . Arthritis   . Asthma   . Diabetes mellitus without complication (HCC)   . Hypercholesteremia   . Hypertension   . Hypothyroidism    No past surgical history pertinent negatives on file. Scheduled Meds: Current Outpatient Medications on File Prior to Visit  Medication Sig Dispense Refill  . acetaminophen (TYLENOL) 500 MG tablet Take 500 mg by mouth every 6 (six) hours as needed for mild pain, fever or headache.    . albuterol (PROVENTIL HFA;VENTOLIN HFA) 108 (90 Base) MCG/ACT inhaler Inhale 2 puffs into the lungs every 6 (six) hours as needed for wheezing or shortness of breath.    Marland Kitchen aspirin 81 MG tablet Take 81 mg by mouth daily.    Marland Kitchen atorvastatin (LIPITOR) 80 MG tablet Take 1 tablet (80 mg total) by mouth daily. 30 tablet 1  . Cholecalciferol (VITAMIN D) 50 MCG (2000 UT) CAPS Take 2,000 Units by mouth daily.    . clopidogrel (PLAVIX) 75 MG tablet Take 1 tablet (75 mg total) by mouth daily. 30 tablet 1  . Cyanocobalamin (B-12 PO) Take 1 tablet by mouth daily.    . dapagliflozin propanediol (FARXIGA) 10 MG TABS tablet Take 1 tablet (10 mg total) by mouth daily. 30 tablet 1  . escitalopram (LEXAPRO) 20 MG tablet Take 20 mg by mouth daily.    . fluticasone (FLONASE) 50 MCG/ACT nasal spray Place 1 spray into both nostrils daily.    . furosemide (LASIX) 40 MG tablet Take 40 mg by mouth as needed.    . gabapentin (NEURONTIN) 100 MG capsule Take 300 mg by  mouth at bedtime.    Marland Kitchen levothyroxine (SYNTHROID) 137 MCG tablet Take 137 mcg by mouth daily.    . magnesium oxide (MAG-OX) 400 MG tablet Take 400 mg by mouth daily.    . metFORMIN (GLUCOPHAGE-XR) 500 MG 24 hr tablet Take 1,000 mg by mouth 2 (two) times daily.    . metoprolol succinate (TOPROL XL) 50 MG 24 hr tablet Take 1 tablet (50 mg total) by mouth daily. 90 tablet 3  . montelukast (SINGULAIR) 10 MG tablet Take 10 mg by mouth at bedtime.    Marland Kitchen NOVOLOG MIX 70/30 FLEXPEN (70-30) 100 UNIT/ML FlexPen Inject 0-70 Units into the skin 2 (two) times daily.    . polyvinyl alcohol (LIQUIFILM TEARS) 1.4 % ophthalmic solution Place 1 drop into both eyes as needed for dry eyes.    Marland Kitchen spironolactone (ALDACTONE) 25 MG tablet Take 25 mg by mouth daily.    . SYMBICORT 80-4.5 MCG/ACT inhaler Inhale 2 puffs into the lungs daily.    . traMADol (ULTRAM) 50 MG tablet Take 1 tablet (50 mg total) by mouth every 8 (eight) hours as needed. 30 tablet 0   No current facility-administered medications on file prior to visit.  :  Allergies  Allergen Reactions  . Cefaclor   . Codeine   . Penicillins   . Sulfa Antibiotics   . Trazodone And Nefazodone   . Vibramycin [Doxycycline Calcium]   . Erythromycin Rash   Active Problems:   * No active hospital problems. *  Blood pressure (!) 151/58, pulse 64, temperature 97.7 F (36.5 C), height 5\' 5"  (1.651 m), SpO2 91 %.  Subjective  Patient presents today for a sternal incision wound check.   Objective  Cor: RRR, no murmur Pulm: CTA bilaterally Abd: no tenderness Wound: Lower portion of her sternotomy incision has some white/yellowish slimy sloughing.  A Q-tip was used to clean the area and it did reveal a small pinhole at the base of her incision.  The area was cleaned well with Betadine.  Assessment & Plan   Angela Frazier is a 74 year old female patient with a past medical history significant for type 2 diabetes melitis complicated by neuropathy, hyperlipidemia,  OSA, hypertension, hypothyroidism, and recent coronary bypass grafting.  Today, she has no other complaints other than a portion of her sternotomy incision is not healing well.  She has had no fevers or chills.  She has had no shortness of breath or chest pain.  She also notices the edema in her lower extremity has been improving significantly since her hospitalization.  She was in the shower every other day and some eschar from the lower portion of her incision peeled away and revealed some tissue that was not healing well.  Tissue is slippery in nature and a whitish-yellowish color.  The area was cleaned well with Betadine swab and 4 x 4 gauze.  On examination with a Q-tip a pin-sized hole was revealed at the bottom portion of the incision.  I packed the hole with iodoform strips and cover the incision with 2 x 2 gauze and tape.  Patient felt comfortable doing this at home once a day after showering.  She was instructed to remove the packing shower, and then reinsert about an inch and a half of iodoform packing into the area and cover with 2 x 2 gauze and tape.  She is to do this once a day.  I was going to start her on a prophylactic antibiotic, even though there is no infection at this time and she has no signs of systemic infection.  However, the patient has multiple antibiotic allergies and also would require a Diflucan prescription for a yeast infection but she always gets with antibiotic administration.  Given her allergies and that there is no current ongoing infection, we have decided to hold off on antibiotics for now.  The patient is instructed to call our office if she develops any drainage, a foul-smelling odor coming from the incision, or increased erythema around the incision.  She will have close follow-up next week with one of the PAs to evaluate her incision.  She has any fevers or chills she is to call our office immediately.  Instructions include: Follow-up next week with one of the PAs for  another wound check.  At this time we may or may not need to start antibiotics based on what the wound looks like and how it is healing. Will consider Clindamycin if needed.   66 07/21/2020

## 2020-07-26 ENCOUNTER — Ambulatory Visit: Payer: Self-pay

## 2020-07-26 NOTE — Patient Instructions (Signed)
Return next week.

## 2020-07-27 ENCOUNTER — Other Ambulatory Visit: Payer: Self-pay

## 2020-07-27 ENCOUNTER — Ambulatory Visit (INDEPENDENT_AMBULATORY_CARE_PROVIDER_SITE_OTHER): Payer: Self-pay | Admitting: Surgical

## 2020-07-27 VITALS — BP 161/73 | HR 61 | Resp 20 | Ht 65.0 in

## 2020-07-27 DIAGNOSIS — Z951 Presence of aortocoronary bypass graft: Secondary | ICD-10-CM

## 2020-07-27 DIAGNOSIS — Z5189 Encounter for other specified aftercare: Secondary | ICD-10-CM

## 2020-07-27 DIAGNOSIS — T8130XA Disruption of wound, unspecified, initial encounter: Secondary | ICD-10-CM | POA: Insufficient documentation

## 2020-07-27 MED ORDER — EMPAGLIFLOZIN 10 MG PO TABS: 10.0000 mg | ORAL_TABLET | Freq: Every day | ORAL | 1 refills | Status: DC

## 2020-07-27 NOTE — Progress Notes (Signed)
[address]       [place] [address]             [phone]      [facility] Medical Record #[id number] Date of Birth: 1946/07/22  Referring: Corky Crafts, MD Primary Care: Zoila Shutter, MD Primary Cardiologist: Bryan Lemma, MD   Chief Complaint:   POST OP FOLLOW UP CARDIOTHORACIC SURGERY OPERATIVE NOTE  Date of Procedure:    06/18/2020  Preoperative Diagnosis:      Severe 3-vessel Coronary Artery Disease, s/p NSTEMI  Postoperative Diagnosis:    Same  Procedure:        Coronary Artery Bypass Grafting x 4              Left Internal Mammary Artery to Distal Left Anterior Descending Coronary Artery; Saphenous Vein Graft to Posterior Descending Coronary Artery; Saphenous Vein Graft to 2nd Obtuse Marginal Branch of Left Circumflex Coronary Artery; left radial artery graft to right Posterolateral Coronary Artery; Endoscopic Vein Harvest from right thigh  Open left radial artery harvesting Completion graft surveillance with indocyanine green fluorescence imaging (SPY)  Surgeon:        B. Micheline Maze, MD  Assistant:       Gaynelle Arabian, PA-C  Anesthesia:    get  Operative Findings: ? Mildly reduced left ventricular systolic function ? good quality left internal mammary artery conduit ? good quality saphenous vein conduit and radial artery conduit ? good quality target vessels for grafting  History of Present Illness:    The patient is seen again to reevaluate her sternal incision for small area of nonhealing.  She has been packing it daily with iodoform.  She has not noticed any significant purulence and the erythema has improved.  She is not having any fevers, chills or other significant constitutional symptoms.  She states her blood sugars have been under better control.      Past Medical History:  Diagnosis Date  . Arthritis   . Asthma   . Diabetes mellitus without complication (HCC)   . Hypercholesteremia    . Hypertension   . Hypothyroidism      Social History   Tobacco Use  Smoking Status Never Smoker  Smokeless Tobacco Never Used    Social History   Substance and Sexual Activity  Alcohol Use No     Allergies  Allergen Reactions  . Cefaclor   . Codeine   . Penicillins   . Sulfa Antibiotics   . Trazodone And Nefazodone   . Vibramycin [Doxycycline Calcium]   . Erythromycin Rash    Current Outpatient Medications  Medication Sig Dispense Refill  . acetaminophen (TYLENOL) 500 MG tablet Take 500 mg by mouth every 6 (six) hours as needed for mild pain, fever or headache.    . albuterol (PROVENTIL HFA;VENTOLIN HFA) 108 (90 Base) MCG/ACT inhaler Inhale 2 puffs into the lungs every 6 (six) hours as needed for wheezing or shortness of breath.    Marland Kitchen aspirin 81 MG tablet Take 81 mg by mouth daily.    Marland Kitchen atorvastatin (LIPITOR) 80 MG tablet Take 1 tablet (80 mg total) by mouth daily. 30 tablet 1  . Cholecalciferol (VITAMIN D) 50 MCG (2000 UT) CAPS Take 2,000 Units by mouth daily.    . clopidogrel (PLAVIX) 75 MG tablet Take 1 tablet (75 mg total) by mouth daily. 30 tablet 1  . Cyanocobalamin (B-12 PO) Take 1 tablet by mouth daily.    Marland Kitchen  dapagliflozin propanediol (FARXIGA) 10 MG TABS tablet Take 1 tablet (10 mg total) by mouth daily. 30 tablet 1  . escitalopram (LEXAPRO) 20 MG tablet Take 20 mg by mouth daily.    . fluticasone (FLONASE) 50 MCG/ACT nasal spray Place 1 spray into both nostrils daily.    . furosemide (LASIX) 40 MG tablet Take 40 mg by mouth as needed.    . gabapentin (NEURONTIN) 100 MG capsule Take 300 mg by mouth at bedtime.    Marland Kitchen levothyroxine (SYNTHROID) 137 MCG tablet Take 137 mcg by mouth daily.    . magnesium oxide (MAG-OX) 400 MG tablet Take 400 mg by mouth daily.    . metFORMIN (GLUCOPHAGE-XR) 500 MG 24 hr tablet Take 1,000 mg by mouth 2 (two) times daily.    . metoprolol succinate (TOPROL XL) 50 MG 24 hr tablet Take 1 tablet (50 mg total) by mouth daily. 90 tablet 3   . montelukast (SINGULAIR) 10 MG tablet Take 10 mg by mouth at bedtime.    Marland Kitchen NOVOLOG MIX 70/30 FLEXPEN (70-30) 100 UNIT/ML FlexPen Inject 0-70 Units into the skin 2 (two) times daily.    . polyvinyl alcohol (LIQUIFILM TEARS) 1.4 % ophthalmic solution Place 1 drop into both eyes as needed for dry eyes.    Marland Kitchen spironolactone (ALDACTONE) 25 MG tablet Take 25 mg by mouth daily.    . SYMBICORT 80-4.5 MCG/ACT inhaler Inhale 2 puffs into the lungs daily.    . traMADol (ULTRAM) 50 MG tablet Take 1 tablet (50 mg total) by mouth every 8 (eight) hours as needed. 30 tablet 0   No current facility-administered medications for this visit.       Physical Exam: BP (!) 161/73 (BP Location: Right Arm, Patient Position: Sitting)   Pulse 61   Resp 20   Ht 5\' 5"  (1.651 m)   SpO2 92% Comment: RA with mask on  BMI 35.94 kg/m   Wound: Wound appears to have slow healing of the portion of the distal sternal incision.  It is approximately 2 cm deep by sterile Q-tip probe.  There may be a small amount of fatty necrosis but it does not appear infected.  There is no evidence of cellulitis.   Diagnostic Studies & Laboratory data:     Recent Radiology Findings:   No results found.    Recent Lab Findings: Lab Results  Component Value Date   WBC 13.7 (H) 06/21/2020   HGB 10.3 (L) 06/21/2020   HCT 31.6 (L) 06/21/2020   PLT 183 06/21/2020   GLUCOSE 132 (H) 07/14/2020   CHOL 139 06/16/2020   TRIG 116 06/16/2020   HDL 34 (L) 06/16/2020   LDLCALC 82 06/16/2020   ALT 16 06/16/2020   AST 32 06/16/2020   NA 143 07/14/2020   K 4.5 07/14/2020   CL 104 07/14/2020   CREATININE 1.14 (H) 07/14/2020   BUN 21 07/14/2020   CO2 22 07/14/2020   INR 1.4 (H) 06/18/2020   HGBA1C 9.1 (H) 06/17/2020      Assessment / Plan: Continue daily packing and we will reevaluate in 2 weeks.  She knows of things to look for in terms of worsening/ infection.      Medication Changes: No orders of the defined types were placed  in this encounter.     06/19/2020, PA-C 07/27/2020 1:54 PM

## 2020-07-27 NOTE — Patient Instructions (Signed)
Continue dressings daily

## 2020-07-29 ENCOUNTER — Ambulatory Visit: Payer: Medicare HMO | Admitting: Physician Assistant

## 2020-07-30 ENCOUNTER — Telehealth (HOSPITAL_COMMUNITY): Payer: Self-pay

## 2020-07-30 NOTE — Telephone Encounter (Signed)
Contacted [employer] to follow up on Farxiga patient assistance application following submission of income verification documentation.  She has been approved into the program (07/24/20-07/23/21) and will receive Farxiga for $0 per fill through the mail (they will send 55-month supplies).  Called pt and left voicemail to inform her of status update.   Note she has been prescribed Jardiance 10 mg daily. Instructed her that she should not be taking both of these and she could begin to take [place] once she receives this in the mail.  Will forward this note to cardiology so they are aware of these updates at next follow up appt later this month.   Sharen Hones, PharmD, BCPS Heart Failure Stewardship Pharmacist Phone [phone]

## 2020-08-02 ENCOUNTER — Ambulatory Visit: Payer: Medicare HMO

## 2020-08-02 NOTE — Progress Notes (Signed)
The patient presents for evaluation status post CABG.  She has no new complaints and has been doing well.  Denies chest pain or shortness of breath  BP 113/64 (BP Location: Right Arm, Patient Position: Sitting)   Pulse (!) 54   Resp 18   Ht 5\' 5"  (1.651 m)   Wt 98 kg   SpO2 95% Comment: RA with mask on  BMI 35.94 kg/m  Well-appearing no acute distress  clear to auscultation bilaterally Regular rate and rhythm Incisions well-healed  Imaging: Clear lung fields  Impression: Doing well after CABG  Plan: Follow-up as needed with thoracic surgery Follow-up with cardiology  Shawnte Demarest Z. , MD [phone]

## 2020-08-03 ENCOUNTER — Telehealth: Payer: Self-pay | Admitting: *Deleted

## 2020-08-03 NOTE — Telephone Encounter (Signed)
Pt's home health physical therapist contacted the office stating pt has concerns whether or not she is packing her wound correctly. PT requesting to get a home health nurse involved in care to ensure proper packing of wound. Verbal order given. No further questions.

## 2020-08-04 ENCOUNTER — Other Ambulatory Visit: Payer: Self-pay | Admitting: Physician Assistant

## 2020-08-09 ENCOUNTER — Ambulatory Visit: Payer: Medicare HMO | Admitting: Cardiothoracic Surgery

## 2020-08-12 ENCOUNTER — Ambulatory Visit: Payer: Self-pay | Admitting: Cardiothoracic Surgery

## 2020-08-12 ENCOUNTER — Ambulatory Visit: Payer: Medicare HMO | Admitting: Physician Assistant

## 2020-08-16 ENCOUNTER — Other Ambulatory Visit: Payer: Self-pay

## 2020-08-16 ENCOUNTER — Ambulatory Visit (INDEPENDENT_AMBULATORY_CARE_PROVIDER_SITE_OTHER): Payer: Self-pay | Admitting: Physician Assistant

## 2020-08-16 ENCOUNTER — Ambulatory Visit: Payer: Medicare HMO | Admitting: Physician Assistant

## 2020-08-16 VITALS — BP 118/58 | HR 60 | Resp 20

## 2020-08-16 DIAGNOSIS — Z951 Presence of aortocoronary bypass graft: Secondary | ICD-10-CM

## 2020-08-16 DIAGNOSIS — Z5189 Encounter for other specified aftercare: Secondary | ICD-10-CM

## 2020-08-16 NOTE — Patient Instructions (Signed)
Follow-up in 1 to 2 weeks for another wound check.

## 2020-08-16 NOTE — Progress Notes (Signed)
[address]       [place] [address]             [phone]       Angela Frazier is a 75 y.o. female patient who presents today for a wound check.  Otherwise she has been doing quite well.  She states her sugars have been well controlled.   1. Visit for wound check   2. S/P CABG x 4    Past Medical History:  Diagnosis Date  . Arthritis   . Asthma   . Diabetes mellitus without complication (HCC)   . Hypercholesteremia   . Hypertension   . Hypothyroidism    No past surgical history pertinent negatives on file. Scheduled Meds: Current Outpatient Medications on File Prior to Visit  Medication Sig Dispense Refill  . acetaminophen (TYLENOL) 500 MG tablet Take 500 mg by mouth every 6 (six) hours as needed for mild pain, fever or headache.    . albuterol (PROVENTIL HFA;VENTOLIN HFA) 108 (90 Base) MCG/ACT inhaler Inhale 2 puffs into the lungs every 6 (six) hours as needed for wheezing or shortness of breath.    Marland Kitchen aspirin 81 MG tablet Take 81 mg by mouth daily.    Marland Kitchen atorvastatin (LIPITOR) 80 MG tablet Take 1 tablet (80 mg total) by mouth daily. 30 tablet 1  . Cholecalciferol (VITAMIN D) 50 MCG (2000 UT) CAPS Take 2,000 Units by mouth daily.    . clopidogrel (PLAVIX) 75 MG tablet Take 1 tablet (75 mg total) by mouth daily. 30 tablet 1  . Cyanocobalamin (B-12 PO) Take 1 tablet by mouth daily.    Marland Kitchen escitalopram (LEXAPRO) 20 MG tablet Take 20 mg by mouth daily.    . fluticasone (FLONASE) 50 MCG/ACT nasal spray Place 1 spray into both nostrils daily.    . furosemide (LASIX) 40 MG tablet Take 1 tablet (40 mg total) by mouth as needed. 30 tablet 0  . gabapentin (NEURONTIN) 100 MG capsule Take 300 mg by mouth at bedtime.    Marland Kitchen levothyroxine (SYNTHROID) 137 MCG tablet Take 137 mcg by mouth daily.    . magnesium oxide (MAG-OX) 400 MG tablet Take 400 mg by mouth daily.    . metFORMIN (GLUCOPHAGE-XR) 500 MG 24 hr tablet Take 1,000 mg by mouth 2 (two) times daily.    .  metoprolol succinate (TOPROL XL) 50 MG 24 hr tablet Take 1 tablet (50 mg total) by mouth daily. 90 tablet 3  . montelukast (SINGULAIR) 10 MG tablet Take 10 mg by mouth at bedtime.    Marland Kitchen NOVOLOG MIX 70/30 FLEXPEN (70-30) 100 UNIT/ML FlexPen Inject 0-70 Units into the skin 2 (two) times daily.    . polyvinyl alcohol (LIQUIFILM TEARS) 1.4 % ophthalmic solution Place 1 drop into both eyes as needed for dry eyes.    Marland Kitchen spironolactone (ALDACTONE) 25 MG tablet Take 25 mg by mouth daily.    . SYMBICORT 80-4.5 MCG/ACT inhaler Inhale 2 puffs into the lungs daily.    . traMADol (ULTRAM) 50 MG tablet Take 1 tablet (50 mg total) by mouth every 8 (eight) hours as needed. 30 tablet 0  . empagliflozin (JARDIANCE) 10 MG TABS tablet Take 1 tablet (10 mg total) by mouth daily before breakfast. 30 tablet 1   No current facility-administered medications on file prior to visit.   Allergies  Allergen Reactions  . Cefaclor   . Codeine   . Penicillins   . Sulfa Antibiotics   .  Trazodone And Nefazodone   . Vibramycin [Doxycycline Calcium]   . Erythromycin Rash   Active Problems:   * No active hospital problems. *  Blood pressure (!) 118/58, pulse 60, resp. rate 20, SpO2 92 %.  Subjective   Angela Frazier is a 75 year old female patient presents today for a wound check.  She has been visiting our office for the last few weeks.   Objective   Cor: Regular rate and rhythm, no murmur Pulm: Clear to auscultation bilaterally and in all fields Abd: No tenderness Wound: There is a pinpoint hole at the bottom of her sternotomy incision.  It does track about 3 cm. Ext: No edema   CLINICAL DATA:  Status post coronary artery bypass grafting  EXAM: CHEST - 2 VIEW  COMPARISON:  June 21, 2020  FINDINGS: There is a small left pleural effusion with left midlung atelectasis. Lungs elsewhere are clear. Heart is mildly enlarged with pulmonary vascularity normal. Status post coronary artery bypass grafting. No  adenopathy. There is aortic atherosclerosis. There is mild degenerative change in the thoracic spine.  IMPRESSION: Small left pleural effusion with mild left midlung atelectasis. Lungs elsewhere clear. Stable cardiac prominence with postoperative changes.  Aortic Atherosclerosis (ICD10-I70.0).   Electronically Signed   By: Bretta Bang III M.D.   On: 07/05/2020 11:24   Assessment & Plan     Angela Frazier has been changing out her iodoform ribbon packing daily after showering.  She occasionally does notice some drainage on the 2 x 2 gauze but never anything copious.  She feels confident with how to change her dressing especially since home health is now involved.  She is to continue packing her incision with iodoform ribbon and dressing it with 2 x 2 gauze with tape.  She does endorse that the hole has become smaller over time.  It does not look like it is infected.  I do not think there are any indication for antibiotics at this time.  If she develops fevers, chills, or other signs or symptoms of infection she is to call the office immediately.  The area is clean, dry, without any erythema.  Plan: Continue daily packing and dressing change after showering.  Plan to follow-up in 1 to 2 weeks with either Dr. Renaldo Fiddler or physician assistant.  All questions were answered to the patient's satisfaction.  Sharlene Dory 08/16/2020

## 2020-08-24 NOTE — Progress Notes (Signed)
Cardiology Office Note   Date:  08/27/2020   ID:  Angela Frazier, DOB 1945-11-12, MRN [id number]  PCP:  Zoila Shutter, MD  Cardiologist: Dr. Herbie Baltimore  CC: Follow Up   History of Present Illness: Angela Frazier is a 75 y.o. 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   Patient was recently admitted on 06/15/2020 due to NSTEMI. Cardiac catheterization revealed multivessel CAD. Echocardiogram showed EF 35 to 40% with grade 2 DD. He eventually underwent CABG x4 with LIMA to distal LAD, SVG to PDA, SVG to OM 2 branch of left circumflex, right radial graft to right PLA. Postop course was complicated by atrial fibrillation and was treated with amiodarone. Due to NSTEMI, she was started on Plavix postoperatively as well.  Patient was seen for follow-up on 07/01/2020, she was bradycardic in the high 40s at the time. Amiodarone was reduced to 200 mg daily. Spironolactone was reduced and Entresto was added to her medical regimen.  Seen last by Azalee Course, PA on 07/14/2020 where he noted several medication changes. Entresto had been discontinued, along with isosorbide dinitrate, potassium supplement, lasix and amiodarone. She was placed on lasix prn. Spironolactone was decreased to 12.5 mg daily.   She comes today with no cardiac complaints.  She denies any dyspnea on exertion, chest discomfort, or fatigue.  She plans on going to cardiac rehab and is looking forward to it.  She states that her blood sugar has been well controlled.  She also states that she has been approved for Sherryll Burger should we need it.  Past Medical History:  Diagnosis Date  . Arthritis   . Asthma   . Diabetes mellitus without complication (HCC)   . Hypercholesteremia   . Hypertension   . Hypothyroidism     Past Surgical History:  Procedure Laterality Date  . CORONARY ARTERY BYPASS GRAFT  N/A 06/18/2020   Procedure: CORONARY ARTERY BYPASS GRAFTING (CABG), ON PUMP, TIMES FOUR, USING LEFT INTERNAL MAMMARY ARTERY, ENDOSCOPICALLY HARVESTED RIGHT GREATER SAPHENOUS VEIN, AND LEFT RADIAL ARTERY (OPEN HARVEST);  Surgeon: Linden Dolin, MD;  Location: [facility] OR;  Service: Open Heart Surgery;  Laterality: N/A;  . LEFT HEART CATH AND CORONARY ANGIOGRAPHY N/A 06/16/2020   Procedure: LEFT HEART CATH AND CORONARY ANGIOGRAPHY;  Surgeon: Corky Crafts, MD;  Location: [facility] INVASIVE CV LAB;  Service: Cardiovascular;  Laterality: N/A;  . RADIAL ARTERY HARVEST Left 06/18/2020   Procedure: RADIAL ARTERY HARVEST;  Surgeon: Linden Dolin, MD;  Location: [facility] OR;  Service: Open Heart Surgery;  Laterality: Left;  . TEE WITHOUT CARDIOVERSION N/A 06/18/2020   Procedure: TRANSESOPHAGEAL ECHOCARDIOGRAM (TEE);  Surgeon: Linden Dolin, MD;  Location: [facility] OR;  Service: Open Heart Surgery;  Laterality: N/A;  . THYROID SURGERY    . TUBAL LIGATION       Current Outpatient Medications  Medication Sig Dispense Refill  . 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 acetaminophen (TYLENOL) 500 MG tablet Take 500 mg by mouth every 6 (six) hours as needed for mild pain, fever or headache.    . albuterol (PROVENTIL HFA;VENTOLIN HFA) 108 (90 Base) MCG/ACT inhaler Inhale 2 puffs into the lungs every 6 (six) hours as needed for wheezing or shortness of breath.    Marland Kitchen aspirin 81 MG tablet Take 81 mg by mouth daily.    Marland Kitchen atorvastatin (LIPITOR)  80 MG tablet Take 1 tablet (80 mg total) by mouth daily. 30 tablet 1  . Cholecalciferol (VITAMIN D) 50 MCG (2000 UT) CAPS Take 2,000 Units by mouth daily.    . clopidogrel (PLAVIX) 75 MG tablet Take 1 tablet (75 mg total) by mouth daily. 30 tablet 1  . Cyanocobalamin (B-12 PO) Take 1 tablet by mouth daily.    . dapagliflozin propanediol (FARXIGA) 10 MG TABS tablet Take 10 mg by mouth daily. 1 Tablet Daily    . EASY COMFORT PEN NEEDLES  31G X 6 MM MISC SMARTSIG:Injection Twice Daily    . escitalopram (LEXAPRO) 20 MG tablet Take 20 mg by mouth daily.    . fluticasone (FLONASE) 50 MCG/ACT nasal spray Place 1 spray into both nostrils daily.    . furosemide (LASIX) 40 MG tablet Take 1 tablet (40 mg total) by mouth as needed. 30 tablet 0  . gabapentin (NEURONTIN) 100 MG capsule Take 300 mg by mouth at bedtime.    . Lancets (ONETOUCH ULTRASOFT) lancets 4 (four) times daily.    Marland Kitchen levothyroxine (SYNTHROID) 137 MCG tablet Take 137 mcg by mouth daily.    Marland Kitchen LORazepam (ATIVAN) 1 MG tablet TAKE 1 AND 1/4 TABLET BY MOUTH EVERY NIGHT AT BEDTIME    . magnesium oxide (MAG-OX) 400 MG tablet Take 400 mg by mouth daily.    . metFORMIN (GLUCOPHAGE-XR) 500 MG 24 hr tablet Take 1,000 mg by mouth 2 (two) times daily.    . metoprolol succinate (TOPROL XL) 50 MG 24 hr tablet Take 1 tablet (50 mg total) by mouth daily. 90 tablet 3  . montelukast (SINGULAIR) 10 MG tablet Take 10 mg by mouth at bedtime.    Marland Kitchen NOVOLOG MIX 70/30 FLEXPEN (70-30) 100 UNIT/ML FlexPen Inject 0-70 Units into the skin 2 (two) times daily.    . polyvinyl alcohol (LIQUIFILM TEARS) 1.4 % ophthalmic solution Place 1 drop into both eyes as needed for dry eyes.    Marland Kitchen spironolactone (ALDACTONE) 25 MG tablet Take 25 mg by mouth daily.    . SYMBICORT 80-4.5 MCG/ACT inhaler Inhale 2 puffs into the lungs daily.    . traMADol (ULTRAM) 50 MG tablet Take 1 tablet (50 mg total) by mouth every 8 (eight) hours as needed. 30 tablet 0   No current facility-administered medications for this visit.    Allergies:   Cefaclor, Codeine, Penicillins, Sulfa antibiotics, Trazodone and nefazodone, Vibramycin [doxycycline calcium], and Erythromycin    Social History:  The patient  reports that she has never smoked. She has never used smokeless tobacco. She reports that she does not drink alcohol and does not use drugs.   Family History:  The patient's family history is not on file.    ROS: All other  systems are reviewed and negative. Unless otherwise mentioned in H&P    PHYSICAL EXAM: VS:  BP 126/70   Pulse (!) 55   Ht 5\' 5"  (1.651 m)   Wt 218 lb (98.9 kg)   SpO2 96%   BMI 36.28 kg/m  , BMI Body mass index is 36.28 kg/m. GEN: Well nourished, well developed, in no acute distress. Obese HEENT: normal Neck: no JVD, carotid bruits, or masses Cardiac: RRR; 2/6 systolic murmurs, rubs, or gallops,no edema  Respiratory:  Clear to auscultation bilaterally, normal work of breathing GI: soft, nontender, nondistended, + BS MS: no deformity or atrophy Skin: warm and dry, no rash Neuro:  Strength and sensation are intact Psych: euthymic mood, full affect   EKG:  Not completed this office visit  Recent Labs: 06/16/2020: ALT 16 06/19/2020: Magnesium 2.2 06/21/2020: Hemoglobin 10.3; Platelets 183 07/14/2020: BUN 21; Creatinine, Ser 1.14; Potassium 4.5; Sodium 143    Lipid Panel    Component Value Date/Time   CHOL 139 06/16/2020 0513   TRIG 116 06/16/2020 0513   HDL 34 (L) 06/16/2020 0513   CHOLHDL 4.1 06/16/2020 0513   VLDL 23 06/16/2020 0513   LDLCALC 82 06/16/2020 0513      Wt Readings from Last 3 Encounters:  08/27/20 218 lb (98.9 kg)  07/14/20 216 lb (98 kg)  07/05/20 216 lb (98 kg)      Other studies Reviewed: Echo 06/16/2020 1. Left ventricular ejection fraction, by estimation, is 35 to 40%. The  left ventricle has moderately decreased function. The left ventricle  demonstrates regional wall motion abnormalities (see scoring  diagram/findings for description). There is mild  concentric left ventricular hypertrophy. Left ventricular diastolic  parameters are consistent with Grade II diastolic dysfunction  (pseudonormalization). Elevated left ventricular end-diastolic pressure.  2. Right ventricular systolic function is normal. The right ventricular  size is normal. There is mildly elevated pulmonary artery systolic  pressure.  3. Left atrial size was mildly  dilated.  4. Right atrial size was mildly dilated.  5. The mitral valve is normal in structure. Trivial mitral valve  regurgitation. No evidence of mitral stenosis.  6. The aortic valve is grossly normal. There is mild calcification of the  aortic valve. Aortic valve regurgitation is not visualized. No aortic  stenosis is present.  7. The inferior vena cava is dilated in size with <50% respiratory  variability, suggesting right atrial pressure of 15 mmHg.   Conclusion(s)/Recommendation(s): Reduced LVEF, focal wall motion  abnormalities as noted. No LV thrombus seen with contrast.    Cath 06/16/2020  Dist RCA lesion is 100% stenosed. Left to right collaterals.  Mid RCA lesion is 50% stenosed.  Prox Cx lesion is 80% stenosed.  Mid Cx lesion is 80% stenosed.  1st Mrg lesion is 75% stenosed. This is a small vessel and may not be a target for CABG. The OM2 is a large vessel that is likely a better bypass target.  Ost LAD to Prox LAD lesion is 90% stenosed.  There is moderate left ventricular systolic dysfunction.  LV end diastolic pressure is moderately elevated.  The left ventricular ejection fraction is 35-45% by visual estimate.  There is no aortic valve stenosis.  RPDA lesion is 80% stenosed.  RPAV lesion is 80% stenosed.  Severe three vessel disease.  Will obtain CVTS consult for CABG. Medical therapy for LV dysfunction.  Likely will need grafts to LAD, OM2, PDA, PLA   ASSESSMENT AND PLAN:  1.  Chronic systolic CHF: She is only taking Lasix as needed.  She denies any heart failure symptoms, I am not seeing any signs of volume overload on my examination.  I do not feel like we need to institute Entresto at this time until I have a more up-to-date evaluation of her LV function.  I am ordering an echocardiogram be to be completed to reassess her current status.  She will follow-up with Dr. Herbie Baltimore to discuss results.  Otherwise continue current regimen.  2.   Coronary artery disease: Status post CABG completed 06/12/2020.  She continues to go to the wound care management for sternotomy wound closure.  It is almost completely healed.  She will continue to follow with CVTS as needed.  She will be beginning cardiac rehab  and is looking forward to it.  Continue her current medication regimen with metoprolol.  3.  Hypertension: Excellent control of blood pressure today.  He will continue metoprolol and spironolactone 12.5 mg daily.  I will not restart Entresto at this time until definitive evaluation of LV function is completed.  4.  Hyperlipidemia: Continue atorvastatin 80 mg daily.  Goal of LDL less than 70.  Will need fasting lipids and LFTs unless completed by PCP prior to being seen next.   Current medicines are reviewed at length with the patient today.  I have spent 25 min's  dedicated to the care of this patient on the date of this encounter to include pre-visit review of records, assessment, management and diagnostic testing,with shared decision making.  Labs/ tests ordered today include: Echocardiogram   Bettey Mare. Liborio Nixon, ANP, AACC   08/27/2020 4:33 PM    [facility] [address] Office [phone] Fax [phone]  Notice: This dictation was prepared with Dragon dictation along with smaller phrase technology. Any transcriptional errors that result from this process are unintentional and may not be corrected upon review.

## 2020-08-27 ENCOUNTER — Other Ambulatory Visit: Payer: Self-pay

## 2020-08-27 ENCOUNTER — Ambulatory Visit (INDEPENDENT_AMBULATORY_CARE_PROVIDER_SITE_OTHER): Payer: Medicare HMO | Admitting: Adult Health

## 2020-08-27 ENCOUNTER — Encounter: Payer: Self-pay | Admitting: Adult Health

## 2020-08-27 VITALS — BP 126/70 | HR 55 | Ht 65.0 in | Wt 218.0 lb

## 2020-08-27 DIAGNOSIS — Z951 Presence of aortocoronary bypass graft: Secondary | ICD-10-CM

## 2020-08-27 DIAGNOSIS — I519 Heart disease, unspecified: Secondary | ICD-10-CM | POA: Diagnosis not present

## 2020-08-27 DIAGNOSIS — I2510 Atherosclerotic heart disease of native coronary artery without angina pectoris: Secondary | ICD-10-CM | POA: Diagnosis not present

## 2020-08-27 DIAGNOSIS — I10 Essential (primary) hypertension: Secondary | ICD-10-CM

## 2020-08-27 DIAGNOSIS — I480 Paroxysmal atrial fibrillation: Secondary | ICD-10-CM

## 2020-08-27 DIAGNOSIS — E7800 Pure hypercholesterolemia, unspecified: Secondary | ICD-10-CM

## 2020-08-27 NOTE — Patient Instructions (Signed)
Medication Instructions:  Continue current medications  *If you need a refill on your cardiac medications before your next appointment, please call your pharmacy*   Lab Work: None Ordered   Testing/Procedures: Your physician has requested that you have an echocardiogram in 1 Month. Echocardiography is a painless test that uses sound waves to create images of your heart. It provides your doctor with information about the size and shape of your heart and how well your heart's chambers and valves are working. This procedure takes approximately one hour. There are no restrictions for this procedure.   Follow-Up: At [facility], you and your health needs are our priority.  As part of our continuing mission to provide you with exceptional heart care, we have created designated Provider Care Teams.  These Care Teams include your primary Cardiologist (physician) and Advanced Practice Providers (APPs -  Physician Assistants and Nurse Practitioners) who all work together to provide you with the care you need, when you need it.  We recommend signing up for the patient portal called "MyChart".  Sign up information is provided on this After Visit Summary.  MyChart is used to connect with patients for Virtual Visits (Telemedicine).  Patients are able to view lab/test results, encounter notes, upcoming appointments, etc.  Non-urgent messages can be sent to your provider as well.   To learn more about what you can do with MyChart, go to [email or web address].    Your next appointment:   2 month(s)  The format for your next appointment:   In Person  Provider:   You may see Bryan Lemma, MD or one of the following Advanced Practice Providers on your designated Care Team:    Theodore Demark, PA-C  Joni Reining, DNP, ANP

## 2020-08-30 ENCOUNTER — Other Ambulatory Visit: Payer: Self-pay

## 2020-08-30 ENCOUNTER — Ambulatory Visit: Payer: Self-pay | Admitting: Cardiothoracic Surgery

## 2020-08-30 ENCOUNTER — Encounter: Payer: Self-pay | Admitting: Cardiothoracic Surgery

## 2020-08-30 VITALS — BP 120/58 | HR 67 | Resp 20

## 2020-08-30 DIAGNOSIS — Z5189 Encounter for other specified aftercare: Secondary | ICD-10-CM

## 2020-09-06 NOTE — Progress Notes (Signed)
The patient returns for wound check.  She is status post CABG on 06/18/2020.  She did well except developed some minor breakdown of the midportion of her incision.  This has been packed at home and with the assistance of home health.  She is back to work recently.  Physical exam:  BP (!) 120/58 (BP Location: Left Arm, Patient Position: Sitting)   Pulse 67   Resp 20   SpO2 91%  Well-appearing lady in no acute distress Clear to auscultation bilaterally Regular rate and rhythm Wound: Granulated and small defect.    Impression: Doing well after CABG with a resolving stroke sternal wound defect   Plan: Recommend application of Xeroform or petroleum gauze to the wound allowing it to epithelialize. Follow-up as needed.  Damarien Nyman Z. Vickey Sages, MD [phone]

## 2020-09-10 ENCOUNTER — Other Ambulatory Visit: Payer: Self-pay | Admitting: Physician Assistant

## 2020-09-21 DIAGNOSIS — I5030 Unspecified diastolic (congestive) heart failure: Secondary | ICD-10-CM

## 2020-09-21 HISTORY — DX: Unspecified diastolic (congestive) heart failure: I50.30

## 2020-09-21 HISTORY — PX: TRANSTHORACIC ECHOCARDIOGRAM: SHX275

## 2020-09-27 ENCOUNTER — Other Ambulatory Visit: Payer: Self-pay

## 2020-09-27 ENCOUNTER — Ambulatory Visit (HOSPITAL_COMMUNITY): Payer: Medicare HMO | Attending: Cardiology

## 2020-09-27 DIAGNOSIS — I5022 Chronic systolic (congestive) heart failure: Secondary | ICD-10-CM | POA: Diagnosis not present

## 2020-09-27 DIAGNOSIS — I519 Heart disease, unspecified: Secondary | ICD-10-CM | POA: Diagnosis not present

## 2020-09-27 LAB — ECHOCARDIOGRAM COMPLETE
Area-P 1/2: 1.81 cm2
S' Lateral: 2.8 cm

## 2020-09-27 MED ORDER — PERFLUTREN LIPID MICROSPHERE
1.0000 mL | INTRAVENOUS | Status: AC | PRN
  Administered 2020-09-27: 1 mL via INTRAVENOUS

## 2020-11-01 ENCOUNTER — Other Ambulatory Visit: Payer: Self-pay

## 2020-11-01 ENCOUNTER — Ambulatory Visit: Payer: Medicare HMO | Admitting: Cardiology

## 2020-11-01 ENCOUNTER — Encounter: Payer: Self-pay | Admitting: Cardiology

## 2020-11-01 VITALS — BP 136/66 | HR 57 | Ht 65.0 in | Wt 213.0 lb

## 2020-11-01 DIAGNOSIS — I10 Essential (primary) hypertension: Secondary | ICD-10-CM

## 2020-11-01 DIAGNOSIS — I9789 Other postprocedural complications and disorders of the circulatory system, not elsewhere classified: Secondary | ICD-10-CM

## 2020-11-01 DIAGNOSIS — I255 Ischemic cardiomyopathy: Secondary | ICD-10-CM

## 2020-11-01 DIAGNOSIS — Z951 Presence of aortocoronary bypass graft: Secondary | ICD-10-CM | POA: Diagnosis not present

## 2020-11-01 DIAGNOSIS — I214 Non-ST elevation (NSTEMI) myocardial infarction: Secondary | ICD-10-CM | POA: Diagnosis not present

## 2020-11-01 DIAGNOSIS — E669 Obesity, unspecified: Secondary | ICD-10-CM

## 2020-11-01 DIAGNOSIS — E785 Hyperlipidemia, unspecified: Secondary | ICD-10-CM

## 2020-11-01 DIAGNOSIS — E1169 Type 2 diabetes mellitus with other specified complication: Secondary | ICD-10-CM | POA: Diagnosis not present

## 2020-11-01 DIAGNOSIS — I5032 Chronic diastolic (congestive) heart failure: Secondary | ICD-10-CM

## 2020-11-01 DIAGNOSIS — Z79899 Other long term (current) drug therapy: Secondary | ICD-10-CM

## 2020-11-01 DIAGNOSIS — I2510 Atherosclerotic heart disease of native coronary artery without angina pectoris: Secondary | ICD-10-CM

## 2020-11-01 DIAGNOSIS — I4891 Unspecified atrial fibrillation: Secondary | ICD-10-CM

## 2020-11-01 MED ORDER — ENTRESTO 24-26 MG PO TABS: 1.0000 | ORAL_TABLET | Freq: Two times a day (BID) | ORAL | 3 refills | Status: DC

## 2020-11-01 NOTE — Progress Notes (Signed)
Primary Care Provider: Zoila Shutter, MD Cardiologist: Bryan Lemma, MD  Cardiac Surgeon: Dr. Vickey Sages Electrophysiologist: None  Clinic Note: Chief Complaint  Patient presents with  . Follow-up    3-4 weeks.  . Coronary Artery Disease    No further angina  . Cardiomyopathy    Resolved on follow-up echocardiogram.   ===================================  ASSESSMENT/PLAN   Problem List Items Addressed This Visit    S/P CABG x 4 - Primary (Chronic)   Relevant Orders   CBC   Comprehensive metabolic panel   Multiple vessel coronary artery disease (Chronic)    Severe multivessel disease.  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 EF improved.  Plan:  Continue DAPT with aspirin and Plavix for non-STEMI.  Okay to be interrupted as of end of May 2022 for any procedures.  Plan to continue for minimum 1 year.  Okay to hold both aspirin and Plavix 5 days preop for surgeries or procedures as of December 22, 2020  Continue high-dose statin for now.  Continue Farxiga and metformin.  Continue beta-blocker at current dose.  We will reattempt starting Entresto, will need to have labs followed up closely.  Continue with reduced dose of spironolactone.  Continue current rehab      Relevant Medications   sacubitril-valsartan (ENTRESTO) 24-26 MG   Hyperlipidemia associated with type 2 diabetes mellitus (HCC) (Chronic)    Borderline lipid control the time of her MI.  I suspect that she would be at goal now on high-dose atorvastatin.  Due for lab follow-up.  Continue atorvastatin check CMP along with lipid panel short after starting Entresto.      Relevant Orders   Lipid panel   Comprehensive metabolic panel   Postoperative atrial fibrillation (HCC) (Chronic)    No longer on amiodarone.  Remains on Toprol  50 mg daily.  Has borderline bradycardia.  Would not titrate further.  As far she can tell, has not any further breakthrough spells.  This may have been a one-time postop event, as long as he has no further episodes, would probably avoid DOAC. Continue aspirin Plavix along with beta-blocker.      Relevant Medications   sacubitril-valsartan (ENTRESTO) 24-26 MG   NSTEMI (non-ST elevated myocardial infarction) (HCC) (Chronic)    4-1/2 months out from non-STEMI and CABG for multivessel disease.  Follow-up echocardiogram shows notable improvement in EF.  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  No further angina.  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        Relevant Medications   sacubitril-valsartan (ENTRESTO) 24-26 MG   Other Relevant Orders   Lipid panel   CBC   Comprehensive metabolic panel   Obesity (BMI 78-29.5) (Chronic)   Relevant Orders   Lipid panel   Essential hypertension (Chronic)    Blood pressure is within normal range here, I think she can probably tolerate the Entresto.  Will order CMP shortly after starting.  She is due for follow-up lipids as well.  Continue beta-blocker and spironolactone      Relevant Medications   sacubitril-valsartan (ENTRESTO) 24-26 MG   Ischemic cardiomyopathy (Chronic)    Initial EF was read as 35 to 40%, follow-up echo was up to normal range 55%.  Still has regional  wall motion abnormalities.   NYHA Class I-II CHF symptoms.  Plan: Continue with current dose of Toprol, and spironolactone. Continue Farxiga along with PRN Lasix. Initiate Entresto 24-26 mg with close lab follow-up.        Relevant Medications   sacubitril-valsartan (ENTRESTO) 24-26 MG   (HFpEF) heart failure with preserved ejection fraction (HCC) (Chronic)    EF improved from 35 to 40% up to 55% 3 months post CABG.  GRII DD still present.  Euvolemic on exam.  NYHA class mostly 1, 2 at most  symptoms.   Currently on Toprol, spironolactone and Farxiga.    Adding back Entresto.  PRN Lasix.      Relevant Medications   sacubitril-valsartan (ENTRESTO) 24-26 MG    Other Visit Diagnoses    Medication management       Relevant Orders   CBC   Comprehensive metabolic panel      ===================================  HPI:    Angela Frazier is a 75 y.o. female with a PMH notable for recent non-STEMI in November 2021 revealing multivessel CAD-referred for CABG x4, along with untreated OSA, HTN, DM-2 (with neuropathy), HLD and hypothyroidism.  She presents today for 44-month follow-up.   Non-STEMI 06/15/2020:  Cardiac Catheterization: multivessel CAD. => CABG  CABG x4 (LIMA-dLAD, SVG-PDA, SVG-OM2, rRad-rPLA)  Post-Op Afib Rx w/ Amiodarone.  Not placed on DOAC  Had some minor breakdown of the midportion of her sternotomy incision.    Echocardiogram: EF 35 - 40% w/ Gr 2 DD.    Discharged on ASA/Plavix, 80 mg atorvastatin, Farxiga, furosemide 40 mg daily, Isordil 20 mg 3 times daily, spironolactone 50 mg twice daily, amiodarone 20 mg twice daily.  Seen 2 x in Dec 2021 by PA's.:  07/01/20 - Noted heart rates in the 40s and 50s with intermittent dizziness.Sherryll Burger Added, & Amio reduced to 200 mg daily  Follow-up labs-creatinine up to 1.8 => Entresto DC'd  Seen by Dr. Renaldo Fiddler: Isordil, K-Dur, Lasix and amiodarone DC.  07/14/20 -euvolemic.  No chest pain or pressure.  No significant dyspnea.  Spironolactone reduced to 25 mg & Lasix made PRN.   Nguyen Butler was last seen on August 27, 2020 by Joni Reining, NP: No major complaints.  No exertional dyspnea or chest discomfort.  No fatigue.  Was getting ready to start cardiac rehab ([facility]).  Blood sugar well controlled.  She now was approved for [facility]  She was seen by Dr. Vickey Sages on February 14 for final follow-up to reassess the sternal wound breakdown.  Recommended Xeroform petroleum gauze  dressing.  Recent Hospitalizations: None since hospitalization for non-STEMI in November 2021  Reviewed  CV studies:    The following studies were reviewed today: (if available, images/films reviewed: From Epic Chart or Care Everywhere) . ECHO 06/16/2020: EF 35 to 40%.  GRII DD.  Midodrine distal anterior and apical akinesis.  Mid and distal lateral wall, mid and distal anterior septum, entire inferior wall, mid anterolateral segment, mid inferolateral segment and basal anterior segment hypokinesis.  Mild biatrial enlargement.  Elevated RAP ~15 mmHg. Marland Kitchen LHC 06/16/2020: mRCA 50%, dRCA 100% (L-R collaterals), rPDA & rPAV both ~80%. Ost-Prox LAD 90%, prox-mid LCx tandem focal 80% & small OM1 75% (less favorable CABG target - OM2 larger).      06/18/2020: Intra-Op TEE-postop EF 55%.  Apparently normal wall motion.  Preop noted anterior akinesis and anteroseptal hypokinesis.  Inferior wall not well visualized.  Echo 09/22/2018: EF 55 to 60%.  Normal function.  Basal  inferior akinesis persists.  GRII DD.  Normal RV with normal PAP.  Mild LA dilation.  Mild aortic valve sclerosis but no stenosis.   Interval History:   Jaquesha Boroff returns here today for close follow-up really without any major cardiac complaints.  She is doing well.  She is started doing cardiac rehab and is very much enjoying it.  No chest pain or pressure with rest or exertion.  No PND, orthopnea or edema.  She is actually back to work now working 2 days a week for 5 hours at a time.  Getting gradually ease back into it. She is no longer on amiodarone.  Has had no breakthrough episodes of atrial fibrillation that she can tell.  No palpitations or irregular heartbeats.  CV Review of Symptoms (Summary): no chest pain or dyspnea on exertion negative for - edema, irregular heartbeat, orthopnea, palpitations, paroxysmal nocturnal dyspnea, rapid heart rate, shortness of breath or syncope/near syncope; TIA/amaourosis fugax,  claudication  The patient does not have symptoms concerning for COVID-19 infection (fever, chills, cough, or new shortness of breath).   REVIEWED OF SYSTEMS   Review of Systems  Constitutional: Positive for weight loss (intentional). Negative for chills, fever and malaise/fatigue.  HENT: Negative for congestion.   Respiratory: Negative for cough and shortness of breath.   Cardiovascular: Negative for leg swelling.  Gastrointestinal: Negative for blood in stool and melena.  Genitourinary: Negative for hematuria.  Musculoskeletal: Negative for joint pain.       L arm has been sore for a while - thinks she must have pulled something.   Neurological: Negative for dizziness and focal weakness.  Endo/Heme/Allergies: Negative for environmental allergies. Does not bruise/bleed easily.  Psychiatric/Behavioral: Negative for depression and memory loss. The patient is not nervous/anxious and does not have insomnia.     I have reviewed and (if needed) personally updated the patient's problem list, medications, allergies, past medical and surgical history, social and family history.   PAST MEDICAL HISTORY   Past Medical History:  Diagnosis Date  . (HFpEF) heart failure with preserved ejection fraction (HCC) 09/21/2020   EF improved from 35 to 40% up to 55% 3 months post CABG.  GRII DD still present  . Arthritis   . Asthma   . Diabetes mellitus, type II, insulin dependent (HCC)   . Diabetic neuropathy (HCC)   . Essential hypertension   . Hyperlipidemia associated with type 2 diabetes mellitus (HCC)   . Hypothyroidism   . Multiple vessel coronary artery disease 06/15/2020   LHC 06/16/2020: mRCA 50%, dRCA 100% (L-R collaterals), rPDA & rPAV both ~80%. Ost-Prox LAD 90%, prox-mid LCx tandem focal 80% & small OM1 75% (less favorable CABG target - OM2 larger).   . NSTEMI (non-ST elevated myocardial infarction) (HCC) 06/15/2020   Cardiac cath noted multivessel disease-referred for CABG.  .  Postoperative atrial fibrillation (HCC) 06/17/2020   Discharged on amiodarone, no recurrence off of amiodarone.  Not on DOAC  . S/P CABG x 4 06/18/2020   LIMA-LAD, SVG-OM1, SVG-RPDA,  rRad-RPL (Dr. Vickey Sages)    PAST SURGICAL HISTORY   Past Surgical History:  Procedure Laterality Date  . CORONARY ARTERY BYPASS GRAFT N/A 06/18/2020   Procedure: CORONARY ARTERY BYPASS GRAFTING (CABG), ON PUMP, TIMES FOUR, USING LEFT INTERNAL MAMMARY ARTERY, ENDOSCOPICALLY HARVESTED RIGHT GREATER SAPHENOUS VEIN, AND LEFT RADIAL ARTERY (OPEN HARVEST);  Surgeon: Linden Dolin, MD;  Location: [facility] OR;  Service: Open Heart Surgery;  LIMA-LAD, SVG-OM2, SVG-RPDA, rRad-RPL   . LEFT HEART CATH  AND CORONARY ANGIOGRAPHY N/A 06/16/2020   Procedure: LEFT HEART CATH AND CORONARY ANGIOGRAPHY;  Surgeon: Corky CraftsVaranasi, Jayadeep S, MD;  Location: [facility] INVASIVE CV LAB; NSTEMI:  mRCA 50%, dRCA 100% (L-R collaterals), rPDA & rPAV both ~80%. Ost-Prox LAD 90%, prox-mid LCx tandem focal 80% & small OM1 75% (less favorable CABG target - OM2 larger).   Marland Kitchen. RADIAL ARTERY HARVEST Left 06/18/2020   Procedure: RADIAL ARTERY HARVEST;  Surgeon: Linden DolinAtkins, Broadus Z, MD;  Location: [facility] OR;  Service: Open Heart Surgery;  Laterality: Left;  . TEE WITHOUT CARDIOVERSION N/A 06/18/2020   Procedure: TRANSESOPHAGEAL ECHOCARDIOGRAM (TEE);  Surgeon: Linden DolinAtkins, Broadus Z, MD;  [facility];  Service: Open Heart Surgery; Intra-Op TEE-postop EF 55%.  Apparently normal wall motion.  Preop noted anterior akinesis and anteroseptal hypokinesis.  Inferior wall not well visualized.  . THYROID SURGERY    . TRANSTHORACIC ECHOCARDIOGRAM  06/16/2020   (NSTEMI) EF 35 to 40%.  GRII DD.  Midodrine distal anterior and apical akinesis.  Mid and distal lateral wall, mid and distal anterior septum, entire inferior wall, mid anterolateral segment, mid inferolateral segment and basal anterior segment hypokinesis.  Mild biatrial enlargement.  Elevated RAP ~15 mmHg.  Marland Kitchen. TRANSTHORACIC  ECHOCARDIOGRAM  09/21/2020   66-Month Follow-Up Echo: EF 55 to 60%.  Normal function.  Basal inferior akinesis persists.  GRII DD.  Normal RV with normal PAP.  Mild LA dilation.  Mild aortic valve sclerosis but no stenosis.  . TUBAL LIGATION      Immunization History  Administered Date(s) Administered  . PFIZER(Purple Top)SARS-COV-2 Vaccination 08/28/2019, 09/18/2019  . Tdap 09/23/2015    MEDICATIONS/ALLERGIES   Current Meds  Medication Sig  . 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 acetaminophen (TYLENOL) 500 MG tablet Take 500 mg by mouth every 6 (six) hours as needed for mild pain, fever or headache.  . albuterol (PROVENTIL HFA;VENTOLIN HFA) 108 (90 Base) MCG/ACT inhaler Inhale 2 puffs into the lungs every 6 (six) hours as needed for wheezing or shortness of breath.  Marland Kitchen. aspirin 81 MG tablet Take 81 mg by mouth daily.  Marland Kitchen. atorvastatin (LIPITOR) 80 MG tablet Take 1 tablet (80 mg total) by mouth daily.  . Cholecalciferol (VITAMIN D) 50 MCG (2000 UT) CAPS Take 2,000 Units by mouth daily.  . clopidogrel (PLAVIX) 75 MG tablet Take 1 tablet (75 mg total) by mouth daily.  . Cyanocobalamin (B-12 PO) Take 1 tablet by mouth daily.  . dapagliflozin propanediol (FARXIGA) 10 MG TABS tablet Take 10 mg by mouth daily. 1 Tablet Daily  . EASY COMFORT PEN NEEDLES 31G X 6 MM MISC SMARTSIG:Injection Twice Daily  . escitalopram (LEXAPRO) 20 MG tablet Take 20 mg by mouth daily.  . fluticasone (FLONASE) 50 MCG/ACT nasal spray Place 1 spray into both nostrils daily.  . furosemide (LASIX) 40 MG tablet TAKE 1 TABLET BY MOUTH EVERY DAY  . gabapentin (NEURONTIN) 100 MG capsule Take 300 mg by mouth at bedtime.  . Lancets (ONETOUCH ULTRASOFT) lancets 4 (four) times daily.  Marland Kitchen. levothyroxine (SYNTHROID) 137 MCG tablet Take 137 mcg by mouth daily.  Marland Kitchen. LORazepam (ATIVAN) 1 MG tablet TAKE 1 AND 1/4 TABLET BY MOUTH EVERY NIGHT AT BEDTIME  . magnesium oxide (MAG-OX) 400 MG tablet  Take 400 mg by mouth daily.  . metFORMIN (GLUCOPHAGE-XR) 500 MG 24 hr tablet Take 1,000 mg by mouth 2 (two) times daily.  . metoprolol succinate (TOPROL XL) 50 MG 24 hr tablet Take 1  tablet (50 mg total) by mouth daily.  . montelukast (SINGULAIR) 10 MG tablet Take 10 mg by mouth at bedtime.  Marland Kitchen NOVOLOG MIX 70/30 FLEXPEN (70-30) 100 UNIT/ML FlexPen Inject 0-70 Units into the skin 2 (two) times daily.  . polyvinyl alcohol (LIQUIFILM TEARS) 1.4 % ophthalmic solution Place 1 drop into both eyes as needed for dry eyes.  . sacubitril-valsartan (ENTRESTO) 24-26 MG Take 1 tablet by mouth 2 (two) times daily.  Marland Kitchen spironolactone (ALDACTONE) 25 MG tablet Take 25 mg by mouth daily.  . SYMBICORT 80-4.5 MCG/ACT inhaler Inhale 2 puffs into the lungs daily.  . traMADol (ULTRAM) 50 MG tablet TAKE 1 TABLET (50 MG TOTAL) BY MOUTH EVERY EIGHT HOURS AS NEEDED.    Allergies  Allergen Reactions  . Cefaclor   . Codeine   . Penicillins   . Sulfa Antibiotics   . Trazodone And Nefazodone   . Vibramycin [Doxycycline Calcium]   . Erythromycin Rash    SOCIAL HISTORY/FAMILY HISTORY   Reviewed in Epic:  Pertinent findings:  Social History   Tobacco Use  . Smoking status: Never Smoker  . Smokeless tobacco: Never Used  Vaping Use  . Vaping Use: Never used  Substance Use Topics  . Alcohol use: No  . Drug use: Never   Social History   Social History Narrative  . Not on file    OBJCTIVE -PE, EKG, labs   Wt Readings from Last 3 Encounters:  11/01/20 213 lb (96.6 kg)  08/27/20 218 lb (98.9 kg)  07/14/20 216 lb (98 kg)    Physical Exam: BP 136/66 (BP Location: Left Arm, Patient Position: Sitting, Cuff Size: Large)   Pulse (!) 57   Ht 5\' 5"  (1.651 m)   Wt 213 lb (96.6 kg)   BMI 35.45 kg/m  Physical Exam Constitutional:      General: She is not in acute distress.    Appearance: Normal appearance. She is obese. She is not toxic-appearing.     Comments: Well-groomed.  Healthy-appearing.  HENT:      Head: Normocephalic and atraumatic.  Neck:     Vascular: No carotid bruit (Radiated aortic valve on).  Cardiovascular:     Rate and Rhythm: Regular rhythm. Bradycardia present.     Pulses: Normal pulses.     Heart sounds: Murmur (1-2/6 SEM at RUSB.) heard.  No friction rub. No gallop.   Pulmonary:     Effort: Pulmonary effort is normal. No respiratory distress.     Breath sounds: Normal breath sounds.  Chest:     Chest wall: No tenderness.  Musculoskeletal:        General: No swelling. Normal range of motion.     Cervical back: Normal range of motion and neck supple.     Comments: Sternal wound seems well-healed.  No further dehiscence or breakdown.  Skin:    General: Skin is warm and dry.  Neurological:     General: No focal deficit present.     Mental Status: She is alert and oriented to person, place, and time.     Motor: No weakness.     Gait: Gait normal.  Psychiatric:        Mood and Affect: Mood normal.        Behavior: Behavior normal.        Thought Content: Thought content normal.        Judgment: Judgment normal.     Adult ECG Report Not checked  Recent Labs:   Lab Results  Component Value Date   CREATININE 1.14 (H) 07/14/2020   CREATININE 1.80 (H) 07/01/2020   CREATININE 1.41 (H) 06/25/2020    Lab Results  Component Value Date   CHOL 139 06/16/2020   HDL 34 (L) 06/16/2020   LDLCALC 82 06/16/2020   TRIG 116 06/16/2020   CHOLHDL 4.1 06/16/2020   Lab Results  Component Value Date   CREATININE 1.14 (H) 07/14/2020   BUN 21 07/14/2020   NA 143 07/14/2020   K 4.5 07/14/2020   CL 104 07/14/2020   CO2 22 07/14/2020   CBC Latest Ref Rng & Units 06/21/2020 06/20/2020 06/19/2020  WBC 4.0 - 10.5 K/uL 13.7(H) 11.9(H) 11.9(H)  Hemoglobin 12.0 - 15.0 g/dL 10.3(L) 9.7(L) 9.8(L)  Hematocrit 36.0 - 46.0 % 31.6(L) 30.2(L) 30.5(L)  Platelets 150 - 400 K/uL 183 139(L) 144(L)    No results found for:  TSH  ==================================================  COVID-19 Education: The signs and symptoms of COVID-19 were discussed with the patient and how to seek care for testing (follow up with PCP or arrange E-visit).   The importance of social distancing and COVID-19 vaccination was discussed today. The patient is practicing social distancing & Masking.   I spent a total of with the patient spent in direct patient consultation.  Additional time spent with chart review  / charting (studies, outside notes, etc): 30 min Total Time: 62 min   Current medicines are reviewed at length with the patient today.  (+/- concerns) voiced concerns about Entresto.  Apparently was approved for Entresto, but never received it.  Also has concerns about Marcelline Deist continued coverage from [employer] => see follow-up note below.  This visit occurred during the SARS-CoV-2 public health emergency.  Safety protocols were in place, including screening questions prior to the visit, additional usage of staff PPE, and extensive cleaning of exam room while observing appropriate contact time as indicated for disinfecting solutions.  Notice: This dictation was prepared with Dragon dictation along with smaller phrase technology. Any transcriptional errors that result from this process are unintentional and may not be corrected upon review.  Patient Instructions / Medication Changes & Studies & Tests Ordered   Patient Instructions  Medication Instructions:  Begin taking Entresto 24-26mg  (1 tablet) twice a day.  *If you need a refill on your cardiac medications before your next appointment, please call your pharmacy*   Lab Work: FASTING Lipids, CMP, and CBC in the next week.   If you have labs (blood work) drawn today and your tests are completely normal, you will receive your results only by: Marland Kitchen MyChart Message (if you have MyChart) OR . A paper copy in the mail If you have any lab test that is abnormal or  we need to change your treatment, we will call you to review the results.   Testing/Procedures: None ordered.    Follow-Up: At [facility], you and your health needs are our priority.  As part of our continuing mission to provide you with exceptional heart care, we have created designated Provider Care Teams.  These Care Teams include your primary Cardiologist (physician) and Advanced Practice Providers (APPs -  Physician Assistants and Nurse Practitioners) who all work together to provide you with the care you need, when you need it.  We recommend signing up for the patient portal called "MyChart".  Sign up information is provided on this After Visit Summary.  MyChart is used to connect with patients for Virtual Visits (Telemedicine).  Patients are able to view lab/test results, encounter  notes, upcoming appointments, etc.  Non-urgent messages can be sent to your provider as well.   To learn more about what you can do with MyChart, go to [email or web address].    Your next appointment:   6 month(s)  The format for your next appointment:   In Person  Provider:   Bryan Lemma, MD       Studies Ordered:   Orders Placed This Encounter  Procedures  . Lipid panel  . CBC  . Comprehensive metabolic panel    Follow-up by Burt Knack, RN identified that the patient had been approved for [facility] from [employer], and [employer] for [employer]  Information on how to get the paperwork completed was provided so that both prescriptions can be filled.   Bryan Lemma, M.D., M.S. Interventional Cardiologist   Pager # [phone] Phone # [phone] [address] [place], [place] [address]   Thank you for choosing [facility] at [facility]!!

## 2020-11-01 NOTE — Patient Instructions (Addendum)
Medication Instructions:  Begin taking Entresto 24-26mg  (1 tablet) twice a day.  *If you need a refill on your cardiac medications before your next appointment, please call your pharmacy*   Lab Work: FASTING Lipids, CMP, and CBC in the next week.   If you have labs (blood work) drawn today and your tests are completely normal, you will receive your results only by: Marland Kitchen MyChart Message (if you have MyChart) OR . A paper copy in the mail If you have any lab test that is abnormal or we need to change your treatment, we will call you to review the results.   Testing/Procedures: None ordered.    Follow-Up: At [facility], you and your health needs are our priority.  As part of our continuing mission to provide you with exceptional heart care, we have created designated Provider Care Teams.  These Care Teams include your primary Cardiologist (physician) and Advanced Practice Providers (APPs -  Physician Assistants and Nurse Practitioners) who all work together to provide you with the care you need, when you need it.  We recommend signing up for the patient portal called "MyChart".  Sign up information is provided on this After Visit Summary.  MyChart is used to connect with patients for Virtual Visits (Telemedicine).  Patients are able to view lab/test results, encounter notes, upcoming appointments, etc.  Non-urgent messages can be sent to your provider as well.   To learn more about what you can do with MyChart, go to [email or web address].    Your next appointment:   6 month(s)  The format for your next appointment:   In Person  Provider:   Bryan Lemma, MD

## 2020-11-02 ENCOUNTER — Telehealth: Payer: Self-pay | Admitting: *Deleted

## 2020-11-02 NOTE — Telephone Encounter (Signed)
Called home number-spoke to patient. Per conversation from 11/01/20 at recent office visit - Patient states she was approved for medication Sherryll Burger but never has received medication . She also was approved for [place] and received the first shipment ,but not sure if it will continue.   RN informed patient both patient assistance was contacted.  Here is the information:  [place] and [place] ( [place] Farxiga) 1 [phone]. Per representative . Patient has a prescription good until Dec 2023. The next shipment of medication will be mailed @ 11/14/20. It is on automatic refill and mailing.  [employer] ( patient assistance for [employer] ) representative states patient had been approved , but no medication has been shipped ,due to [employer] [facility] patient assistance program has been try to get in touch with patient to confirm mailing address and phone number without success. Representative asked RN if message would given to patient patient to contact program to start medication . The  Authorization was good through Dec 2023.  RN gave patient phone to contact [employer] [phone]. Patient is aware she will need to call this number to finalize receiving Entresto. Patient verbalized understanding

## 2020-11-02 NOTE — Telephone Encounter (Signed)
Called no answer , left message to call back - to discuss about  Patient assistance for Entresto 24/26 mg and Farxiga.

## 2020-11-18 ENCOUNTER — Encounter: Payer: Self-pay | Admitting: Cardiology

## 2020-11-18 DIAGNOSIS — I255 Ischemic cardiomyopathy: Secondary | ICD-10-CM | POA: Insufficient documentation

## 2020-11-18 NOTE — Assessment & Plan Note (Signed)
No longer on amiodarone.  Remains on Toprol 50 mg daily.  Has borderline bradycardia.  Would not titrate further.  As far she can tell, has not any further breakthrough spells.  This may have been a one-time postop event, as long as he has no further episodes, would probably avoid DOAC. Continue aspirin Plavix along with beta-blocker.

## 2020-11-18 NOTE — Assessment & Plan Note (Addendum)
Initial EF was read as 35 to 40%, follow-up echo was up to normal range 55%.  Still has regional wall motion abnormalities.   NYHA Class I-II CHF symptoms.  Plan: Continue with current dose of Toprol, and spironolactone. Continue Farxiga along with PRN Lasix. Initiate Entresto 24-26 mg with close lab follow-up.

## 2020-11-18 NOTE — Assessment & Plan Note (Signed)
Severe multivessel disease.  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 EF improved.  Plan:  Continue DAPT with aspirin and Plavix for non-STEMI.  Okay to be interrupted as of end of May 2022 for any procedures.  Plan to continue for minimum 1 year.  Okay to hold both aspirin and Plavix 5 days preop for surgeries or procedures as of December 22, 2020  Continue high-dose statin for now.  Continue Farxiga and metformin.  Continue beta-blocker at current dose.  We will reattempt starting Entresto, will need to have labs followed up closely.  Continue with reduced dose of spironolactone.  Continue current rehab

## 2020-11-18 NOTE — Assessment & Plan Note (Signed)
Blood pressure is within normal range here, I think she can probably tolerate the Entresto.  Will order CMP shortly after starting.  She is due for follow-up lipids as well.  Continue beta-blocker and spironolactone

## 2020-11-18 NOTE — Assessment & Plan Note (Signed)
4-1/2 months out from non-STEMI and CABG for multivessel disease.  Follow-up echocardiogram shows notable improvement in EF.  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  No further angina.  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

## 2020-11-18 NOTE — Assessment & Plan Note (Signed)
Borderline lipid control the time of her MI.  I suspect that she would be at goal now on high-dose atorvastatin.  Due for lab follow-up.  Continue atorvastatin check CMP along with lipid panel short after starting Entresto.

## 2020-11-18 NOTE — Assessment & Plan Note (Deleted)
Noted severe three-vessel disease

## 2020-11-18 NOTE — Assessment & Plan Note (Addendum)
EF improved from 35 to 40% up to 55% 3 months post CABG.  GRII DD still present.  Euvolemic on exam.  NYHA class mostly 1, 2 at most symptoms.   Currently on Toprol, spironolactone and Farxiga.    Adding back Entresto.  PRN Lasix.

## 2020-11-23 ENCOUNTER — Other Ambulatory Visit: Payer: Self-pay | Admitting: Family Medicine

## 2021-05-03 NOTE — Progress Notes (Signed)
The intended billing for this encounter was the office visit will not no charge.

## 2021-06-13 ENCOUNTER — Other Ambulatory Visit: Payer: Self-pay

## 2021-06-13 MED ORDER — ENTRESTO 24-26 MG PO TABS: 1.0000 | ORAL_TABLET | Freq: Two times a day (BID) | ORAL | 3 refills | Status: AC

## 2021-06-13 NOTE — Telephone Encounter (Signed)
This is Dr. Harding's pt. °

## 2022-03-04 IMAGING — CR DG CHEST 2V
2 series · 2 of 2 positions shown · non-contrast
Comparison: None.

CLINICAL DATA: Chest pain

EXAM:
CHEST - 2 VIEW

[w chest pa]
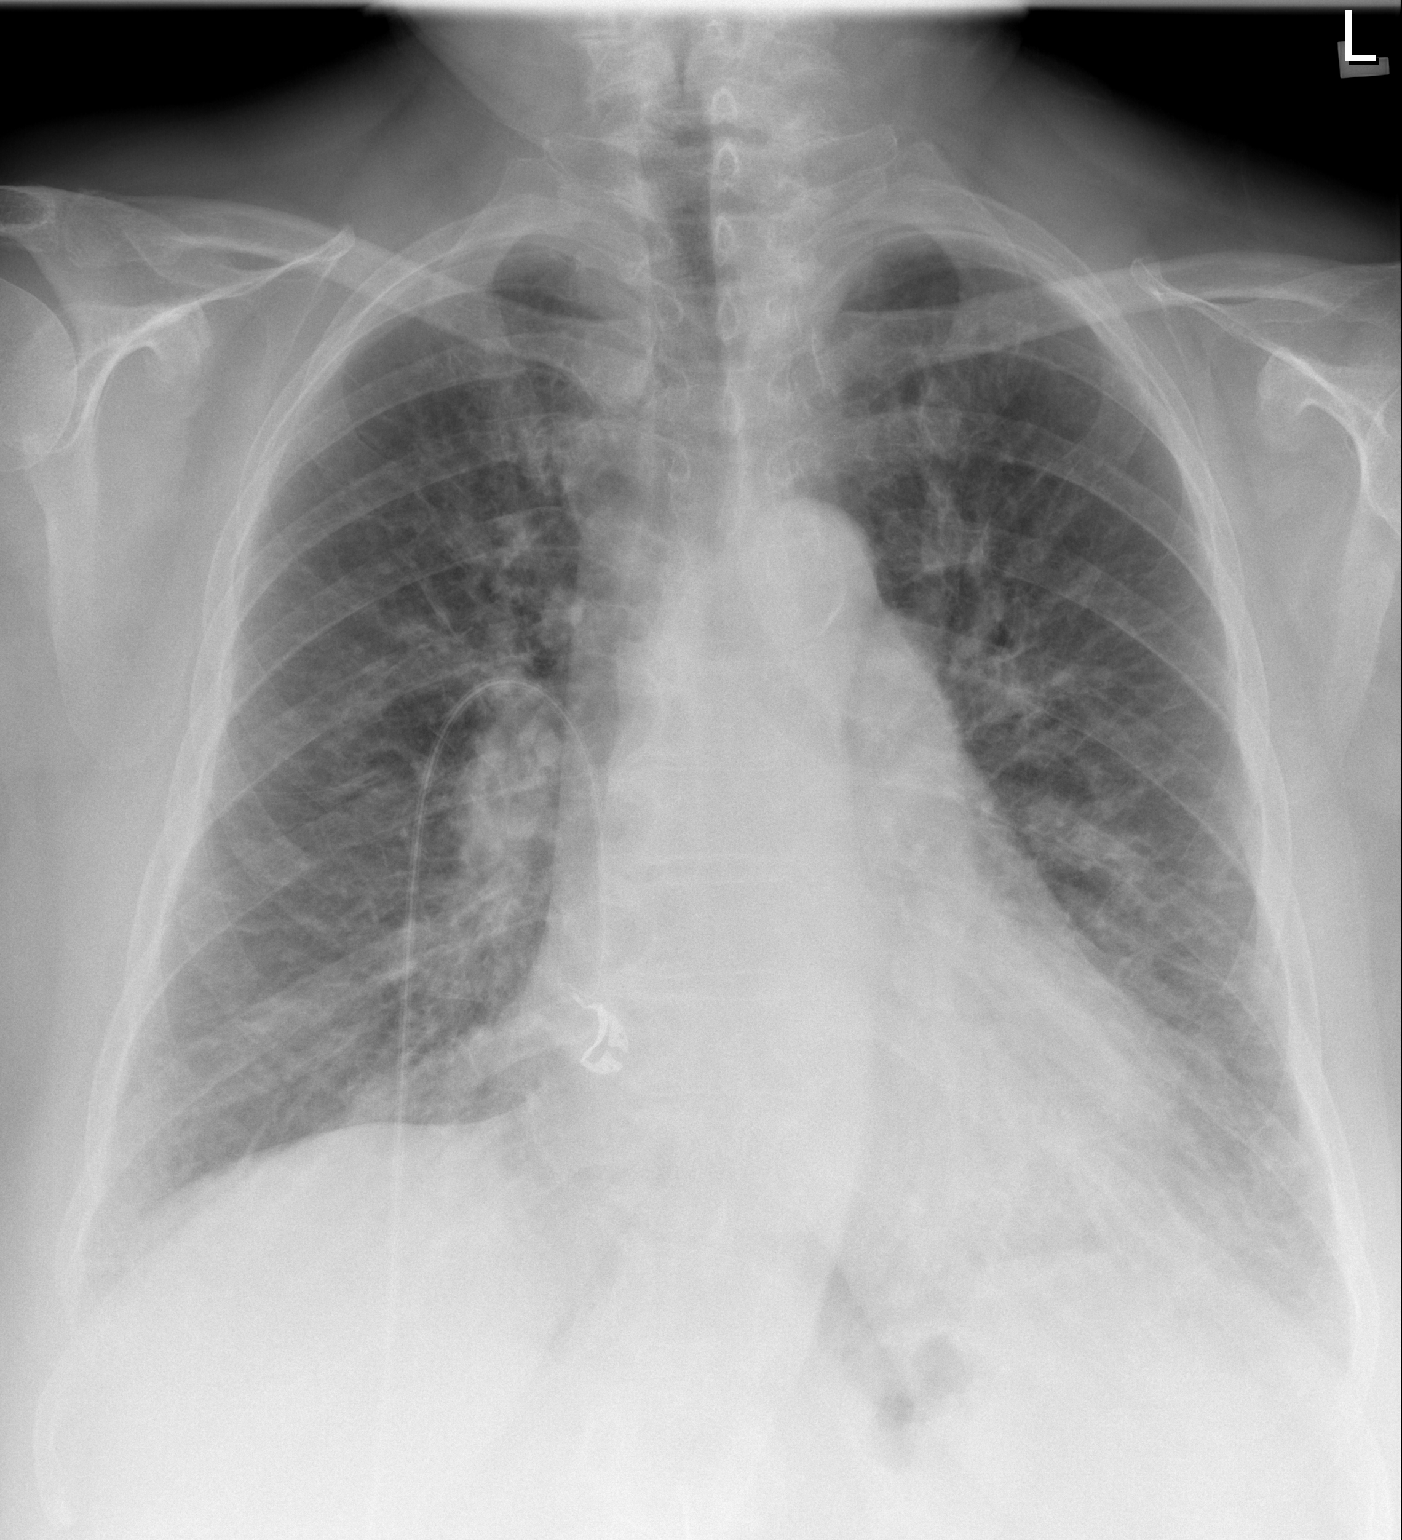

[w chest lat]
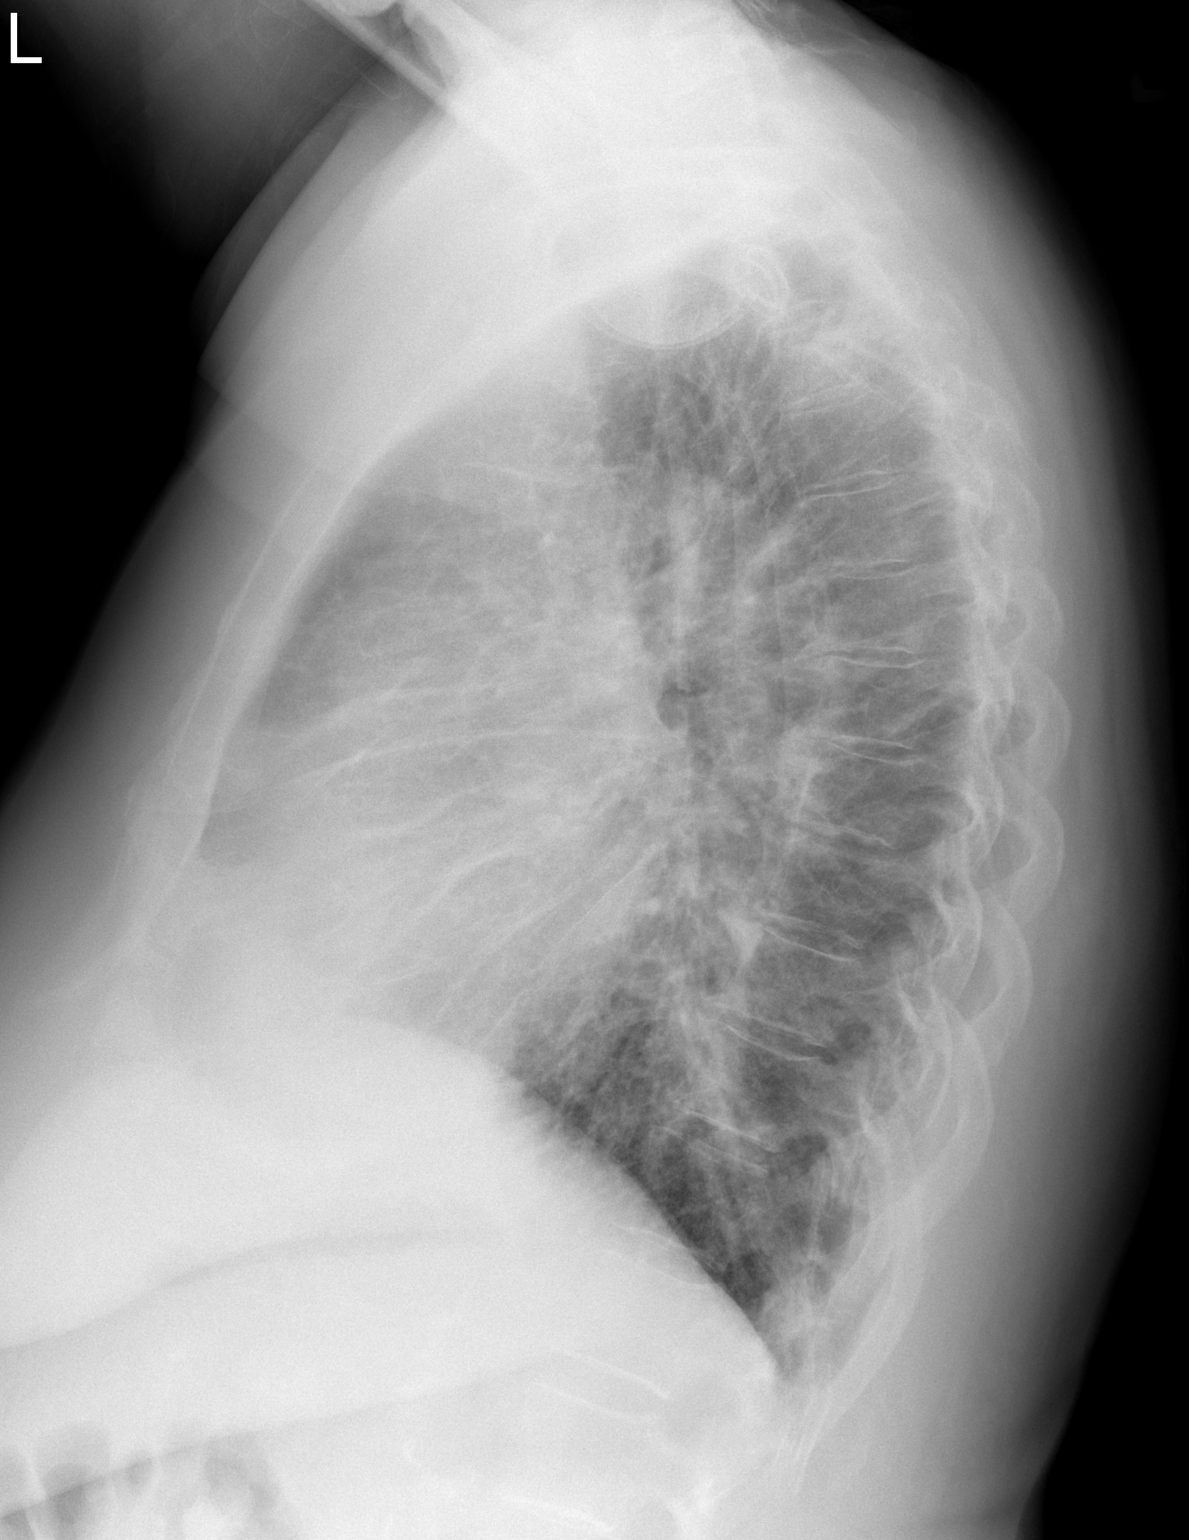

[2 of 2 positions shown; findings below may reference images not displayed]

FINDINGS: Mild interstitial prominence. No pleural effusion or pneumothorax.
Cardiomediastinal contours are within normal limits. Mild calcified
plaque along the aortic arch. No acute osseous abnormality.
IMPRESSION: Age-indeterminate mild interstitial prominence, which may reflect
chronic changes or mild interstitial edema.

## 2022-03-09 IMAGING — DX DG CHEST 1V PORT
1 series · 1 of 1 positions shown · non-contrast
Comparison: June 19, 2020

CLINICAL DATA: Swan-Ganz catheter removal.

EXAM:
PORTABLE CHEST 1 VIEW

[chest]
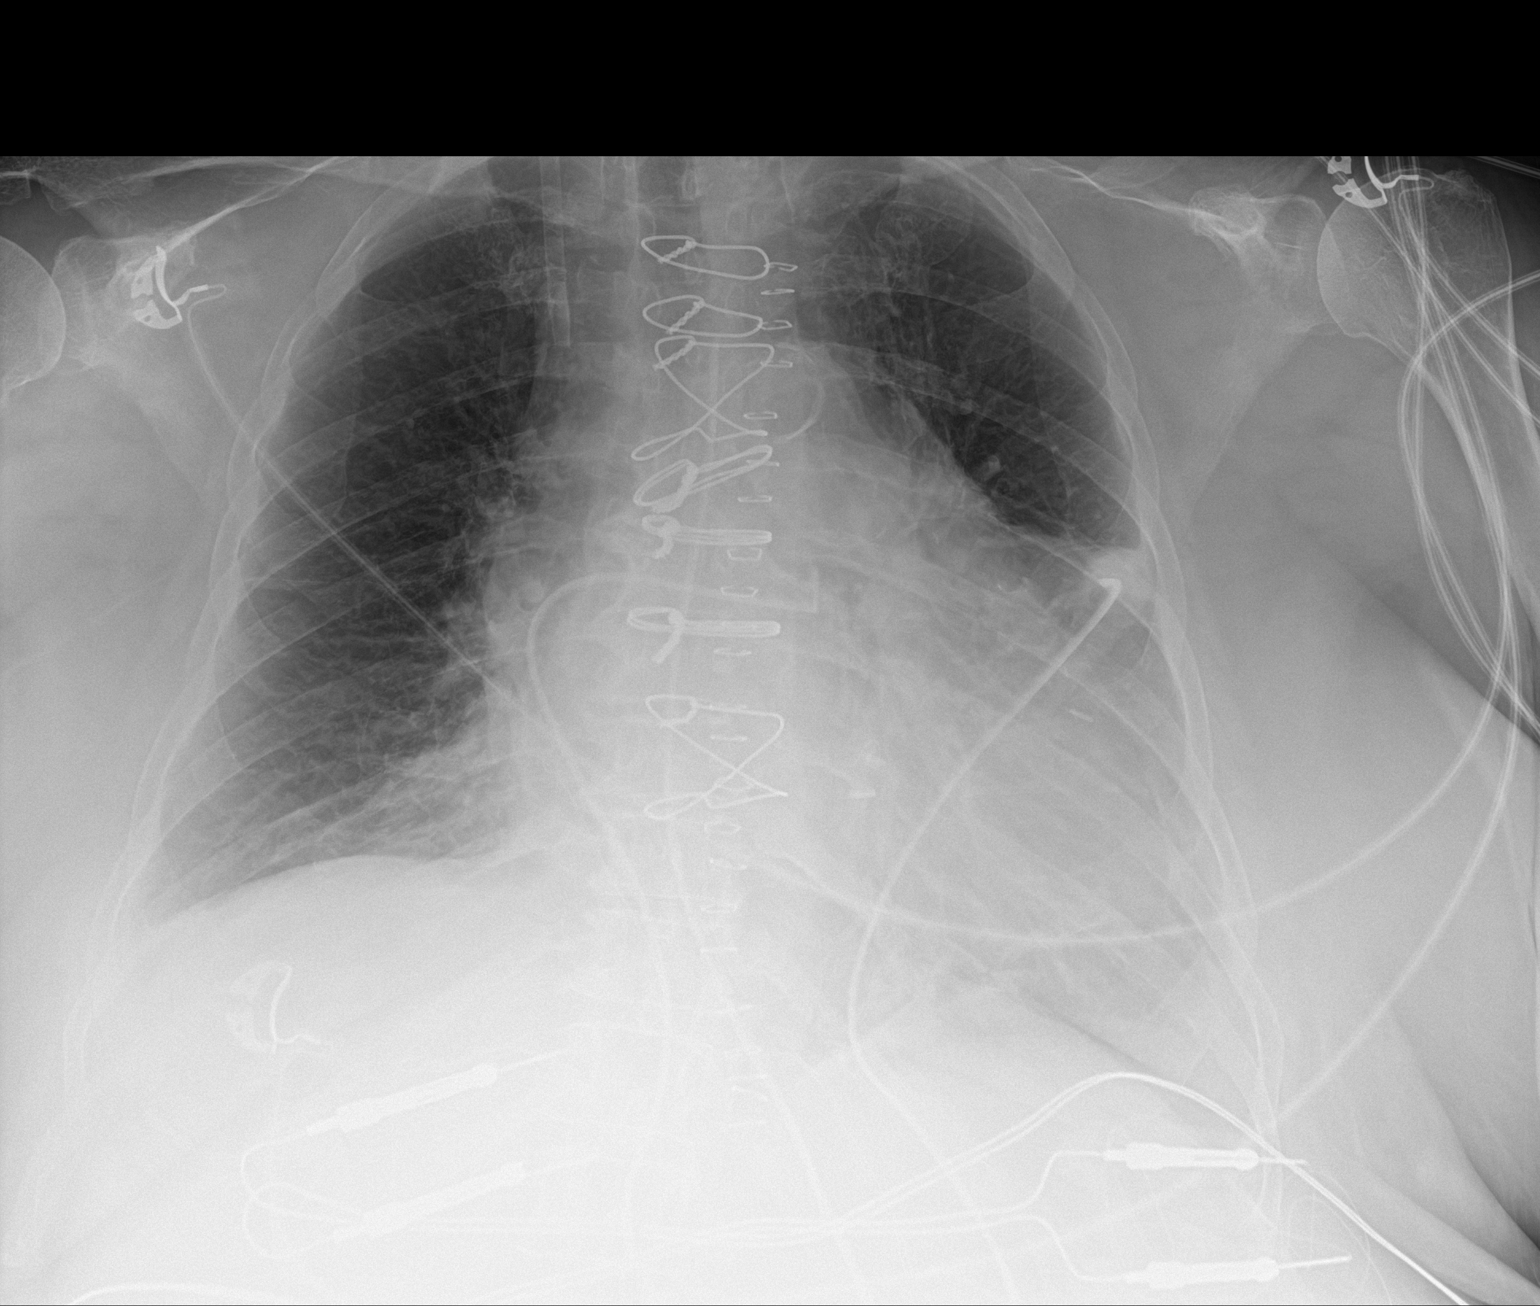

[1 of 1 positions shown; findings below may reference images not displayed]

FINDINGS: Swan-Ganz catheter has been removed. Cordis tip is in the superior
vena cava. Mediastinal drain and left chest tube remain. Temporary
pacemaker wires are attached to the right heart. No pneumothorax.
There is mild bibasilar atelectasis. No edema or airspace opacity.
There is cardiomegaly with pulmonary vascularity normal. No
adenopathy. There is aortic atherosclerosis. Patient is status post
median sternotomy.
IMPRESSION: Tube and catheter positions as described without pneumothorax.
Bibasilar atelectasis. Stable cardiac prominence.

Aortic Atherosclerosis ([id number]).

## 2022-03-10 IMAGING — DX DG CHEST 2V
2 series · 2 of 2 positions shown · non-contrast
Comparison: 06/20/2020

CLINICAL DATA: History of open heart surgery.

EXAM:
CHEST - 2 VIEW

[chest lat]
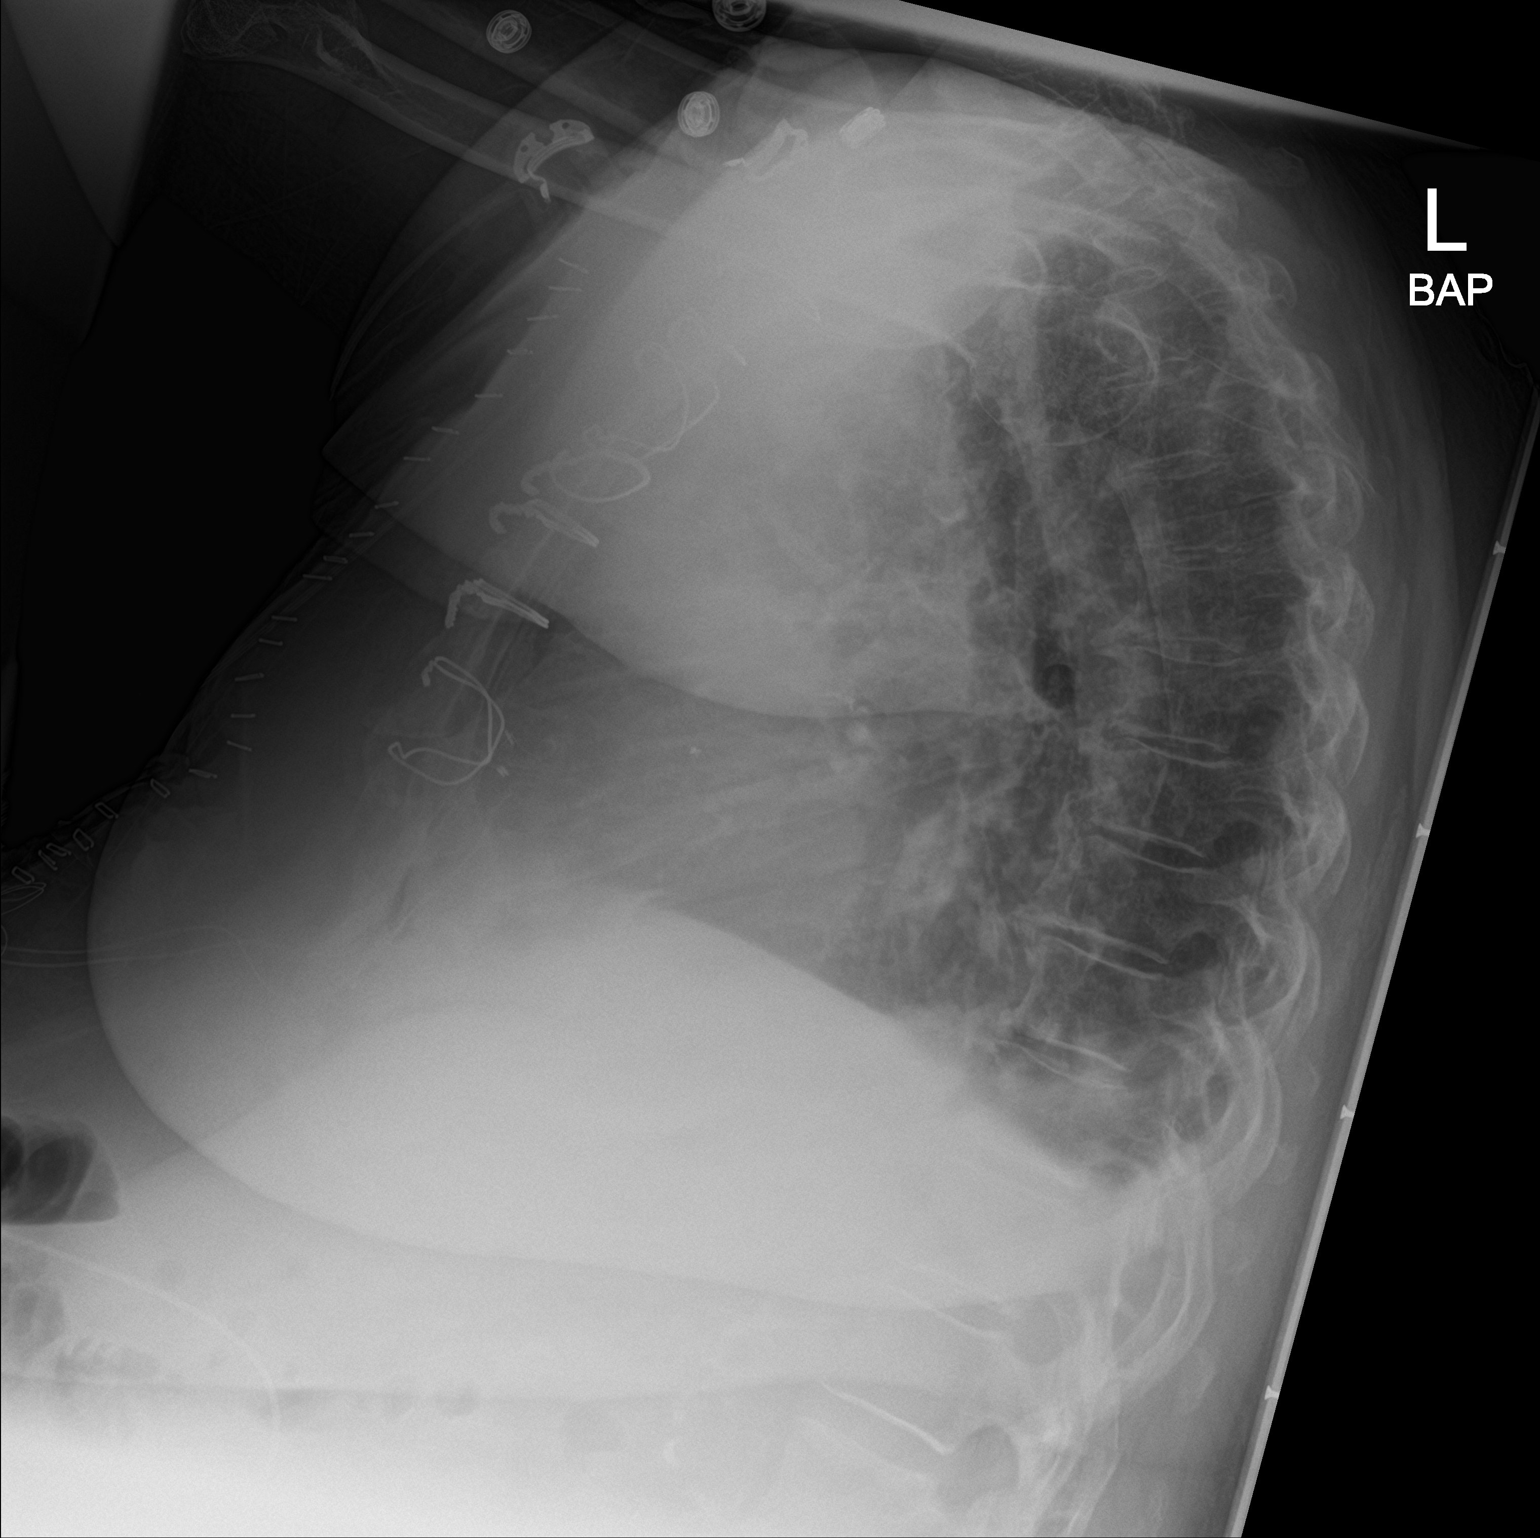

[chest ap]
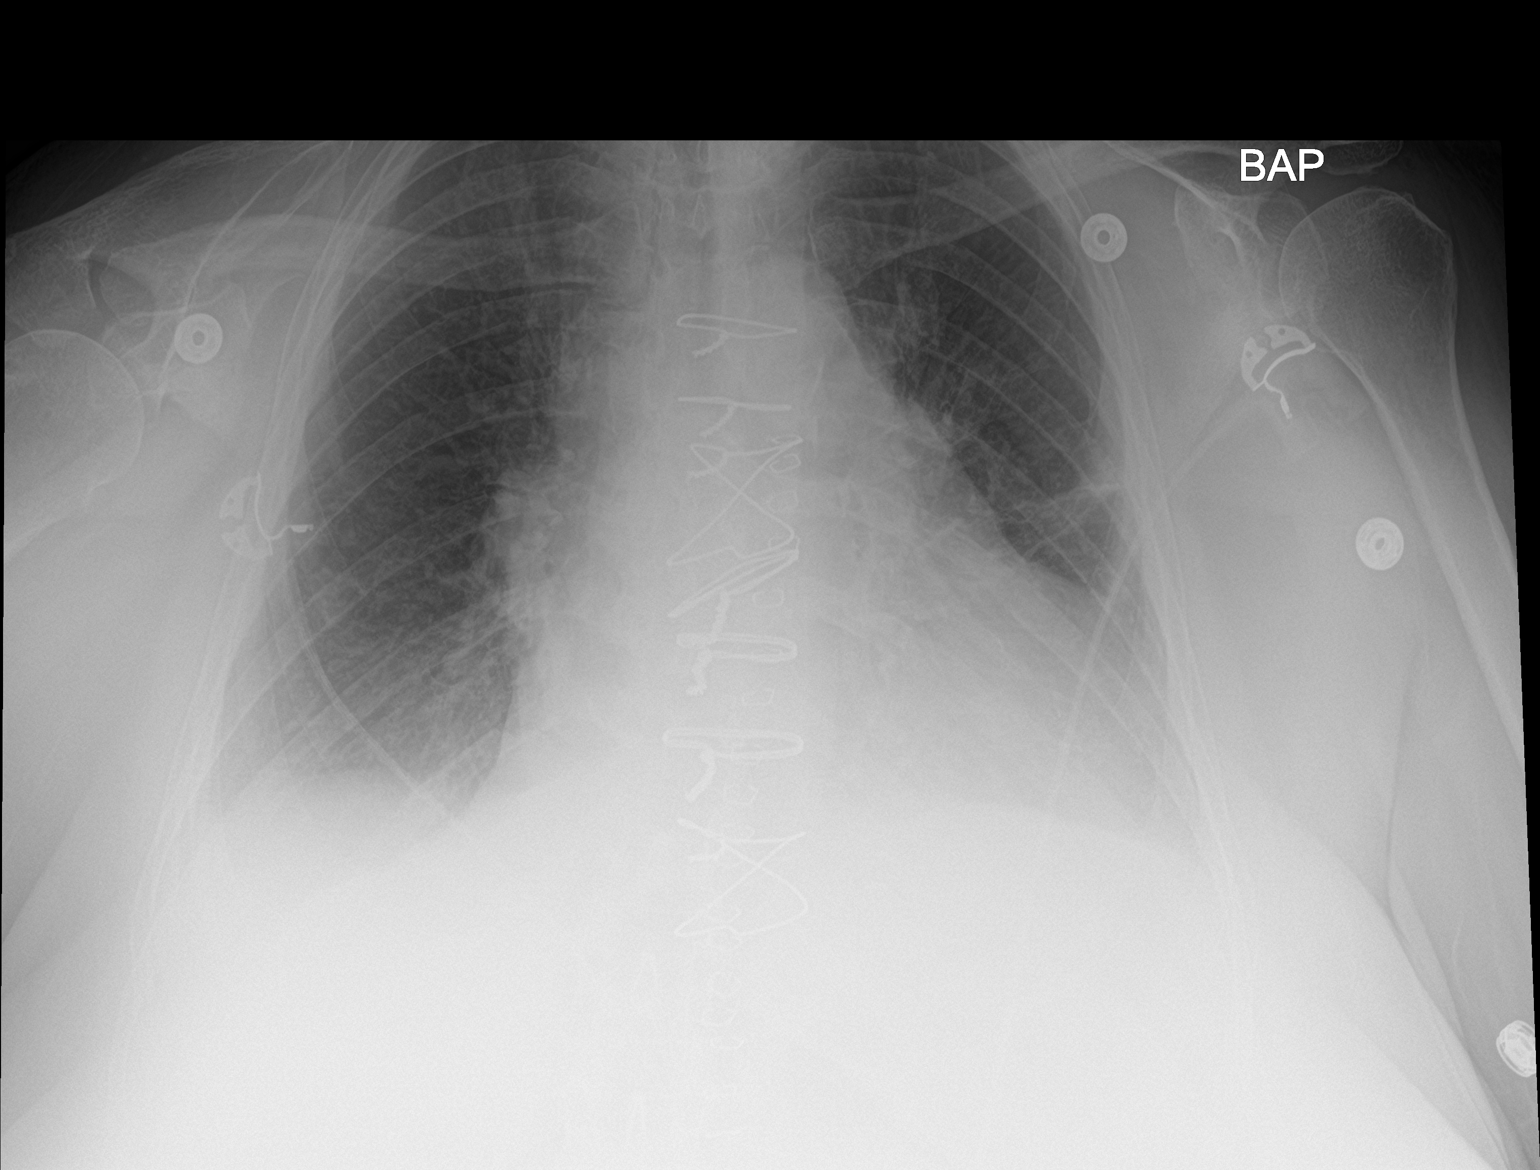

[2 of 2 positions shown; findings below may reference images not displayed]

FINDINGS: Chest tubes and right jugular central line have been removed.
Streaky densities in the mid left lung are suggestive for
atelectasis. Heart size remains enlarged. Prior median sternotomy.
Negative for pneumothorax. Hazy densities at the right lung base. No
evidence for pulmonary edema.
IMPRESSION: 1. Removal of support apparatuses.  Negative for pneumothorax.
2. Hazy densities at the right lung base. Findings are suggestive
for atelectasis. Difficult to exclude trace right pleural fluid.
3. Atelectasis in the mid left lung.

## 2022-05-19 ENCOUNTER — Other Ambulatory Visit: Payer: Self-pay | Admitting: Family Medicine

## 2022-05-25 ENCOUNTER — Other Ambulatory Visit: Payer: Self-pay

## 2022-05-25 DIAGNOSIS — R195 Other fecal abnormalities: Secondary | ICD-10-CM | POA: Insufficient documentation

## 2022-06-09 ENCOUNTER — Other Ambulatory Visit: Payer: Self-pay | Admitting: Family Medicine

## 2022-07-04 ENCOUNTER — Other Ambulatory Visit: Payer: Self-pay

## 2022-07-14 ENCOUNTER — Ambulatory Visit
Admission: RE | Admit: 2022-07-14 | Payer: Medicare (Managed Care) | Source: Ambulatory Visit | Admitting: Student in an Organized Health Care Education/Training Program

## 2022-07-14 ENCOUNTER — Encounter: Admission: RE | Payer: Self-pay | Source: Ambulatory Visit

## 2022-07-14 DIAGNOSIS — R195 Other fecal abnormalities: Secondary | ICD-10-CM | POA: Insufficient documentation

## 2022-07-14 SURGERY — DONT USE, USE 1094-COLONOSCOPY, DIAGNOSTIC (SCREENING)
Anesthesia: Choice | Site: Anus

## 2023-03-16 ENCOUNTER — Emergency Department (HOSPITAL_BASED_OUTPATIENT_CLINIC_OR_DEPARTMENT_OTHER)
Admission: EM | Admit: 2023-03-16 | Discharge: 2023-03-16 | Disposition: A | Discharge status: Home / Self Care | Payer: Medicare HMO | Attending: Emergency Medicine | Admitting: Emergency Medicine

## 2023-03-16 ENCOUNTER — Encounter (HOSPITAL_BASED_OUTPATIENT_CLINIC_OR_DEPARTMENT_OTHER): Payer: Self-pay | Admitting: Emergency Medicine

## 2023-03-16 ENCOUNTER — Other Ambulatory Visit: Payer: Self-pay

## 2023-03-16 DIAGNOSIS — I110 Hypertensive heart disease with heart failure: Secondary | ICD-10-CM | POA: Insufficient documentation

## 2023-03-16 DIAGNOSIS — Z951 Presence of aortocoronary bypass graft: Secondary | ICD-10-CM | POA: Insufficient documentation

## 2023-03-16 DIAGNOSIS — Z7984 Long term (current) use of oral hypoglycemic drugs: Secondary | ICD-10-CM | POA: Diagnosis not present

## 2023-03-16 DIAGNOSIS — Z7902 Long term (current) use of antithrombotics/antiplatelets: Secondary | ICD-10-CM | POA: Insufficient documentation

## 2023-03-16 DIAGNOSIS — Z79899 Other long term (current) drug therapy: Secondary | ICD-10-CM | POA: Insufficient documentation

## 2023-03-16 DIAGNOSIS — I509 Heart failure, unspecified: Secondary | ICD-10-CM | POA: Diagnosis not present

## 2023-03-16 DIAGNOSIS — H538 Other visual disturbances: Secondary | ICD-10-CM | POA: Diagnosis present

## 2023-03-16 DIAGNOSIS — H04302 Unspecified dacryocystitis of left lacrimal passage: Secondary | ICD-10-CM | POA: Diagnosis not present

## 2023-03-16 DIAGNOSIS — E119 Type 2 diabetes mellitus without complications: Secondary | ICD-10-CM | POA: Insufficient documentation

## 2023-03-16 DIAGNOSIS — Z7982 Long term (current) use of aspirin: Secondary | ICD-10-CM | POA: Diagnosis not present

## 2023-03-16 MED ORDER — FLUORESCEIN SODIUM 1 MG OP STRP
1.0000 | ORAL_STRIP | Freq: Once | OPHTHALMIC | Status: AC
  Administered 2023-03-16: 1 via OPHTHALMIC
  Filled 2023-03-16: qty 1

## 2023-03-16 MED ORDER — CETIRIZINE HCL 10 MG PO TABS: 10.0000 mg | ORAL_TABLET | Freq: Every day | ORAL | 0 refills | Status: AC

## 2023-03-16 MED ORDER — TETRACAINE HCL 0.5 % OP SOLN
2.0000 [drp] | Freq: Once | OPHTHALMIC | Status: AC
  Administered 2023-03-16: 2 [drp] via OPHTHALMIC
  Filled 2023-03-16: qty 4

## 2023-03-16 NOTE — Discharge Instructions (Addendum)
Your symptoms are most likely related to inflammation of your lacrimal duct. For this, apply warm compresses to your eye for relief and take Tylenol every 4-6 hours as needed for pain relief. Additionally, I have prescribed Zyrtec for you to take daily.  Please follow up with your PCP in 3-5 days for reevaluation of symptoms.  Get help right away if you have: Redness, swelling, and pain that spread to the tissues around your eye. A sudden decrease in your vision.

## 2023-03-16 NOTE — ED Notes (Signed)
ED Provider at bedside. 

## 2023-03-16 NOTE — ED Triage Notes (Signed)
Pt POV steady gait, c/o swollen L eyelid x2 days, progressively worsening.  Denies drainage.  Reports blurred vision in left eye.

## 2023-03-16 NOTE — ED Provider Notes (Signed)
[facility] Provider Note   CSN: [id number] Arrival date & time: 03/16/23  1646     History  Chief Complaint  Patient presents with   Eye Problem    Angela Frazier is a 77 y.o. female with a history of NSTEMI, CABG, HF, hypertension, diabetes mellitus, and dry eye who presents to the ED today for vision changes. Patient reports her swelling to her left upper and lower eyelid for the past 2 days as well as blurred vision of the left eye. Patient reports that she was      Home Medications Prior to Admission medications   Medication Sig Start Date End Date Taking? Authorizing Provider  cetirizine (ZYRTEC ALLERGY) 10 MG tablet Take 1 tablet (10 mg total) by mouth daily. 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    [provider]  albuterol (PROVENTIL HFA;VENTOLIN HFA) 108 (90 Base) MCG/ACT inhaler Inhale 2 puffs into the lungs every 6 (six) hours as needed for wheezing or shortness of breath.    [provider]  aspirin 81 MG tablet Take 81 mg by mouth daily.    [provider]  atorvastatin (LIPITOR) 80 MG tablet Take 1 tablet (80 mg total) by mouth daily. 06/25/20   Sharlene Dory, PA-C  Cholecalciferol (VITAMIN D) 50 MCG (2000 UT) CAPS Take 2,000 Units by mouth daily.    [provider]  clopidogrel (PLAVIX) 75 MG tablet Take 1 tablet (75 mg total) by mouth daily. 06/25/20   Sharlene Dory, PA-C  Cyanocobalamin (B-12 PO) Take 1 tablet by mouth daily.    [provider]  dapagliflozin propanediol (FARXIGA) 10 MG TABS tablet Take 10 mg by mouth daily. 1 Tablet Daily    [provider]  EASY COMFORT PEN NEEDLES 31G X 6 MM MISC SMARTSIG:Injection Twice Daily  07/14/20   [provider]  escitalopram (LEXAPRO) 20 MG tablet Take 20 mg by mouth daily. 05/05/20   [provider]  fluticasone (FLONASE) 50 MCG/ACT nasal spray Place 1 spray into both nostrils daily.    [provider]  furosemide (LASIX) 40 MG tablet TAKE 1 TABLET BY MOUTH EVERY DAY 09/10/20   Marykay Lex, MD  gabapentin (NEURONTIN) 100 MG capsule Take 300 mg by mouth at bedtime.    [provider]  Lancets [facility] ULTRASOFT) lancets 4 (four) times daily. 07/14/20   [provider]  levothyroxine (SYNTHROID) 137 MCG tablet Take 137 mcg by mouth daily. 03/16/20   [provider]  LORazepam (ATIVAN) 1 MG tablet TAKE 1 AND 1/4 TABLET BY MOUTH EVERY NIGHT AT BEDTIME 08/19/20   [provider]  magnesium oxide (MAG-OX) 400 MG tablet Take 400 mg by mouth daily.    [provider]  metFORMIN (GLUCOPHAGE-XR) 500 MG 24 hr tablet Take 1,000 mg by mouth 2 (two) times daily. 05/26/20   [provider]  metoprolol succinate (TOPROL XL) 50 MG 24 hr tablet Take 1 tablet (50 mg total) by mouth daily. 06/25/20 06/25/21  Marcelino Duster, PA  montelukast (SINGULAIR) 10 MG tablet Take 10 mg by mouth at bedtime. 05/19/20   [provider]  NOVOLOG MIX 70/30 FLEXPEN (70-30) 100 UNIT/ML FlexPen Inject 0-70 Units into  the skin 2 (two) times daily. 06/10/20   [provider]  polyvinyl alcohol (LIQUIFILM TEARS) 1.4 % ophthalmic solution Place 1 drop into both eyes as needed for dry eyes.    [provider]  sacubitril-valsartan (ENTRESTO) 24-26 MG Take 1 tablet by mouth 2 (two) times daily. 06/13/21   Marykay Lex, MD  spironolactone (ALDACTONE) 25 MG tablet Take 25 mg by mouth daily.    [provider]  SYMBICORT 80-4.5 MCG/ACT inhaler Inhale 2 puffs into the lungs daily. 03/17/20   [provider]  isosorbide dinitrate (ISORDIL) 20 MG tablet Take 1 tablet (20 mg total) by mouth 3  (three) times daily. 06/25/20 07/14/20  Sharlene Dory, PA-C  potassium chloride SA (KLOR-CON) 20 MEQ tablet Take 1 tablet (20 mEq total) by mouth daily. 06/25/20 07/14/20  Sharlene Dory, PA-C      Allergies    Cefaclor, Codeine, Penicillins, Sulfa antibiotics, Trazodone and nefazodone, Vibramycin [doxycycline calcium], and Erythromycin    Review of Systems   Review of Systems  Eyes:        Left eye blurred vision  All other systems reviewed and are negative.   Physical Exam Updated Vital Signs BP (!) 156/55   Pulse (!) 48   Temp 97.8 F (36.6 C)   Resp 15   Ht 5\' 5"  (1.651 m)   Wt 99.8 kg   SpO2 97%   BMI 36.61 kg/m  Physical Exam Vitals and nursing note reviewed.  Constitutional:      Appearance: Normal appearance.  HENT:     Head: Normocephalic and atraumatic.     Mouth/Throat:     Mouth: Mucous membranes are moist.  Eyes:     General:        Right eye: No discharge.        Left eye: No discharge.     Extraocular Movements: Extraocular movements intact.     Conjunctiva/sclera: Conjunctivae normal.     Pupils: Pupils are equal, round, and reactive to light.     Comments: Swelling of the upper and lower left eyelid appreciated without tenderness to touch. No corneal injection.  Cardiovascular:     Rate and Rhythm: Normal rate and regular rhythm.     Pulses: Normal pulses.  Pulmonary:     Effort: Pulmonary effort is normal.     Breath sounds: Normal breath sounds.  Abdominal:     Palpations: Abdomen is soft.     Tenderness: There is no abdominal tenderness.  Skin:    General: Skin is warm and dry.     Findings: No rash.  Neurological:     General: No focal deficit present.     Mental Status: She is alert.     Sensory: No sensory deficit.     Motor: No weakness.  Psychiatric:        Mood and Affect: Mood normal.        Behavior: Behavior normal.     ED Results / Procedures / Treatments   Labs (all labs ordered are listed, but only abnormal results are  displayed) Labs Reviewed - No data to display  EKG None  Radiology No results found.  Procedures Procedures: not indicated.   Medications Ordered in ED Medications  tetracaine (PONTOCAINE) 0.5 % ophthalmic solution 2 drop (2 drops Left Eye Given by Other 03/16/23 1706)  fluorescein ophthalmic strip 1 strip (1 strip Left Eye Given by Other 03/16/23 1707)    ED Course/ Medical Decision Making/ A&P  Clinical Course as of 03/16/23 2254  Fri Mar 16, 2023  1721 Stable 35 YOF with difficulty with seeing out of her left eye Feels swelling and difficulty with vision.  [CC]    Clinical Course User Index [CC] Glyn Ade, MD                                 Medical Decision Making Risk OTC drugs. Prescription drug management.   This patient presents to the ED for concern of blurred vision, this involves an extensive number of treatment options, and is a complaint that carries with it a high risk of complications and morbidity.   Differential diagnosis includes: acute vs chronic angle glaucoma, CRAO, CRVO, allergic conjunctivitis, corneal abrasion vs ulceration, chalazion, hordeolum, etc.   Comorbidities  See HPI above   Additional History  Additional history obtained from previous PCP and ophthalmology notes.   Problem List / ED Course / Critical Interventions / Medication Management  Blurred vision in left eye with eyelid swelling I discussed this case with my attending, Dr. Doran Durand, and he examined the patient as well. He performed fluorescein stain and the woods lamp to further examine the eye. Eye pressure WNL. I have reviewed the patients home medicines and have made adjustments as needed   Social Determinants of Health  Physical activity   Test / Admission - Considered  Patient is hemodynamically stable and safe for discharge home. Return precautions provided.        Final Clinical Impression(s) / ED Diagnoses Final diagnoses:   Dacrocystitis, left    Rx / DC Orders ED Discharge Orders          Ordered    cetirizine (ZYRTEC ALLERGY) 10 MG tablet  Daily        03/16/23 1744              Maxwell Marion, PA-C 03/16/23 2254    Glyn Ade, MD 03/19/23 1739

## 2023-05-11 ENCOUNTER — Other Ambulatory Visit: Payer: Self-pay | Admitting: Family Medicine

## 2023-05-15 ENCOUNTER — Other Ambulatory Visit: Payer: Self-pay | Admitting: Family Medicine

## 2024-06-06 ENCOUNTER — Other Ambulatory Visit: Payer: Self-pay | Admitting: Family Medicine

## 2024-06-27 ENCOUNTER — Other Ambulatory Visit: Payer: Self-pay | Admitting: Family Medicine
